# Patient Record
Sex: Female | Born: 1945 | Race: White | Hispanic: No | Smoking: Never smoker
Health system: Southern US, Community
[De-identification: ages and names within clinical notes are randomized; demographics above are authoritative.]

## PROBLEM LIST (undated history)

## (undated) DIAGNOSIS — I350 Nonrheumatic aortic (valve) stenosis: Secondary | ICD-10-CM

## (undated) DIAGNOSIS — I10 Essential (primary) hypertension: Secondary | ICD-10-CM

## (undated) DIAGNOSIS — I2510 Atherosclerotic heart disease of native coronary artery without angina pectoris: Secondary | ICD-10-CM

## (undated) DIAGNOSIS — E785 Hyperlipidemia, unspecified: Secondary | ICD-10-CM

## (undated) DIAGNOSIS — G629 Polyneuropathy, unspecified: Secondary | ICD-10-CM

## (undated) DIAGNOSIS — K219 Gastro-esophageal reflux disease without esophagitis: Secondary | ICD-10-CM

## (undated) DIAGNOSIS — E119 Type 2 diabetes mellitus without complications: Secondary | ICD-10-CM

## (undated) HISTORY — DX: Hyperlipidemia, unspecified: E78.5

## (undated) HISTORY — PX: OTHER SURGICAL HISTORY: SHX169

## (undated) HISTORY — DX: Nonrheumatic aortic (valve) stenosis: I35.0

## (undated) HISTORY — DX: Gastro-esophageal reflux disease without esophagitis: K21.9

## (undated) HISTORY — DX: Atherosclerotic heart disease of native coronary artery without angina pectoris: I25.10

## (undated) HISTORY — DX: Polyneuropathy, unspecified: G62.9

## (undated) HISTORY — DX: Type 2 diabetes mellitus without complications: E11.9

## (undated) HISTORY — DX: Essential (primary) hypertension: I10

---

## 1988-09-20 HISTORY — PX: ABDOMINAL HYSTERECTOMY: SHX81

## 1989-05-21 HISTORY — PX: OTHER SURGICAL HISTORY: SHX169

## 1989-05-21 HISTORY — PX: GALLBLADDER SURGERY: SHX652

## 1998-06-26 ENCOUNTER — Ambulatory Visit (HOSPITAL_COMMUNITY): Admission: RE | Admit: 1998-06-26 | Discharge: 1998-06-26 | Payer: Self-pay | Admitting: Orthopedic Surgery

## 1998-06-26 ENCOUNTER — Encounter: Payer: Self-pay | Admitting: Orthopedic Surgery

## 1998-07-11 ENCOUNTER — Inpatient Hospital Stay (HOSPITAL_COMMUNITY): Admission: AD | Admit: 1998-07-11 | Discharge: 1998-07-17 | Payer: Self-pay | Admitting: Cardiology

## 1998-10-02 ENCOUNTER — Encounter: Payer: Self-pay | Admitting: Cardiology

## 1998-10-02 ENCOUNTER — Ambulatory Visit (HOSPITAL_COMMUNITY): Admission: RE | Admit: 1998-10-02 | Discharge: 1998-10-02 | Payer: Self-pay | Admitting: Cardiology

## 2001-05-10 ENCOUNTER — Emergency Department (HOSPITAL_COMMUNITY): Admission: EM | Admit: 2001-05-10 | Discharge: 2001-05-10 | Payer: Self-pay | Admitting: Emergency Medicine

## 2001-05-10 ENCOUNTER — Encounter: Payer: Self-pay | Admitting: Emergency Medicine

## 2001-06-16 ENCOUNTER — Ambulatory Visit (HOSPITAL_COMMUNITY): Admission: RE | Admit: 2001-06-16 | Discharge: 2001-06-16 | Payer: Self-pay | Admitting: Cardiology

## 2001-06-16 ENCOUNTER — Encounter: Payer: Self-pay | Admitting: Cardiology

## 2002-05-08 ENCOUNTER — Encounter: Payer: Self-pay | Admitting: Internal Medicine

## 2002-05-08 ENCOUNTER — Encounter: Admission: RE | Admit: 2002-05-08 | Discharge: 2002-05-08 | Payer: Self-pay | Admitting: Internal Medicine

## 2006-01-17 ENCOUNTER — Encounter: Admission: RE | Admit: 2006-01-17 | Discharge: 2006-01-17 | Payer: Self-pay | Admitting: Cardiology

## 2006-01-21 ENCOUNTER — Inpatient Hospital Stay (HOSPITAL_COMMUNITY): Admission: RE | Admit: 2006-01-21 | Discharge: 2006-01-30 | Payer: Self-pay | Admitting: Cardiology

## 2006-01-24 HISTORY — PX: CORONARY ARTERY BYPASS GRAFT: SHX141

## 2006-02-24 ENCOUNTER — Encounter: Admission: RE | Admit: 2006-02-24 | Discharge: 2006-02-24 | Payer: Self-pay | Admitting: Cardiothoracic Surgery

## 2010-12-18 ENCOUNTER — Other Ambulatory Visit (HOSPITAL_COMMUNITY): Payer: Self-pay | Admitting: Family Medicine

## 2010-12-18 DIAGNOSIS — R918 Other nonspecific abnormal finding of lung field: Secondary | ICD-10-CM

## 2010-12-28 ENCOUNTER — Inpatient Hospital Stay (HOSPITAL_COMMUNITY): Admission: RE | Admit: 2010-12-28 | Payer: Self-pay | Source: Ambulatory Visit

## 2010-12-28 ENCOUNTER — Inpatient Hospital Stay (HOSPITAL_COMMUNITY)
Admission: RE | Admit: 2010-12-28 | Discharge: 2010-12-28 | Payer: Self-pay | Source: Ambulatory Visit | Attending: Family Medicine | Admitting: Family Medicine

## 2012-10-09 ENCOUNTER — Other Ambulatory Visit (HOSPITAL_COMMUNITY): Payer: Self-pay | Admitting: Internal Medicine

## 2012-10-09 DIAGNOSIS — I10 Essential (primary) hypertension: Secondary | ICD-10-CM

## 2012-10-09 DIAGNOSIS — R011 Cardiac murmur, unspecified: Secondary | ICD-10-CM

## 2012-10-09 DIAGNOSIS — E785 Hyperlipidemia, unspecified: Secondary | ICD-10-CM

## 2012-10-19 ENCOUNTER — Ambulatory Visit (HOSPITAL_COMMUNITY): Payer: Self-pay

## 2012-10-21 DIAGNOSIS — I350 Nonrheumatic aortic (valve) stenosis: Secondary | ICD-10-CM

## 2012-10-21 HISTORY — DX: Nonrheumatic aortic (valve) stenosis: I35.0

## 2012-10-24 ENCOUNTER — Ambulatory Visit (HOSPITAL_COMMUNITY)
Admission: RE | Admit: 2012-10-24 | Discharge: 2012-10-24 | Disposition: A | Discharge status: Home / Self Care | Payer: Medicare Other | Source: Ambulatory Visit | Attending: Internal Medicine | Admitting: Internal Medicine

## 2012-10-24 DIAGNOSIS — I10 Essential (primary) hypertension: Secondary | ICD-10-CM | POA: Insufficient documentation

## 2012-10-24 DIAGNOSIS — I2510 Atherosclerotic heart disease of native coronary artery without angina pectoris: Secondary | ICD-10-CM | POA: Insufficient documentation

## 2012-10-24 DIAGNOSIS — I080 Rheumatic disorders of both mitral and aortic valves: Secondary | ICD-10-CM | POA: Insufficient documentation

## 2012-10-24 DIAGNOSIS — E119 Type 2 diabetes mellitus without complications: Secondary | ICD-10-CM | POA: Insufficient documentation

## 2012-10-24 DIAGNOSIS — R011 Cardiac murmur, unspecified: Secondary | ICD-10-CM | POA: Insufficient documentation

## 2012-10-24 DIAGNOSIS — I369 Nonrheumatic tricuspid valve disorder, unspecified: Secondary | ICD-10-CM | POA: Insufficient documentation

## 2012-10-24 DIAGNOSIS — E785 Hyperlipidemia, unspecified: Secondary | ICD-10-CM

## 2012-10-24 NOTE — Progress Notes (Signed)
2D Echo Performed 10/24/2012    Keir Viernes, RCS  

## 2012-12-21 DIAGNOSIS — M25569 Pain in unspecified knee: Secondary | ICD-10-CM

## 2012-12-21 DIAGNOSIS — M545 Low back pain, unspecified: Secondary | ICD-10-CM | POA: Insufficient documentation

## 2012-12-21 HISTORY — DX: Low back pain, unspecified: M54.50

## 2012-12-21 HISTORY — DX: Pain in unspecified knee: M25.569

## 2013-03-26 ENCOUNTER — Telehealth: Payer: Self-pay | Admitting: Internal Medicine

## 2013-03-26 NOTE — Telephone Encounter (Signed)
Returned call.  Pt stated she has had a virus over the weekend.  Stated starting Saturday, she can hear and feel her heartbeat in her ear and she hasn't heard that since 2007 when she had open heart surgery.  Stated she didn't know if it was r/t the virus or if it was her heart.  Offered appt this afternoon w/ NP and pt declined.  Stated she has a lot to do today and would rather come in tomorrow.  Informed NP will be notified this afternoon and RN will call back once response given.  Pt verbalized understanding and agreed w/ plan.  Chart# [id number].

## 2013-03-26 NOTE — Telephone Encounter (Signed)
Message forwarded to L. Ingold, NP for further instructions.  

## 2013-03-26 NOTE — Telephone Encounter (Signed)
Returned call and informed pt per instructions by MD/PA.  Pt verbalized understanding and agreed w/ plan.  Appt scheduled for 7.8.14 at 10:20am.  Pt agreed to go to ER for worsening symptoms.

## 2013-03-26 NOTE — Telephone Encounter (Signed)
Pt called in stating that she has not felt well and she said that she can hear her heartbeat and it is bothering her. She wanted to know if she needed to come in for an appointment or what could she do about it.

## 2013-03-26 NOTE — Telephone Encounter (Signed)
Can come tomorrow to be seen.  If she becomes worse before then go to ER.

## 2013-03-27 ENCOUNTER — Ambulatory Visit (INDEPENDENT_AMBULATORY_CARE_PROVIDER_SITE_OTHER): Payer: Medicare Other | Admitting: Cardiology

## 2013-03-27 ENCOUNTER — Telehealth: Payer: Self-pay | Admitting: Cardiology

## 2013-03-27 ENCOUNTER — Encounter: Payer: Self-pay | Admitting: Cardiology

## 2013-03-27 VITALS — BP 140/77 | HR 82 | Ht 63.0 in | Wt 184.2 lb

## 2013-03-27 DIAGNOSIS — R079 Chest pain, unspecified: Secondary | ICD-10-CM

## 2013-03-27 DIAGNOSIS — R42 Dizziness and giddiness: Secondary | ICD-10-CM

## 2013-03-27 DIAGNOSIS — R195 Other fecal abnormalities: Secondary | ICD-10-CM

## 2013-03-27 DIAGNOSIS — I2510 Atherosclerotic heart disease of native coronary artery without angina pectoris: Secondary | ICD-10-CM

## 2013-03-27 HISTORY — DX: Other fecal abnormalities: R19.5

## 2013-03-27 HISTORY — DX: Dizziness and giddiness: R42

## 2013-03-27 LAB — BASIC METABOLIC PANEL WITH GFR
BUN: 12 mg/dL (ref 6–23)
CO2: 30 mEq/L (ref 19–32)
Chloride: 104 mEq/L (ref 96–112)
Creat: 0.7 mg/dL (ref 0.50–1.10)
Glucose, Bld: 118 mg/dL — ABNORMAL HIGH (ref 70–99)

## 2013-03-27 LAB — CBC WITH DIFFERENTIAL/PLATELET
Lymphs Abs: 3.1 10*3/uL (ref 0.7–4.0)
MCH: 30.9 pg (ref 26.0–34.0)
MCHC: 31.9 g/dL (ref 30.0–36.0)
MCV: 96.7 fL (ref 78.0–100.0)
Neutro Abs: 7.4 10*3/uL (ref 1.7–7.7)
Neutrophils Relative %: 68 % (ref 43–77)
RDW: 16.9 % — ABNORMAL HIGH (ref 11.5–15.5)
WBC: 10.8 10*3/uL — ABNORMAL HIGH (ref 4.0–10.5)

## 2013-03-27 MED ORDER — MECLIZINE HCL 12.5 MG PO TABS: 12.5000 mg | ORAL_TABLET | Freq: Three times a day (TID) | ORAL | Status: DC | PRN

## 2013-03-27 NOTE — Assessment & Plan Note (Signed)
With viral syndrome this weekend + N,V, diarrhea, and at least one episode of black stool she thought could be Imodium but she  Did not take pepto bismol. Will check CBC

## 2013-03-27 NOTE — Assessment & Plan Note (Signed)
She felt her heart beating in her ears like when she had CABG.  Does have a pounding HR But dizziness associated with movement and recent viral syndrome.  Will add meclizine to her meds.  Will also check labs, she is pale and had black stool on Sat.  If CBC stable and meclizine does not help we will add cardionet monitor. Currently in SR with the symptoms.

## 2013-03-27 NOTE — Telephone Encounter (Signed)
Hgb is 7.7 today, which explains her symptoms.  I have called her home and left a message for her to call us.  I did discuss with Dr. Rennis Golden and we would like her to go to ER. They can then transfuse her.  The closest hospital would be fine, doesn't have to be [facility].   Please try the pt again today.

## 2013-03-27 NOTE — Telephone Encounter (Signed)
I called the [facility]- emergency contact number- and Ms. Bos has gone to [facility] for further treatment per Mr. Aggie Cosier.

## 2013-03-27 NOTE — Progress Notes (Signed)
03/27/2013   PCP: Lise Auer, MD   Chief Complaint  Patient presents with  . Follow-up    feeling like heart is beating in her head, lots of dizziness, and little chest pain, off balance.    Primary Cardiologist:Dr. Hilty  HPI:  67 year old female patient is here today with complaints of hearing her heartbeat in her head.  She also has a lot of dizziness, though she cannot express it was room spinning or just lightheadedness.  Symptoms started last Friday with nausea vomiting and diarrhea he continued through Saturday. She did have one episode of black stool she thought was related to be Imodium. She denies any use of Pepto-Bismol. The dizziness and hearing her heartbeat began Sunday night Monday. She was concerned this may be cardiac the last time she remembers the symptoms were after her bypass grafting.  She has minimal chest pain under her right breast that lasts for just moments.  She may have had mild fever Friday and Saturday with her nausea vomiting and diarrhea.  She has a history of coronary artery disease with bypass grafting in 2007 by Dr. Zenaida Niece try with LIMA to the LAD a vein graft to the diagonal vein graft to the RCA. Last stress test was 2000 1010 a nuclear test that showed an EF 70% no ischemia. Other history as listed below.  On her last visit with Dr. Rennis Golden she was noted to have 2-3/6 systolic murmur and echocardiogram was done.  She had mild aortic stenosis and mild mitral regurg.   Allergies  Allergen Reactions  . Other     Nuts   . Penicillins     Current Outpatient Prescriptions  Medication Sig Dispense Refill  . ALPRAZolam (XANAX) 0.5 MG tablet Take 1 mg by mouth daily.      Marland Kitchen aspirin 325 MG tablet Take 325 mg by mouth daily.      . cholecalciferol (VITAMIN D) 1000 UNITS tablet Take 1,000 Units by mouth daily.      . CRESTOR 20 MG tablet Take 20 mg by mouth daily.      Marland Kitchen DILTIAZEM HCL CD 360 MG 24 hr capsule Take 360 mg by mouth daily.      . fish  oil-omega-3 fatty acids 1000 MG capsule Take 1 g by mouth 3 (three) times daily.      Marland Kitchen gabapentin (NEURONTIN) 300 MG capsule Take 300 mg by mouth 3 (three) times daily.      Marland Kitchen loratadine (CLARITIN) 10 MG tablet Take 10 mg by mouth daily.      . metFORMIN (GLUCOPHAGE-XR) 750 MG 24 hr tablet Take 750 mg by mouth daily.      . metoprolol (LOPRESSOR) 50 MG tablet Take 50 mg by mouth 3 (three) times daily.      . Multiple Vitamin (MULTIVITAMIN WITH MINERALS) TABS Take 1 tablet by mouth daily.      . nitroGLYCERIN (NITROSTAT) 0.4 MG SL tablet Place 0.4 mg under the tongue every 5 (five) minutes as needed for chest pain.      Marland Kitchen tiZANidine (ZANAFLEX) 4 MG tablet Take 4 mg by mouth as needed.      . traMADol (ULTRAM) 50 MG tablet Take 50 mg by mouth as needed.      . traZODone (DESYREL) 150 MG tablet Take 150 mg by mouth as needed.      . meclizine (ANTIVERT) 12.5 MG tablet Take 1 tablet (12.5 mg total) by mouth 3 (three) times daily  as needed.  30 tablet  0   No current facility-administered medications for this visit.    Past Medical History  Diagnosis Date  . Hypertension   . Hyperlipemia   . Diabetes mellitus   . GERD (gastroesophageal reflux disease)   . CAD (coronary artery disease)     hx CABG 2007, LAST NUC EF 70%-no ischemia  . Aortic stenosis, mild 10/2012    mild AS by Echo , mild MR  . Neuropathy     Past Surgical History  Procedure Laterality Date  . Coronary artery bypass graft  01/24/2006    x 3,LIMA-LAD, VG-diag, VG-distal RCA  . Abdominal hysterectomy  1990  . Gallbladder surgery  1990's  . Knee replacment  1990's  . Lung repair      s/p lap chole    ZOX:WRUEAVW: + N,V diarrhea. no weight changes Skin:no rashes or ulcers HEENT:no blurred vision, some congestion CV:see HPI PUL:see HPI GI:+ diarrhea this past weekend, no constipation, ? melena, no indigestion, still with occ nausea GU:no hematuria, no dysuria MS:no joint pain, no claudication Neuro:no syncope, ++  lightheadedness Endo:+ diabetes, no thyroid disease  PHYSICAL EXAM BP 140/77  Pulse 82  Ht 5\' 3"  (1.6 m)  Wt 184 lb 3.2 oz (83.553 kg)  BMI 32.64 kg/m2 General:Pleasant affect, NAD Skin:Warm and dry, brisk capillary refill, pale HEENT:normocephalic, sclera clear, mucus membranes moist, Wax  obscuring rt. TM, Lt TM clear  Neck::supple, no JVD, + catotid bruits bil., carotid arteries are bounding  Heart:S1S2 RRR with 2-3/6 systolic murmur murmur, gallup, rub or click Lungs:clear without rales, rhonchi, or wheezes UJW:JXBJ, non tender, + BS, do not palpate liver spleen or masses Ext:no lower ext edema, 2+ pedal pulses, 2+ radial pulses Neuro:alert and oriented, MAE, follows commands, + facial symmetry  EKG:SR rate of 82 without acute changes.  ASSESSMENT AND PLAN Dizzy She felt her heart beating in her ears like when she had CABG.  Does have a pounding HR But dizziness associated with movement and recent viral syndrome.  Will add meclizine to her meds.  Will also check labs, she is pale and had black stool on Sat.  If CBC stable and meclizine does not help we will add cardionet monitor. Currently in SR with the symptoms.  Dark stools With viral syndrome this weekend + N,V, diarrhea, and at least one episode of black stool she thought could be Imodium but she  Did not take pepto bismol. Will check CBC  CAD (coronary artery disease) occ pain under rt breast, possible GI related with recent vomiting.   I ordered her labs stat hemoglobin came back at 7.7. I called the patient and asked her to go to an emergency room she was in or near [place] so she didn't get around off hospital for further management of GI bleeding.

## 2013-03-27 NOTE — Patient Instructions (Addendum)
Have blood work done this morning  Rest, drink plenty of fluids.  If chest pain continues call us.  Take the meclizine with meals for  Three times today, then as needed.  May make you drowsy.   Call if no improvement.  We may need to add a monitor if symptoms continue and your blood count is ok.  Follow up in 1 week with me.

## 2013-03-27 NOTE — Assessment & Plan Note (Signed)
occ pain under rt breast, possible GI related with recent vomiting.

## 2013-03-28 ENCOUNTER — Encounter: Payer: Self-pay | Admitting: Internal Medicine

## 2013-03-29 ENCOUNTER — Telehealth: Payer: Self-pay | Admitting: Cardiology

## 2013-03-29 NOTE — Telephone Encounter (Signed)
Julie Leblanc is returning your call Nya.

## 2013-04-02 ENCOUNTER — Ambulatory Visit: Payer: Medicare Other | Admitting: Cardiology

## 2013-04-22 ENCOUNTER — Other Ambulatory Visit: Payer: Self-pay | Admitting: Cardiology

## 2013-04-23 NOTE — Telephone Encounter (Signed)
Rx denied, contact PCP

## 2013-08-20 ENCOUNTER — Telehealth: Payer: Self-pay | Admitting: *Deleted

## 2013-08-20 NOTE — Telephone Encounter (Signed)
Please advise 

## 2013-08-20 NOTE — Telephone Encounter (Signed)
Pt is calling in regards to changing her medication. She can no longer afford the Diltizam and wants to know if she can get something cheaper.

## 2013-08-21 ENCOUNTER — Other Ambulatory Visit: Payer: Self-pay | Admitting: Internal Medicine

## 2013-08-21 MED ORDER — DILTIAZEM HCL ER 120 MG PO CP12: 120.0000 mg | ORAL_CAPSULE | Freq: Two times a day (BID) | ORAL | Status: AC

## 2013-08-21 NOTE — Telephone Encounter (Signed)
I changed her to generic Cardizem Sr 120 mg BID - orders in the system.  Please advise her of the change.  Dr. Rennis Golden

## 2013-08-21 NOTE — Telephone Encounter (Signed)
Patient notified of medication change and that is was sent in to the pharmacy.

## 2013-08-21 NOTE — Telephone Encounter (Signed)
Wanting to know what  is going to be done about her Diltiazem?. Want to get this straight in the new few days.Would like something that not so expensive.

## 2013-10-05 ENCOUNTER — Encounter: Payer: Self-pay | Admitting: Internal Medicine

## 2013-10-05 ENCOUNTER — Ambulatory Visit (INDEPENDENT_AMBULATORY_CARE_PROVIDER_SITE_OTHER): Payer: Medicare HMO | Admitting: Internal Medicine

## 2013-10-05 VITALS — BP 120/60 | HR 64 | Ht 62.0 in | Wt 186.2 lb

## 2013-10-05 DIAGNOSIS — E119 Type 2 diabetes mellitus without complications: Secondary | ICD-10-CM | POA: Insufficient documentation

## 2013-10-05 DIAGNOSIS — I359 Nonrheumatic aortic valve disorder, unspecified: Secondary | ICD-10-CM

## 2013-10-05 DIAGNOSIS — E785 Hyperlipidemia, unspecified: Secondary | ICD-10-CM

## 2013-10-05 DIAGNOSIS — I350 Nonrheumatic aortic (valve) stenosis: Secondary | ICD-10-CM

## 2013-10-05 DIAGNOSIS — Z951 Presence of aortocoronary bypass graft: Secondary | ICD-10-CM

## 2013-10-05 DIAGNOSIS — I2510 Atherosclerotic heart disease of native coronary artery without angina pectoris: Secondary | ICD-10-CM

## 2013-10-05 HISTORY — DX: Nonrheumatic aortic (valve) stenosis: I35.0

## 2013-10-05 HISTORY — DX: Type 2 diabetes mellitus without complications: E11.9

## 2013-10-05 HISTORY — DX: Hyperlipidemia, unspecified: E78.5

## 2013-10-05 HISTORY — DX: Presence of aortocoronary bypass graft: Z95.1

## 2013-10-05 NOTE — Patient Instructions (Signed)
Your physician wants you to follow-up in:  6 months. You will receive a reminder letter in the mail two months in advance. If you don't receive a letter, please call our office to schedule the follow-up appointment.   

## 2013-10-05 NOTE — Progress Notes (Signed)
OFFICE NOTE  Chief Complaint:  Left knee pain  Primary Care Physician: Julie Flow, MD  HPI:  Julie Leblanc is a 68 year old female previously followed by Julie Leblanc with a history of CABG in 2007 by Julie Leblanc. This included a LIMA to the LAD, a vein graft to the diagonal and vein graft to the distal RCA. In 2010 she had a nuclear stress test that showed an EF of 70%. No ischemia. She has diabetes and dyslipidemia but good control of her diabetes on oral medications. Recently she had a knee replacement, and she is contemplating a future knee replacement as well. She is totally asymptomatic from a cardiac standpoint, denies any chest pain, worsening shortness of breath, palpitations, presyncope or syncopal symptoms.  This past summer she saw Julie Kicks, FNP, for dizziness and was noted to be having dark stools. Subsequently she was found to have a low hemoglobin and was referred to [facility]. She underwent EGD and colonoscopy and was found to have multiple gastric ulcers which have started to heal based on her report of a recent EGD one month ago. In addition she had a recent fall and landed on her left knee and developed a hematoma. Just has a resolving hematoma on her left cheek. She is still undergoing packing and had incision and drainage of the left knee and is bothered by warmth and swelling of the left lower extremity which was possibly concerning for cellulitis. Other changes recently included switching her diltiazem to the 120 mg SR twice daily, due to insurance changes.  She is tolerating this well and has had good blood pressure control.  PMHx:  Past Medical History  Diagnosis Date  . Hypertension   . Hyperlipemia   . Diabetes mellitus   . GERD (gastroesophageal reflux disease)   . CAD (coronary artery disease)     hx CABG 2007, LAST NUC EF 70%-no ischemia  . Aortic stenosis, mild 10/2012    mild AS by Echo , mild MR  . Neuropathy     Past Surgical History    Procedure Laterality Date  . Coronary artery bypass graft  01/24/2006    x 3,LIMA-LAD, VG-diag, VG-distal RCA  . Abdominal hysterectomy  1990  . Gallbladder surgery  1990's  . Knee replacment  1990's  . Lung repair      s/p lap chole    FAMHx:  Family History  Problem Relation Age of Onset  . Heart disease Mother 77    heart attack  . Heart disease Brother 2    heart Attack  . Diabetes Mother   . Diabetes Brother   . Diabetes Father 36    heart attack    SOCHx:   reports that she has never smoked. She has never used smokeless tobacco. She reports that she does not drink alcohol or use illicit drugs.  ALLERGIES:  Allergies  Allergen Reactions  . Other Anaphylaxis and Swelling    Nuts   . Penicillins     ROS: A comprehensive review of systems was negative except for: Integument/breast: positive for skin color change Musculoskeletal: positive for left knee pain, hematoma  HOME MEDS: Current Outpatient Prescriptions  Medication Sig Dispense Refill  . ALPRAZolam (XANAX) 1 MG tablet Take 1 mg by mouth as needed for anxiety.      Marland Kitchen aspirin 81 MG tablet Take 81 mg by mouth daily.      Marland Kitchen atorvastatin (LIPITOR) 20 MG tablet Take 20 mg by mouth  daily.      . bisacodyl (DULCOLAX) 5 MG EC tablet Take 5 mg by mouth daily as needed for moderate constipation.      . cephALEXin (KEFLEX) 500 MG capsule Take 500 mg by mouth 3 (three) times daily. x10 days      . diltiazem (CARDIZEM SR) 120 MG 12 hr capsule Take 1 capsule (120 mg total) by mouth 2 (two) times daily.  60 capsule  11  . EPINEPHrine (EPI-PEN) 0.3 mg/0.3 mL SOAJ injection Inject 0.3 mg into the muscle once. PRN - nut allergy      . fish oil-omega-3 fatty acids 1000 MG capsule Take 1 g by mouth 3 (three) times daily.      . furosemide (LASIX) 40 MG tablet Take 40 mg by mouth daily as needed.      . gabapentin (NEURONTIN) 300 MG capsule Take 600 mg by mouth 3 (three) times daily.       . Hydrocodone-Acetaminophen (VICODIN)  5-300 MG TABS Take by mouth 2 (two) times daily as needed.      . loratadine (CLARITIN) 10 MG tablet Take 10 mg by mouth daily.      . meloxicam (MOBIC) 7.5 MG tablet Take 1 tablet by mouth daily.      . metFORMIN (GLUCOPHAGE-XR) 750 MG 24 hr tablet Take 750 mg by mouth daily.      . metoprolol (LOPRESSOR) 50 MG tablet Take 50 mg by mouth 3 (three) times daily.      . Multiple Vitamin (MULTIVITAMIN WITH MINERALS) TABS Take 1 tablet by mouth daily.      . nitroGLYCERIN (NITROSTAT) 0.4 MG SL tablet Place 0.4 mg under the tongue every 5 (five) minutes as needed for chest pain.      . pantoprazole (PROTONIX) 40 MG tablet Take 1 tablet by mouth daily.      Marland Kitchen tiZANidine (ZANAFLEX) 4 MG tablet Take 4 mg by mouth as needed.      . traZODone (DESYREL) 150 MG tablet Take 150 mg by mouth as needed.      . venlafaxine XR (EFFEXOR-XR) 75 MG 24 hr capsule Take 1 capsule by mouth daily.       No current facility-administered medications for this visit.    LABS/IMAGING: No results found for this or any previous visit (from the past 48 hour(s)). No results found.  VITALS: BP 120/60  Pulse 64  Ht 5\' 2"  (1.575 m)  Wt 186 lb 3.2 oz (84.46 kg)  BMI 34.05 kg/m2  EXAM: General appearance: alert and no distress Neck: no carotid bruit and no JVD Lungs: clear to auscultation bilaterally Heart: regular rate and rhythm, S1, S2 normal and systolic murmur: early systolic 3/6, crescendo at 2nd right intercostal space Abdomen: soft, non-tender; bowel sounds normal; no masses,  no organomegaly Extremities: left knee is wrapped, packed incision, LLE is erythematous and warm, 1+ LLE edema Pulses: 2+ and symmetric Skin: erythema of the LLE Neurologic: Grossly normal Psych: Pleasant, normal  EKG: Normal sinus rhythm at 64  ASSESSMENT: 1. Coronary artery disease status post three-vessel CABG in 2007 2. Diabetes type 2 3. Dyslipidemia 4. Mild aortic stenosis 5. HTN  PLAN: 1.   [name] is doing fairly  well from a cardiac standpoint. Unfortunately she's been troubled by GI bleeding which has resolved and recent falls with a hematoma. She is only on low-dose aspirin for anticoagulation and I do not see indication for further anticoagulation at this time. She denies any anginal symptoms and  is doing well with good blood pressure control. We will request labs from her primary care provider as to her diabetes and cholesterol control which was tested about 3 months ago in [place]. Most of her hospitalizations have occurred at [facility] and we do not have records of these visits.  Plan to see her back in 6 months.  Pixie Casino, MD, [facility] Attending Cardiologist [facility]  Taelyn Nemes C 10/05/2013, 9:27 AM

## 2014-04-05 ENCOUNTER — Encounter: Payer: Self-pay | Admitting: Internal Medicine

## 2014-04-05 ENCOUNTER — Encounter (HOSPITAL_COMMUNITY): Payer: Self-pay | Admitting: *Deleted

## 2014-04-05 ENCOUNTER — Ambulatory Visit (INDEPENDENT_AMBULATORY_CARE_PROVIDER_SITE_OTHER): Payer: Commercial Managed Care - HMO | Admitting: Internal Medicine

## 2014-04-05 VITALS — BP 180/80 | HR 63 | Ht 62.0 in | Wt 191.7 lb

## 2014-04-05 DIAGNOSIS — Z01810 Encounter for preprocedural cardiovascular examination: Secondary | ICD-10-CM

## 2014-04-05 DIAGNOSIS — I209 Angina pectoris, unspecified: Secondary | ICD-10-CM

## 2014-04-05 DIAGNOSIS — E785 Hyperlipidemia, unspecified: Secondary | ICD-10-CM

## 2014-04-05 DIAGNOSIS — Z951 Presence of aortocoronary bypass graft: Secondary | ICD-10-CM

## 2014-04-05 DIAGNOSIS — I359 Nonrheumatic aortic valve disorder, unspecified: Secondary | ICD-10-CM

## 2014-04-05 DIAGNOSIS — E119 Type 2 diabetes mellitus without complications: Secondary | ICD-10-CM

## 2014-04-05 DIAGNOSIS — I350 Nonrheumatic aortic (valve) stenosis: Secondary | ICD-10-CM

## 2014-04-05 DIAGNOSIS — I25119 Atherosclerotic heart disease of native coronary artery with unspecified angina pectoris: Secondary | ICD-10-CM

## 2014-04-05 DIAGNOSIS — R079 Chest pain, unspecified: Secondary | ICD-10-CM

## 2014-04-05 DIAGNOSIS — I2510 Atherosclerotic heart disease of native coronary artery without angina pectoris: Secondary | ICD-10-CM

## 2014-04-05 HISTORY — DX: Encounter for preprocedural cardiovascular examination: Z01.810

## 2014-04-05 MED ORDER — IRBESARTAN 150 MG PO TABS: 150.0000 mg | ORAL_TABLET | Freq: Every day | ORAL | Status: DC

## 2014-04-05 NOTE — Progress Notes (Signed)
OFFICE NOTE  Chief Complaint:  Uncontrolled HTN, chest pain  Primary Care Physician: Mateo Flow, MD  HPI:  Julie Leblanc is a 68 year old female previously followed by Dr. Rex Kras with a history of CABG in 2007 by Dr. Prescott Gum. This included a LIMA to the LAD, a vein graft to the diagonal and vein graft to the distal RCA. In 2010 she had a nuclear stress test that showed an EF of 70%. No ischemia. She has diabetes and dyslipidemia but good control of her diabetes on oral medications. Recently she had a knee replacement, and she is contemplating a future knee replacement as well. She is totally asymptomatic from a cardiac standpoint, denies any chest pain, worsening shortness of breath, palpitations, presyncope or syncopal symptoms.  This past summer she saw Cecilie Kicks, FNP, for dizziness and was noted to be having dark stools. Subsequently she was found to have a low hemoglobin and was referred to [facility]. She underwent EGD and colonoscopy and was found to have multiple gastric ulcers which have started to heal based on her report of a recent EGD one month ago. In addition she had a recent fall and landed on her left knee and developed a hematoma. Just has a resolving hematoma on her left cheek. She is still undergoing packing and had incision and drainage of the left knee and is bothered by warmth and swelling of the left lower extremity which was possibly concerning for cellulitis. Other changes recently included switching her diltiazem to the 120 mg SR twice daily, due to insurance changes  Mrs. Sudberry returns today for followup. She has responded having problems with uncontrolled hypertension as well as symptoms of stress and anxiety. In addition she's had some chest discomfort which is improved with aspirin. She's been having problems with her left knee as outlined above and then recently was told she needs left knee replacement. The surgery is coming up. I'm concerned about  the fact that she is having some discomfort in her chest and now it appears that she is having. Poorly controlled hypertension. Blood pressures are in the 237S to 283 systolic. Recent lab work demonstrated normal renal function.  PMHx:  Past Medical History  Diagnosis Date  . Hypertension   . Hyperlipemia   . Diabetes mellitus   . GERD (gastroesophageal reflux disease)   . CAD (coronary artery disease)     hx CABG 2007, LAST NUC EF 70%-no ischemia  . Aortic stenosis, mild 10/2012    mild AS by Echo , mild MR  . Neuropathy     Past Surgical History  Procedure Laterality Date  . Coronary artery bypass graft  01/24/2006    x 3,LIMA-LAD, VG-diag, VG-distal RCA  . Abdominal hysterectomy  1990  . Gallbladder surgery  1990's  . Knee replacment  1990's  . Lung repair      s/p lap chole    FAMHx:  Family History  Problem Relation Age of Onset  . Heart disease Mother 54    heart attack  . Heart disease Brother 71    heart Attack  . Diabetes Mother   . Diabetes Brother   . Diabetes Father 85    heart attack    SOCHx:   reports that she has never smoked. She has never used smokeless tobacco. She reports that she does not drink alcohol or use illicit drugs.  ALLERGIES:  Allergies  Allergen Reactions  . Other Anaphylaxis and Swelling    Nuts   .  Penicillins     ROS: A comprehensive review of systems was negative except for: Cardiovascular: positive for chest pain  HOME MEDS: Current Outpatient Prescriptions  Medication Sig Dispense Refill  . ALPRAZolam (XANAX) 1 MG tablet Take 1 mg by mouth as needed for anxiety.      Marland Kitchen aspirin 81 MG tablet Take 81 mg by mouth daily.      Marland Kitchen atorvastatin (LIPITOR) 20 MG tablet Take 20 mg by mouth daily.      . bisacodyl (DULCOLAX) 5 MG EC tablet Take 5 mg by mouth daily as needed for moderate constipation.      Marland Kitchen diltiazem (CARDIZEM SR) 120 MG 12 hr capsule Take 1 capsule (120 mg total) by mouth 2 (two) times daily.  60 capsule  11  .  EPINEPHrine (EPI-PEN) 0.3 mg/0.3 mL SOAJ injection Inject 0.3 mg into the muscle once. PRN - nut allergy      . fish oil-omega-3 fatty acids 1000 MG capsule Take 1 g by mouth 3 (three) times daily.      . furosemide (LASIX) 40 MG tablet Take 40 mg by mouth daily as needed.      . gabapentin (NEURONTIN) 300 MG capsule Take 600 mg by mouth 3 (three) times daily.       . Hydrocodone-Acetaminophen (VICODIN) 5-300 MG TABS Take by mouth 2 (two) times daily as needed.      . loratadine (CLARITIN) 10 MG tablet Take 10 mg by mouth daily.      . Loratadine 10 MG CAPS Take 1 tablet by mouth daily.      . meloxicam (MOBIC) 7.5 MG tablet Take 1 tablet by mouth daily.      . metFORMIN (GLUCOPHAGE-XR) 750 MG 24 hr tablet Take 750 mg by mouth daily.      . metoprolol (LOPRESSOR) 50 MG tablet Take 50 mg by mouth 3 (three) times daily.      . Multiple Vitamin (MULTIVITAMIN WITH MINERALS) TABS Take 1 tablet by mouth daily.      . nitroGLYCERIN (NITROSTAT) 0.4 MG SL tablet Place 0.4 mg under the tongue every 5 (five) minutes as needed for chest pain.      . pantoprazole (PROTONIX) 40 MG tablet Take 1 tablet by mouth daily.      Marland Kitchen tiZANidine (ZANAFLEX) 4 MG tablet Take 4 mg by mouth as needed.      . traZODone (DESYREL) 150 MG tablet Take 150 mg by mouth as needed.      . venlafaxine XR (EFFEXOR-XR) 75 MG 24 hr capsule Take 1 capsule by mouth daily.      . irbesartan (AVAPRO) 150 MG tablet Take 1 tablet (150 mg total) by mouth daily.  30 tablet  6   No current facility-administered medications for this visit.    LABS/IMAGING: No results found for this or any previous visit (from the past 48 hour(s)). No results found.  VITALS: BP 180/80  Pulse 63  Ht 5\' 2"  (1.575 m)  Wt 191 lb 11.2 oz (86.955 kg)  BMI 35.05 kg/m2  EXAM: General appearance: alert and no distress Neck: no carotid bruit and no JVD Lungs: clear to auscultation bilaterally Heart: regular rate and rhythm, S1, S2 normal and systolic murmur:  early systolic 3/6, crescendo at 2nd right intercostal space Abdomen: soft, non-tender; bowel sounds normal; no masses,  no organomegaly Extremities: left knee is wrapped Pulses: 2+ and symmetric Skin: erythema of the LLE Neurologic: Grossly normal Psych: Pleasant, normal  EKG: Normal sinus  rhythm at 63  ASSESSMENT: 1. Coronary artery disease status post three-vessel CABG in 2007 2. Diabetes type 2 3. Dyslipidemia 4. Mild aortic stenosis 5. HTN - uncontrolled 6. Chest pain  PLAN: 1.   Mrs. Filosa is describing some chest pain however infrequently and it does sound atypical. Nonetheless, she is having uncontrolled hypertension which is new and unusual for her. This could be due to ischemia. Her last stress test was in 2010 and has been an additional 5 years since then. She had bypass surgery in 2007 therefore her grafts are getting older. I would recommend a repeat nuclear stress test to further evaluate for ischemia as well as a preoperative evaluation as she is anticipating left knee surgery. Finally, she needs better blood pressure control. I would recommend starting irbesartan 150 mg daily. I will schedule her to see [name] in our hypertension specialists for followup and medication adjustments. We will contact her with the results of her stress test.  Pixie Casino, MD, [facility] Attending Cardiologist [facility]  HILTY,Kenneth C 04/05/2014, 5:16 PM

## 2014-04-05 NOTE — Patient Instructions (Signed)
Your physician has recommended you make the following change in your medication: START irbesartan 150mg  once daily.   Your physician has requested that you have a lexiscan myoview. For further information please visit [email or web address]. Please follow instruction sheet, as given.  Your physician recommends that you schedule a follow-up appointment in: 2 weeks with Krisitn for a BP check.   Your physician wants you to follow-up in: 6 month visit. You will receive a reminder letter in the mail two months in advance. If you don't receive a letter, please call our office to schedule the follow-up appointment.

## 2014-04-16 ENCOUNTER — Other Ambulatory Visit: Payer: Self-pay | Admitting: *Deleted

## 2014-04-16 ENCOUNTER — Telehealth: Payer: Self-pay | Admitting: Internal Medicine

## 2014-04-16 NOTE — Telephone Encounter (Signed)
Try to schedule Julie Leblanc to see [name] for hypertensive management. Have Julie Leblanc keep a record of Julie Leblanc BP's at home and bring Julie Leblanc BP cuff in with Julie Leblanc to the appointment.  Dr. Lemmie Evens

## 2014-04-16 NOTE — Telephone Encounter (Signed)
Today her blood pressure is 222/90.What should she do?

## 2014-04-16 NOTE — Telephone Encounter (Signed)
Patient states her blood pressure today was 222/90 @ 3:45 pm  Yesterday it was 197/99 The past blood pressure have been 150/70-80 range since starting IRBESARTAN 150 MG last week  RN asked patient retake blood pressure now - she got 175/85 she states she feels fine. She was snapping beans, shucking corn today.  Will defer Dr Debara Pickett

## 2014-04-17 ENCOUNTER — Telehealth (HOSPITAL_COMMUNITY): Payer: Self-pay

## 2014-04-17 NOTE — Telephone Encounter (Signed)
Encounter complete. 

## 2014-04-17 NOTE — Telephone Encounter (Signed)
Spoke with patient. Reminded her of appmt with [name]. She has been keeping a log of BPs and will bring them and cuff to appmt on 7/31.

## 2014-04-17 NOTE — Telephone Encounter (Signed)
Patient has appmt with Julie Leblanc on 7/31 for BP check. Scheduled after last OV with Dr. Debara Pickett

## 2014-04-19 ENCOUNTER — Ambulatory Visit (INDEPENDENT_AMBULATORY_CARE_PROVIDER_SITE_OTHER): Payer: Commercial Managed Care - HMO | Admitting: Pharmacist Clinician (PhC)/ Clinical Pharmacy Specialist

## 2014-04-19 ENCOUNTER — Ambulatory Visit (HOSPITAL_COMMUNITY)
Admission: RE | Admit: 2014-04-19 | Discharge: 2014-04-19 | Disposition: A | Discharge status: Home / Self Care | Payer: Medicare HMO | Source: Ambulatory Visit | Attending: Cardiovascular Disease | Admitting: Cardiovascular Disease

## 2014-04-19 ENCOUNTER — Encounter: Payer: Self-pay | Admitting: Pharmacist Clinician (PhC)/ Clinical Pharmacy Specialist

## 2014-04-19 VITALS — Ht 62.0 in | Wt 191.0 lb

## 2014-04-19 VITALS — BP 174/82 | HR 72 | Ht 62.0 in | Wt 189.5 lb

## 2014-04-19 DIAGNOSIS — R079 Chest pain, unspecified: Secondary | ICD-10-CM | POA: Diagnosis present

## 2014-04-19 DIAGNOSIS — R55 Syncope and collapse: Secondary | ICD-10-CM | POA: Insufficient documentation

## 2014-04-19 DIAGNOSIS — I2510 Atherosclerotic heart disease of native coronary artery without angina pectoris: Secondary | ICD-10-CM | POA: Insufficient documentation

## 2014-04-19 DIAGNOSIS — Z951 Presence of aortocoronary bypass graft: Secondary | ICD-10-CM | POA: Diagnosis not present

## 2014-04-19 DIAGNOSIS — R5381 Other malaise: Secondary | ICD-10-CM | POA: Insufficient documentation

## 2014-04-19 DIAGNOSIS — Z01810 Encounter for preprocedural cardiovascular examination: Secondary | ICD-10-CM | POA: Diagnosis not present

## 2014-04-19 DIAGNOSIS — R5383 Other fatigue: Secondary | ICD-10-CM

## 2014-04-19 DIAGNOSIS — I252 Old myocardial infarction: Secondary | ICD-10-CM | POA: Insufficient documentation

## 2014-04-19 DIAGNOSIS — I10 Essential (primary) hypertension: Secondary | ICD-10-CM

## 2014-04-19 DIAGNOSIS — R42 Dizziness and giddiness: Secondary | ICD-10-CM | POA: Diagnosis not present

## 2014-04-19 MED ORDER — TECHNETIUM TC 99M SESTAMIBI GENERIC - CARDIOLITE
30.9000 | Freq: Once | INTRAVENOUS | Status: AC | PRN
  Administered 2014-04-19: 30.9 via INTRAVENOUS

## 2014-04-19 MED ORDER — AMINOPHYLLINE 25 MG/ML IV SOLN
75.0000 mg | Freq: Once | INTRAVENOUS | Status: AC
  Administered 2014-04-19: 75 mg via INTRAVENOUS

## 2014-04-19 MED ORDER — TECHNETIUM TC 99M SESTAMIBI GENERIC - CARDIOLITE
10.6000 | Freq: Once | INTRAVENOUS | Status: AC | PRN
  Administered 2014-04-19: 11 via INTRAVENOUS

## 2014-04-19 MED ORDER — IRBESARTAN 300 MG PO TABS: 300.0000 mg | ORAL_TABLET | Freq: Every day | ORAL | Status: AC

## 2014-04-19 MED ORDER — REGADENOSON 0.4 MG/5ML IV SOLN
0.4000 mg | Freq: Once | INTRAVENOUS | Status: AC
  Administered 2014-04-19: 0.4 mg via INTRAVENOUS

## 2014-04-19 NOTE — Procedures (Signed)
[facility] [address] [place] [address] [phone]  Cardiology Nuclear Med Study  Julie Leblanc is a 68 y.o. female     MRN : [id number]     DOB: [birth year]  Procedure Date: 04/19/2014  Nuclear Med Background Indication for Stress Test:  Evaluation for Ischemia, Surgical Clearance and Graft Patency History:  CAD;MI;CABG X3-2007;Last NUC MPI on 05/15/2009-nonischemia;EF=70%;ECHO-mild aortic stenosis Cardiac Risk Factors: Family History - CAD, Hypertension, Lipids, NIDDM and Overweight  Symptoms:  Chest Pain, Dizziness, Fatigue and Syncope   Nuclear Pre-Procedure Caffeine/Decaff Intake:  1:00am NPO After: 11am   IV Site: L Hand  IV 0.9% NS with Angio Cath:  22g  Chest Size (in):  n/a IV Started by: Rolene Course, RN  Height:    Cup Size: C  BMI:  There is no weight on file to calculate BMI. Weight:      Tech Comments:  n/a    Nuclear Med Study 1 or 2 day study: 1 day  Stress Test Type:  [place] Authorizing Provider:  Lyman Bishop, MD   Resting Radionuclide: Technetium 40m Sestamibi  Resting Radionuclide Dose: 10.6 mCi   Stress Radionuclide:  Technetium 103m Sestamibi  Stress Radionuclide Dose: 30.9 mCi           Stress Protocol Rest HR:69 Stress HR: 89  Rest BP: 211/106 Stress BP: 200/81  Exercise Time (min): n/a METS: n/a          Dose of Adenosine (mg):  n/a Dose of Lexiscan: 0.4 mg  Dose of Atropine (mg): n/a Dose of Dobutamine: n/a mcg/kg/min (at max HR)  Stress Test Technologist: Leane Para, CCT Nuclear Technologist: Imagene Riches, CNMT   Rest Procedure:  Myocardial perfusion imaging was performed at rest 45 minutes following the intravenous administration of Technetium 47m Sestamibi. Stress Procedure:  The patient received IV Lexiscan 0.4 mg over 15-seconds.  Technetium 24m Sestamibi injected IV at 30-seconds.  Patient experienced weakness at 2 min. And 75 mg Aminophylline was  administered.  There were no significant changes with Lexiscan.  Quantitative spect images were obtained after a 45 minute delay.  Transient Ischemic Dilatation (Normal <1.22):  1.11  QGS EDV:  74 ml QGS ESV:  25 ml LV Ejection Fraction: 66%     Rest ECG: NSR with non-specific ST-T wave changes  Stress ECG: No significant ST segment change suggestive of ischemia.  QPS Raw Data Images:  Normal; no motion artifact; normal heart/lung ratio. Stress Images:  There is decreased uptake in the inferior wall. The area is small, the severity is moderate. SSS 5. Rest Images:  Normal homogeneous uptake in all areas of the myocardium. Subtraction (SDS):  These findings are consistent with ischemia. SDS 4 LV Wall Motion:  NL LV Function; NL Wall Motion  Impression Exercise Capacity:  Lexiscan with no exercise. BP Response:  Hypertensive blood pressure response. Clinical Symptoms:  No significant symptoms noted. ECG Impression:  No significant ECG changes with Lexiscan. Comparison with Prior Nuclear Study: Compared to 2010, the previously reported inferolateral scar is not seen and there is a new area of ischemia.   Overall Impression:  Intermediate risk stress nuclear study witha relatively small area of moderate ischemia in the basal inferior wall.   [name], MD  04/19/2014 5:06 PM

## 2014-04-19 NOTE — Progress Notes (Signed)
04/19/2014 Julie Leblanc 03/02/1946 [id number]   HPI:  Julie Leblanc is a 68 y.o. female patient of Dr Julie Leblanc, with a PMH below who presents today for a blood pressure check.  Her family history is significant in that both of her parents and her brother all died after an MI and all were diabetic.  She herself is diabetic and has a history of CABG in 2007.  She is feeling good today, with the exception of her knee.  She is seeing an orthopedist soon and hopes to have TKA before the year is out.   She does not use tobacco or drink alcohol or caffeine.  She does her own cooking, adding salt at the tablet, but only to specific foods.  She uses "light" salt.  Because of the knee pain, she is unable to exercise at this time.  She comes in today with her home BP meter, a [employer].  Her home readings have been from 141/72 to 222/99.  She states the day it was 119 systolic, she spent most of the day shucking corn and snapping beans.  She states compliance with her medications, and took her morning doses of all meds today.     Current Outpatient Prescriptions  Medication Sig Dispense Refill  . ALPRAZolam (XANAX) 1 MG tablet Take 1 mg by mouth as needed for anxiety.      Marland Kitchen aspirin 81 MG tablet Take 81 mg by mouth daily.      Marland Kitchen atorvastatin (LIPITOR) 20 MG tablet Take 20 mg by mouth daily.      . bisacodyl (DULCOLAX) 5 MG EC tablet Take 5 mg by mouth daily as needed for moderate constipation.      Marland Kitchen diltiazem (CARDIZEM SR) 120 MG 12 hr capsule Take 1 capsule (120 mg total) by mouth 2 (two) times daily.  60 capsule  11  . EPINEPHrine (EPI-PEN) 0.3 mg/0.3 mL SOAJ injection Inject 0.3 mg into the muscle once. PRN - nut allergy      . fish oil-omega-3 fatty acids 1000 MG capsule Take 1 g by mouth 3 (three) times daily.      . furosemide (LASIX) 40 MG tablet Take 40 mg by mouth daily as needed.      . gabapentin (NEURONTIN) 300 MG capsule Take 600 mg by mouth 3 (three) times daily.       .  Hydrocodone-Acetaminophen (VICODIN) 5-300 MG TABS Take by mouth 2 (two) times daily as needed.      . irbesartan (AVAPRO) 300 MG tablet Take 1 tablet (300 mg total) by mouth daily.  30 tablet  5  . Loratadine 10 MG CAPS Take 1 tablet by mouth daily.      . meloxicam (MOBIC) 7.5 MG tablet Take 1 tablet by mouth daily.      . metFORMIN (GLUCOPHAGE-XR) 750 MG 24 hr tablet Take 750 mg by mouth daily.      . metoprolol (LOPRESSOR) 50 MG tablet Take 50 mg by mouth 3 (three) times daily.      . Multiple Vitamin (MULTIVITAMIN WITH MINERALS) TABS Take 1 tablet by mouth daily.      . nitroGLYCERIN (NITROSTAT) 0.4 MG SL tablet Place 0.4 mg under the tongue every 5 (five) minutes as needed for chest pain.      . pantoprazole (PROTONIX) 40 MG tablet Take 1 tablet by mouth daily.      Marland Kitchen tiZANidine (ZANAFLEX) 4 MG tablet Take 4 mg by mouth as needed.      Marland Kitchen  traMADol (ULTRAM) 50 MG tablet Take as directed      . traZODone (DESYREL) 150 MG tablet Take 150 mg by mouth as needed.      . venlafaxine XR (EFFEXOR-XR) 75 MG 24 hr capsule Take 1 capsule by mouth daily.       No current facility-administered medications for this visit.    Allergies  Allergen Reactions  . Other Anaphylaxis and Swelling    Nuts   . Penicillins     Past Medical History  Diagnosis Date  . Hypertension   . Hyperlipemia   . Diabetes mellitus   . GERD (gastroesophageal reflux disease)   . CAD (coronary artery disease)     hx CABG 2007, LAST NUC EF 70%-no ischemia  . Aortic stenosis, mild 10/2012    mild AS by Echo , mild MR  . Neuropathy     Blood pressure 174/82, pulse 72, height 5\' 2"  (1.575 m), weight 189 lb 8 oz (85.957 kg).  Standing 158/80   R arm 160/78   ASSESSMENT AND PLAN:  Today Julie Leblanc's blood pressure remains elevated.  Because of her diabetes, her goal is to be <140/90.   I will increase her irbesartan to 300mg  daily and see her again in 1 month.  I have asked her to continue with daily home BP monitoring,  her cuff read within 5 points of the office reading.    Tommy Medal PharmD CPP [facility]

## 2014-04-19 NOTE — Procedures (Signed)
[facility] [address] [place] [address] [phone]  Cardiology Nuclear Med Study  Julie Leblanc is a 68 y.o. female     MRN : [id number]     DOB: Nov 04, 1945  Procedure Date: 04/19/2014  Nuclear Med Background Indication for Stress Test:  Evaluation for Ischemia, Surgical Clearance and Graft Patency History:  CAD;MI;CABG X3-2007;Last NUC MPI on 05/15/2009-nonischemia;EF=70%;ECHO-mild aortic stenosis Cardiac Risk Factors: Family History - CAD, Hypertension, Lipids, NIDDM and Overweight  Symptoms:  Chest Pain, Dizziness, Fatigue and Syncope   Nuclear Pre-Procedure Caffeine/Decaff Intake:  1:00am NPO After: 11am   IV Site: L Hand  IV 0.9% NS with Angio Cath:  22g  Chest Size (in):  n/a IV Started by: Rolene Course, RN  Height: 5\' 2"  (1.575 m)  Cup Size: C  BMI:  Body mass index is 34.93 kg/(m^2). Weight:  191 lb (86.637 kg)   Tech Comments:  n/a    Nuclear Med Study 1 or 2 day study: 1 day  Stress Test Type:  [place] Provider:  Lyman Bishop, MD   Resting Radionuclide: Technetium 67m Sestamibi  Resting Radionuclide Dose: 10.6 mCi   Stress Radionuclide:  Technetium 71m Sestamibi  Stress Radionuclide Dose: 30.9 mCi           Stress Protocol Rest HR:69 Stress HR: 89  Rest BP: 211/106 Stress BP: 200/81  Exercise Time (min): n/a METS: n/a   Predicted Max HR: 153 bpm % Max HR: 58.17 bpm Rate Pressure Product: 18779  Dose of Adenosine (mg):  n/a Dose of Lexiscan: 0.4 mg  Dose of Atropine (mg): n/a Dose of Dobutamine: n/a mcg/kg/min (at max HR)  Stress Test Technologist: Leane Para, CCT Nuclear Technologist: Imagene Riches, CNMT   Rest Procedure:  Myocardial perfusion imaging was performed at rest 45 minutes following the intravenous administration of Technetium 12m Sestamibi. Stress Procedure:  The patient received IV Lexiscan 0.4 mg over 15-seconds.  Technetium 59m Sestamibi injected  IV at 30-seconds.  Patient experienced weakness at 2 min. And 75 mg Aminophylline was administered.  There were no significant changes with Lexiscan.  Quantitative spect images were obtained after a 45 minute delay.  Transient Ischemic Dilatation (Normal <1.22):  1.11  QGS EDV:  74 ml QGS ESV:  25 ml LV Ejection Fraction: 66%

## 2014-04-19 NOTE — Patient Instructions (Signed)
Return for a a follow up appointment in 1 month  Your blood pressure today is elevated at 174/82  Check your blood pressure at home daily and keep record of the readings.  Take your BP meds as follows: continue with diltiazem and metoprolol, increase irbesartan to 300mg  once daily  Bring all of your meds, your BP cuff and your record of home blood pressures to your next appointment.  Exercise as you're able, try to walk approximately 30 minutes per day.  Keep salt intake to a minimum, especially watch canned and prepared boxed foods.  Eat more fresh fruits and vegetables and fewer canned items.  Avoid eating in fast food restaurants.    HOW TO TAKE YOUR BLOOD PRESSURE:   Rest 5 minutes before taking your blood pressure.    Don't smoke or drink caffeinated beverages for at least 30 minutes before.   Take your blood pressure before (not after) you eat.   Sit comfortably with your back supported and both feet on the floor (don't cross your legs).   Elevate your arm to heart level on a table or a desk.   Use the proper sized cuff. It should fit smoothly and snugly around your bare upper arm. There should be enough room to slip a fingertip under the cuff. The bottom edge of the cuff should be 1 inch above the crease of the elbow.   Ideally, take 3 measurements at one sitting and record the average.

## 2014-04-26 ENCOUNTER — Telehealth: Payer: Self-pay | Admitting: Internal Medicine

## 2014-04-26 NOTE — Telephone Encounter (Signed)
Closed encounter °

## 2014-05-10 ENCOUNTER — Ambulatory Visit
Admission: RE | Admit: 2014-05-10 | Discharge: 2014-05-10 | Disposition: A | Discharge status: Home / Self Care | Payer: Commercial Managed Care - HMO | Source: Ambulatory Visit | Attending: Internal Medicine | Admitting: Internal Medicine

## 2014-05-10 ENCOUNTER — Encounter: Payer: Self-pay | Admitting: Interventional Cardiology

## 2014-05-10 ENCOUNTER — Encounter: Payer: Self-pay | Admitting: Internal Medicine

## 2014-05-10 ENCOUNTER — Ambulatory Visit (INDEPENDENT_AMBULATORY_CARE_PROVIDER_SITE_OTHER): Payer: Commercial Managed Care - HMO | Admitting: Internal Medicine

## 2014-05-10 ENCOUNTER — Other Ambulatory Visit: Payer: Self-pay | Admitting: *Deleted

## 2014-05-10 VITALS — BP 175/81 | HR 66 | Ht 62.0 in | Wt 190.0 lb

## 2014-05-10 DIAGNOSIS — R079 Chest pain, unspecified: Secondary | ICD-10-CM

## 2014-05-10 DIAGNOSIS — I10 Essential (primary) hypertension: Secondary | ICD-10-CM

## 2014-05-10 DIAGNOSIS — Z951 Presence of aortocoronary bypass graft: Secondary | ICD-10-CM

## 2014-05-10 DIAGNOSIS — I209 Angina pectoris, unspecified: Secondary | ICD-10-CM

## 2014-05-10 DIAGNOSIS — I359 Nonrheumatic aortic valve disorder, unspecified: Secondary | ICD-10-CM

## 2014-05-10 DIAGNOSIS — Z01818 Encounter for other preprocedural examination: Secondary | ICD-10-CM

## 2014-05-10 DIAGNOSIS — I350 Nonrheumatic aortic (valve) stenosis: Secondary | ICD-10-CM

## 2014-05-10 DIAGNOSIS — R9439 Abnormal result of other cardiovascular function study: Secondary | ICD-10-CM

## 2014-05-10 DIAGNOSIS — I2510 Atherosclerotic heart disease of native coronary artery without angina pectoris: Secondary | ICD-10-CM

## 2014-05-10 DIAGNOSIS — R072 Precordial pain: Secondary | ICD-10-CM

## 2014-05-10 DIAGNOSIS — I25119 Atherosclerotic heart disease of native coronary artery with unspecified angina pectoris: Secondary | ICD-10-CM

## 2014-05-10 DIAGNOSIS — R5381 Other malaise: Secondary | ICD-10-CM

## 2014-05-10 DIAGNOSIS — R5383 Other fatigue: Secondary | ICD-10-CM

## 2014-05-10 DIAGNOSIS — D689 Coagulation defect, unspecified: Secondary | ICD-10-CM

## 2014-05-10 HISTORY — DX: Chest pain, unspecified: R07.9

## 2014-05-10 HISTORY — DX: Abnormal result of other cardiovascular function study: R94.39

## 2014-05-10 HISTORY — DX: Essential (primary) hypertension: I10

## 2014-05-10 NOTE — Patient Instructions (Signed)
Your physician has requested that you have a cardiac catheterization NEXT WEEK. Cardiac catheterization is used to diagnose and/or treat various heart conditions. Doctors may recommend this procedure for a number of different reasons. The most common reason is to evaluate chest pain. Chest pain can be a symptom of coronary artery disease (CAD), and cardiac catheterization can show whether plaque is narrowing or blocking your heart's arteries. This procedure is also used to evaluate the valves, as well as measure the blood flow and oxygen levels in different parts of your heart. For further information please visit [email or web address]. Please follow instruction sheet, as given.  You will need to have blood work & a chest x-ray 3-5 days prior to this procedure.  Please go to [address] do not need an appointment

## 2014-05-10 NOTE — Progress Notes (Signed)
OFFICE NOTE  Chief Complaint:  Uncontrolled HTN, chest pain  Primary Care Physician: Mateo Flow, MD  HPI:  Julie Leblanc is a 68 year old female previously followed by Dr. Rex Kras with a history of CABG in 2007 by Dr. Prescott Gum. This included a LIMA to the LAD, a vein graft to the diagonal and vein graft to the distal RCA. In 2010 she had a nuclear stress test that showed an EF of 70%. No ischemia. She has diabetes and dyslipidemia but good control of her diabetes on oral medications. Recently she had a knee replacement, and she is contemplating a future knee replacement as well. She is totally asymptomatic from a cardiac standpoint, denies any chest pain, worsening shortness of breath, palpitations, presyncope or syncopal symptoms.  This past summer she saw Cecilie Kicks, FNP, for dizziness and was noted to be having dark stools. Subsequently she was found to have a low hemoglobin and was referred to [facility]. She underwent EGD and colonoscopy and was found to have multiple gastric ulcers which have started to heal based on her report of a recent EGD one month ago. In addition she had a recent fall and landed on her left knee and developed a hematoma. Just has a resolving hematoma on her left cheek. She is still undergoing packing and had incision and drainage of the left knee and is bothered by warmth and swelling of the left lower extremity which was possibly concerning for cellulitis. Other changes recently included switching her diltiazem to the 120 mg SR twice daily, due to insurance changes  [name] returns today for followup. She has responded having problems with uncontrolled hypertension as well as symptoms of stress and anxiety. In addition she's had some chest discomfort which is improved with aspirin. She's been having problems with her left knee as outlined above and then recently was told she needs left knee replacement. The surgery is coming up. I'm concerned about  the fact that she is having some discomfort in her chest and now it appears that she is having. Poorly controlled hypertension. Blood pressures are in the 643P to 295 systolic. Recent lab work demonstrated normal renal function.  At her last visit we recommended a stress test preoperatively as she was having some chest discomfort as well. She's also had recently voided controlled hypertension. I started her on irbesartan 150 mg and and followup with our hypertension pharmacist her dose was increased to 300 mg daily. Blood pressure still remained in the 170-180 range. She is complaining of significant left knee pain. She continues to have some mild chest discomfort. Her stress test indicated reversible inferior ischemia, albeit a small area, however this could be implicated in her chest pain symptoms and recent difficult to control hypertension.  PMHx:  Past Medical History  Diagnosis Date  . Hypertension   . Hyperlipemia   . Diabetes mellitus   . GERD (gastroesophageal reflux disease)   . CAD (coronary artery disease)     hx CABG 2007, LAST NUC EF 70%-no ischemia  . Aortic stenosis, mild 10/2012    mild AS by Echo , mild MR  . Neuropathy     Past Surgical History  Procedure Laterality Date  . Coronary artery bypass graft  01/24/2006    x 3,LIMA-LAD, VG-diag, VG-distal RCA  . Abdominal hysterectomy  1990  . Gallbladder surgery  1990's  . Knee replacment  1990's  . Lung repair      s/p lap chole  FAMHx:  Family History  Problem Relation Age of Onset  . Heart disease Mother 59    heart attack  . Heart disease Brother 107    heart Attack  . Diabetes Mother   . Diabetes Brother   . Diabetes Father 25    heart attack    SOCHx:   reports that she has never smoked. She has never used smokeless tobacco. She reports that she does not drink alcohol or use illicit drugs.  ALLERGIES:  Allergies  Allergen Reactions  . Other Anaphylaxis and Swelling    Nuts   . Penicillins      ROS: A comprehensive review of systems was negative except for: Cardiovascular: positive for chest pain  HOME MEDS: Current Outpatient Prescriptions  Medication Sig Dispense Refill  . ALPRAZolam (XANAX) 1 MG tablet Take 1 mg by mouth as needed for anxiety.      Marland Kitchen aspirin 81 MG tablet Take 81 mg by mouth daily.      Marland Kitchen atorvastatin (LIPITOR) 20 MG tablet Take 20 mg by mouth daily.      . bisacodyl (DULCOLAX) 5 MG EC tablet Take 5 mg by mouth daily as needed for moderate constipation.      . ciprofloxacin (CIPRO) 500 MG/5ML (10%) suspension Take by mouth 2 (two) times daily.      Marland Kitchen diltiazem (CARDIZEM SR) 120 MG 12 hr capsule Take 1 capsule (120 mg total) by mouth 2 (two) times daily.  60 capsule  11  . EPINEPHrine (EPI-PEN) 0.3 mg/0.3 mL SOAJ injection Inject 0.3 mg into the muscle once. PRN - nut allergy      . fish oil-omega-3 fatty acids 1000 MG capsule Take 1 g by mouth 3 (three) times daily.      . furosemide (LASIX) 40 MG tablet Take 40 mg by mouth daily as needed.      . gabapentin (NEURONTIN) 300 MG capsule Take 600 mg by mouth 3 (three) times daily.       . Hydrocodone-Acetaminophen (VICODIN) 5-300 MG TABS Take by mouth 2 (two) times daily as needed.      . irbesartan (AVAPRO) 300 MG tablet Take 1 tablet (300 mg total) by mouth daily.  30 tablet  5  . Loratadine 10 MG CAPS Take 1 tablet by mouth daily.      . meloxicam (MOBIC) 7.5 MG tablet Take 1 tablet by mouth daily.      . metFORMIN (GLUCOPHAGE-XR) 750 MG 24 hr tablet Take 750 mg by mouth daily.      . metoprolol (LOPRESSOR) 50 MG tablet Take 50 mg by mouth 3 (three) times daily.      . Multiple Vitamin (MULTIVITAMIN WITH MINERALS) TABS Take 1 tablet by mouth daily.      . nitroGLYCERIN (NITROSTAT) 0.4 MG SL tablet Place 0.4 mg under the tongue every 5 (five) minutes as needed for chest pain.      . pantoprazole (PROTONIX) 40 MG tablet Take 1 tablet by mouth daily.      Marland Kitchen tiZANidine (ZANAFLEX) 4 MG tablet Take 4 mg by mouth  as needed.      . traMADol (ULTRAM) 50 MG tablet Take as directed      . traZODone (DESYREL) 150 MG tablet Take 150 mg by mouth as needed.      . venlafaxine XR (EFFEXOR-XR) 75 MG 24 hr capsule Take 1 capsule by mouth daily.       No current facility-administered medications for this visit.  LABS/IMAGING: No results found for this or any previous visit (from the past 48 hour(s)). No results found.  VITALS: BP 175/81  Pulse 66  Ht 5\' 2"  (1.575 m)  Wt 190 lb (86.183 kg)  BMI 34.74 kg/m2  EXAM: deferred  EKG: Deferred   ASSESSMENT: 1. Coronary artery disease status post three-vessel CABG in 2007  2. Diabetes type 2 3. Dyslipidemia 4. Mild aortic stenosis 5. HTN - uncontrolled 6. Chest pain- abnormal nuclear stress test  PLAN: 1.   Mrs. Janco continues to have difficult to control hypertension. Her stress test did show reversible inferior ischemia. Concerned about partially occluded graft or perhaps worsening underlying coronary disease. I would recommend repeat heart catheterization. Unfortunately I'm out of town next week however like to try to arrange it with one of my partners. She is agreeable to this. Followup can be with me after heart catheterization.  Pixie Casino, MD, [facility] Attending Cardiologist [facility]  HILTY,Kenneth C 05/10/2014, 1:29 PM

## 2014-05-11 LAB — PROTIME-INR
INR: 0.95 (ref <1.50)
PROTHROMBIN TIME: 12.7 s (ref 11.6–15.2)

## 2014-05-11 LAB — BASIC METABOLIC PANEL
BUN: 10 mg/dL (ref 6–23)
CHLORIDE: 106 meq/L (ref 96–112)
CO2: 25 mEq/L (ref 19–32)
CREATININE: 0.61 mg/dL (ref 0.50–1.10)
Calcium: 9.4 mg/dL (ref 8.4–10.5)
Glucose, Bld: 94 mg/dL (ref 70–99)
Potassium: 4.4 mEq/L (ref 3.5–5.3)
Sodium: 142 mEq/L (ref 135–145)

## 2014-05-11 LAB — TSH: TSH: 1.712 u[IU]/mL (ref 0.350–4.500)

## 2014-05-11 LAB — CBC
HCT: 37.6 % (ref 36.0–46.0)
HEMOGLOBIN: 12.5 g/dL (ref 12.0–15.0)
MCH: 30.7 pg (ref 26.0–34.0)
MCHC: 33.2 g/dL (ref 30.0–36.0)
MCV: 92.4 fL (ref 78.0–100.0)
Platelets: 230 10*3/uL (ref 150–400)
RBC: 4.07 MIL/uL (ref 3.87–5.11)
RDW: 14.2 % (ref 11.5–15.5)
WBC: 6.6 10*3/uL (ref 4.0–10.5)

## 2014-05-11 LAB — APTT: APTT: 36 s (ref 24–37)

## 2014-05-13 ENCOUNTER — Encounter (HOSPITAL_COMMUNITY): Payer: Self-pay | Admitting: Pharmacy Technician

## 2014-05-17 ENCOUNTER — Encounter (HOSPITAL_COMMUNITY)
Admission: RE | Disposition: A | Discharge status: Home / Self Care | Payer: Self-pay | Source: Ambulatory Visit | Attending: Interventional Cardiology

## 2014-05-17 ENCOUNTER — Ambulatory Visit (HOSPITAL_COMMUNITY)
Admission: RE | Admit: 2014-05-17 | Discharge: 2014-05-17 | Disposition: A | Discharge status: Home / Self Care | Payer: Medicare HMO | Source: Ambulatory Visit | Attending: Interventional Cardiology | Admitting: Interventional Cardiology

## 2014-05-17 ENCOUNTER — Ambulatory Visit: Payer: Commercial Managed Care - HMO | Admitting: Pharmacist Clinician (PhC)/ Clinical Pharmacy Specialist

## 2014-05-17 DIAGNOSIS — I10 Essential (primary) hypertension: Secondary | ICD-10-CM

## 2014-05-17 DIAGNOSIS — Z7982 Long term (current) use of aspirin: Secondary | ICD-10-CM | POA: Diagnosis not present

## 2014-05-17 DIAGNOSIS — K219 Gastro-esophageal reflux disease without esophagitis: Secondary | ICD-10-CM | POA: Insufficient documentation

## 2014-05-17 DIAGNOSIS — E785 Hyperlipidemia, unspecified: Secondary | ICD-10-CM | POA: Insufficient documentation

## 2014-05-17 DIAGNOSIS — Z792 Long term (current) use of antibiotics: Secondary | ICD-10-CM | POA: Diagnosis not present

## 2014-05-17 DIAGNOSIS — Z951 Presence of aortocoronary bypass graft: Secondary | ICD-10-CM | POA: Insufficient documentation

## 2014-05-17 DIAGNOSIS — I739 Peripheral vascular disease, unspecified: Secondary | ICD-10-CM

## 2014-05-17 DIAGNOSIS — I2510 Atherosclerotic heart disease of native coronary artery without angina pectoris: Secondary | ICD-10-CM | POA: Diagnosis not present

## 2014-05-17 DIAGNOSIS — E119 Type 2 diabetes mellitus without complications: Secondary | ICD-10-CM | POA: Insufficient documentation

## 2014-05-17 DIAGNOSIS — R9439 Abnormal result of other cardiovascular function study: Secondary | ICD-10-CM | POA: Insufficient documentation

## 2014-05-17 DIAGNOSIS — I25119 Atherosclerotic heart disease of native coronary artery with unspecified angina pectoris: Secondary | ICD-10-CM

## 2014-05-17 DIAGNOSIS — Z01818 Encounter for other preprocedural examination: Secondary | ICD-10-CM

## 2014-05-17 HISTORY — PX: LEFT HEART CATHETERIZATION WITH CORONARY/GRAFT ANGIOGRAM: SHX5450

## 2014-05-17 LAB — GLUCOSE, CAPILLARY
GLUCOSE-CAPILLARY: 94 mg/dL (ref 70–99)
Glucose-Capillary: 111 mg/dL — ABNORMAL HIGH (ref 70–99)

## 2014-05-17 SURGERY — LEFT HEART CATHETERIZATION WITH CORONARY/GRAFT ANGIOGRAM
Anesthesia: LOCAL

## 2014-05-17 MED ORDER — ASPIRIN 81 MG PO CHEW
CHEWABLE_TABLET | ORAL | Status: AC
  Filled 2014-05-17: qty 1

## 2014-05-17 MED ORDER — METOPROLOL TARTRATE 1 MG/ML IV SOLN
INTRAVENOUS | Status: AC
  Filled 2014-05-17: qty 5

## 2014-05-17 MED ORDER — MORPHINE SULFATE 2 MG/ML IJ SOLN
2.0000 mg | INTRAMUSCULAR | Status: DC | PRN
  Administered 2014-05-17: 2 mg via INTRAVENOUS

## 2014-05-17 MED ORDER — SODIUM CHLORIDE 0.9 % IJ SOLN: 3.0000 mL | INTRAMUSCULAR | Status: DC | PRN

## 2014-05-17 MED ORDER — MORPHINE SULFATE 2 MG/ML IJ SOLN
INTRAMUSCULAR | Status: AC
  Filled 2014-05-17: qty 1

## 2014-05-17 MED ORDER — METFORMIN HCL ER 750 MG PO TB24: 750.0000 mg | ORAL_TABLET | Freq: Every day | ORAL | Status: DC

## 2014-05-17 MED ORDER — SODIUM CHLORIDE 0.9 % IV SOLN: 1.0000 mL/kg/h | INTRAVENOUS | Status: DC

## 2014-05-17 MED ORDER — HEPARIN (PORCINE) IN NACL 2-0.9 UNIT/ML-% IJ SOLN
INTRAMUSCULAR | Status: AC
  Filled 2014-05-17: qty 1500

## 2014-05-17 MED ORDER — MIDAZOLAM HCL 2 MG/2ML IJ SOLN
INTRAMUSCULAR | Status: AC
  Filled 2014-05-17: qty 2

## 2014-05-17 MED ORDER — SODIUM CHLORIDE 0.9 % IV SOLN
INTRAVENOUS | Status: DC
  Administered 2014-05-17: 08:00:00 via INTRAVENOUS

## 2014-05-17 MED ORDER — NITROGLYCERIN 1 MG/10 ML FOR IR/CATH LAB
INTRA_ARTERIAL | Status: AC
  Filled 2014-05-17: qty 10

## 2014-05-17 MED ORDER — HYDRALAZINE HCL 20 MG/ML IJ SOLN
INTRAMUSCULAR | Status: AC
  Filled 2014-05-17: qty 1

## 2014-05-17 MED ORDER — FENTANYL CITRATE 0.05 MG/ML IJ SOLN
INTRAMUSCULAR | Status: AC
  Filled 2014-05-17: qty 2

## 2014-05-17 MED ORDER — ASPIRIN 81 MG PO CHEW
81.0000 mg | CHEWABLE_TABLET | ORAL | Status: AC
  Administered 2014-05-17: 81 mg via ORAL

## 2014-05-17 MED ORDER — HYDRALAZINE HCL 20 MG/ML IJ SOLN
10.0000 mg | INTRAMUSCULAR | Status: DC | PRN
  Administered 2014-05-17: 10 mg via INTRAVENOUS

## 2014-05-17 MED ORDER — LIDOCAINE HCL (PF) 1 % IJ SOLN
INTRAMUSCULAR | Status: AC
  Filled 2014-05-17: qty 30

## 2014-05-17 NOTE — Discharge Instructions (Signed)
Follow post cath instructionsAngiogram, Care After Refer to this sheet in the next few weeks. These instructions provide you with information on caring for yourself after your procedure. Your health care provider may also give you more specific instructions. Your treatment has been planned according to current medical practices, but problems sometimes occur. Call your health care provider if you have any problems or questions after your procedure.  WHAT TO EXPECT AFTER THE PROCEDURE After your procedure, it is typical to have the following sensations:  Minor discomfort or tenderness and a small bump at the catheter insertion site. The bump should usually decrease in size and tenderness within 1 to 2 weeks.  Any bruising will usually fade within 2 to 4 weeks. HOME CARE INSTRUCTIONS   You may need to keep taking blood thinners if they were prescribed for you. Take medicines only as directed by your health care provider.  Do not apply powder or lotion to the site.  Do not take baths, swim, or use a hot tub until your health care provider approves.  You may shower 24 hours after the procedure. Remove the bandage (dressing) and gently wash the site with plain soap and water. Gently pat the site dry.  Inspect the site at least twice daily.  Limit your activity for the first 48 hours. Do not bend, squat, or lift anything over 20 lb (9 kg) or as directed by your health care provider.  Plan to have someone take you home after the procedure. Follow instructions about when you can drive or return to work. SEEK MEDICAL CARE IF:  You get light-headed when standing up.  You have drainage (other than a small amount of blood on the dressing).  You have chills.  You have a fever.  You have redness, warmth, swelling, or pain at the insertion site. SEEK IMMEDIATE MEDICAL CARE IF:   You develop chest pain or shortness of breath, feel faint, or pass out.  You have bleeding, swelling larger than a  walnut, or drainage from the catheter insertion site.  You develop pain, discoloration, coldness, or severe bruising in the leg or arm that held the catheter.  You have heavy bleeding from the site. If this happens, hold pressure on the site. CALL 911 MAKE SURE YOU:  Understand these instructions.  Will watch your condition.  Will get help right away if you are not doing well or get worse. Document Released: 03/25/2005 Document Revised: 01/21/2014 Document Reviewed: 01/29/2013 [facility] Patient Information 2015 [place], [place]. This information is not intended to replace advice given to you by your health care provider. Make sure you discuss any questions you have with your health care provider.

## 2014-05-17 NOTE — Progress Notes (Signed)
Site area: rt groin Site Prior to Removal:  Level 0 Pressure Applied For: 20 minutes Manual:   yes Patient Status During Pull:  stable Post Pull Site:  Level 0 Post Pull Instructions Given:  yes Post Pull Pulses Present: yes Dressing Applied:  tegaderm Bedrest begins @ 1100 Comments: no complications

## 2014-05-17 NOTE — CV Procedure (Addendum)
    PROCEDURE:  Left heart catheterization with selective coronary angiography, left ventriculogram.  Bypass graft angiography.  Abdominal aortogram.  INDICATIONS:  Abnormal stress test,. Preoperative evaluation, hypertension  The risks, benefits, and details of the procedure were explained to the patient.  The patient verbalized understanding and wanted to proceed.  Informed written consent was obtained.  PROCEDURE TECHNIQUE:  After Xylocaine anesthesia a 46F sheath was placed in the right femoral artery with a single anterior needle wall stick.   Left coronary angiography was done using a Judkins L4 guide catheter.  Right coronary angiography was done using a no torque right guide catheter. The IMA was engaged with an IMA catheter. The SVG to diagonal was engaged with an LCB catheter. The SVG to RCA was engaged with a JR 4 catheter. Left ventriculography was done using a pigtail catheter. The pigtail catheter was withdrawn to the abdominal aorta and a power injection of contrast was performed in the AP projection. The sheath will be removed using manual compression for hemostasis.   CONTRAST:  Total of 115 cc.  COMPLICATIONS:  None.    HEMODYNAMICS:  Aortic pressure was 184/84; LV pressure was 181/6; LVEDP 17.  There was no gradient between the left ventricle and aorta.    ANGIOGRAPHIC DATA:   The left main coronary artery is widely patent.  The left anterior descending artery is a large vessel which wraps around the apex. In the mid section, there is a segment of moderate disease. There is an aneurysmal segment after the area of moderate disease. The mid to distal LAD appears widely patent.  There is no significant competitive flow in the LAD noted.  The LIMA to LAD is atretic proximally. There is some flow into the LAD.  The SVG to diagonal is widely patent.  There is significant back flow into the LAD. This diagonal appears to originate off of the ectatic segment.  The left circumflex artery  is a large vessel. There is no significant obstructive disease proximally. There is minimal atherosclerosis proximally. There is a large OM1 which is widely patent. The remainder of the circumflex is medium size and widely patent.  The right coronary artery is occluded in the midportion. The SVG to distal RCA is widely patent.  LEFT VENTRICULOGRAM:  Left ventricular angiogram was done in the 30 RAO projection and revealed normal left ventricular wall motion and systolic function with an estimated ejection fraction of 65%.  LVEDP was 17 mmHg.  ABDOMINAL AORTOGRAM: There is no abdominal aortic aneurysm. There are single renal arteries bilaterally which are widely patent.  IMPRESSIONS:  1. Normal left main coronary artery. 2. Moderate disease in the mid left anterior descending artery which does not appear to be significant angiographically. The LIMA to LAD is atretic. There is also backfilling of the LAD from the SVG to diagonal. 3. Widely patent left circumflex artery and its branches. 4. Occluded mid right coronary artery.  Patent SVG to RCA. 5. Normal left ventricular systolic function.  LVEDP 17 mmHg.  Ejection fraction 65%. 6.  No abdominal aortic aneurysm. No renal artery stenosis.  RECOMMENDATION:  Continue medical therapy. The anterior wall did not show any ischemia on her stress test. I don't think the LAD lesion is significant and this is probably why the LIMA did not mature.  Medical therapy for hypertension. She will followup with Dr. Debara Pickett.

## 2014-05-17 NOTE — Interval H&P Note (Signed)
Cath Lab Visit (complete for each Cath Lab visit)  Clinical Evaluation Leading to the Procedure:   ACS: No.  Non-ACS:    Anginal Classification: CCS II  Anti-ischemic medical therapy: Maximal Therapy (2 or more classes of medications)  Non-Invasive Test Results: Intermediate-risk stress test findings: cardiac mortality 1-3%/year  Prior CABG: Previous CABG      History and Physical Interval Note:  05/17/2014 9:29 AM  Julie Leblanc  has presented today for surgery, with the diagnosis of abnormal nuclear stress test  The various methods of treatment have been discussed with the patient and family. After consideration of risks, benefits and other options for treatment, the patient has consented to  Procedure(s): LEFT HEART CATHETERIZATION WITH CORONARY/GRAFT ANGIOGRAM (N/A) as a surgical intervention .  The patient's history has been reviewed, patient examined, no change in status, stable for surgery.  I have reviewed the patient's chart and labs.  Questions were answered to the patient's satisfaction.     Simi Briel S.

## 2014-05-17 NOTE — H&P (View-Only) (Signed)
OFFICE NOTE  Chief Complaint:  Uncontrolled HTN, chest pain  Primary Care Physician: Mateo Flow, MD  HPI:  Julie Leblanc is a 68 year old female previously followed by Dr. Rex Kras with a history of CABG in 2007 by Dr. Prescott Gum. This included a LIMA to the LAD, a vein graft to the diagonal and vein graft to the distal RCA. In 2010 she had a nuclear stress test that showed an EF of 70%. No ischemia. She has diabetes and dyslipidemia but good control of her diabetes on oral medications. Recently she had a knee replacement, and she is contemplating a future knee replacement as well. She is totally asymptomatic from a cardiac standpoint, denies any chest pain, worsening shortness of breath, palpitations, presyncope or syncopal symptoms.  This past summer she saw Cecilie Kicks, FNP, for dizziness and was noted to be having dark stools. Subsequently she was found to have a low hemoglobin and was referred to [facility]. She underwent EGD and colonoscopy and was found to have multiple gastric ulcers which have started to heal based on her report of a recent EGD one month ago. In addition she had a recent fall and landed on her left knee and developed a hematoma. Just has a resolving hematoma on her left cheek. She is still undergoing packing and had incision and drainage of the left knee and is bothered by warmth and swelling of the left lower extremity which was possibly concerning for cellulitis. Other changes recently included switching her diltiazem to the 120 mg SR twice daily, due to insurance changes  Mrs. Brecht returns today for followup. She has responded having problems with uncontrolled hypertension as well as symptoms of stress and anxiety. In addition she's had some chest discomfort which is improved with aspirin. She's been having problems with her left knee as outlined above and then recently was told she needs left knee replacement. The surgery is coming up. I'm concerned about  the fact that she is having some discomfort in her chest and now it appears that she is having. Poorly controlled hypertension. Blood pressures are in the 856D to 149 systolic. Recent lab work demonstrated normal renal function.  At her last visit we recommended a stress test preoperatively as she was having some chest discomfort as well. She's also had recently voided controlled hypertension. I started her on irbesartan 150 mg and and followup with our hypertension pharmacist her dose was increased to 300 mg daily. Blood pressure still remained in the 170-180 range. She is complaining of significant left knee pain. She continues to have some mild chest discomfort. Her stress test indicated reversible inferior ischemia, albeit a small area, however this could be implicated in her chest pain symptoms and recent difficult to control hypertension.  PMHx:  Past Medical History  Diagnosis Date  . Hypertension   . Hyperlipemia   . Diabetes mellitus   . GERD (gastroesophageal reflux disease)   . CAD (coronary artery disease)     hx CABG 2007, LAST NUC EF 70%-no ischemia  . Aortic stenosis, mild 10/2012    mild AS by Echo , mild MR  . Neuropathy     Past Surgical History  Procedure Laterality Date  . Coronary artery bypass graft  01/24/2006    x 3,LIMA-LAD, VG-diag, VG-distal RCA  . Abdominal hysterectomy  1990  . Gallbladder surgery  1990's  . Knee replacment  1990's  . Lung repair      s/p lap chole  FAMHx:  Family History  Problem Relation Age of Onset  . Heart disease Mother 79    heart attack  . Heart disease Brother 68    heart Attack  . Diabetes Mother   . Diabetes Brother   . Diabetes Father 41    heart attack    SOCHx:   reports that she has never smoked. She has never used smokeless tobacco. She reports that she does not drink alcohol or use illicit drugs.  ALLERGIES:  Allergies  Allergen Reactions  . Other Anaphylaxis and Swelling    Nuts   . Penicillins      ROS: A comprehensive review of systems was negative except for: Cardiovascular: positive for chest pain  HOME MEDS: Current Outpatient Prescriptions  Medication Sig Dispense Refill  . ALPRAZolam (XANAX) 1 MG tablet Take 1 mg by mouth as needed for anxiety.      Marland Kitchen aspirin 81 MG tablet Take 81 mg by mouth daily.      Marland Kitchen atorvastatin (LIPITOR) 20 MG tablet Take 20 mg by mouth daily.      . bisacodyl (DULCOLAX) 5 MG EC tablet Take 5 mg by mouth daily as needed for moderate constipation.      . ciprofloxacin (CIPRO) 500 MG/5ML (10%) suspension Take by mouth 2 (two) times daily.      Marland Kitchen diltiazem (CARDIZEM SR) 120 MG 12 hr capsule Take 1 capsule (120 mg total) by mouth 2 (two) times daily.  60 capsule  11  . EPINEPHrine (EPI-PEN) 0.3 mg/0.3 mL SOAJ injection Inject 0.3 mg into the muscle once. PRN - nut allergy      . fish oil-omega-3 fatty acids 1000 MG capsule Take 1 g by mouth 3 (three) times daily.      . furosemide (LASIX) 40 MG tablet Take 40 mg by mouth daily as needed.      . gabapentin (NEURONTIN) 300 MG capsule Take 600 mg by mouth 3 (three) times daily.       . Hydrocodone-Acetaminophen (VICODIN) 5-300 MG TABS Take by mouth 2 (two) times daily as needed.      . irbesartan (AVAPRO) 300 MG tablet Take 1 tablet (300 mg total) by mouth daily.  30 tablet  5  . Loratadine 10 MG CAPS Take 1 tablet by mouth daily.      . meloxicam (MOBIC) 7.5 MG tablet Take 1 tablet by mouth daily.      . metFORMIN (GLUCOPHAGE-XR) 750 MG 24 hr tablet Take 750 mg by mouth daily.      . metoprolol (LOPRESSOR) 50 MG tablet Take 50 mg by mouth 3 (three) times daily.      . Multiple Vitamin (MULTIVITAMIN WITH MINERALS) TABS Take 1 tablet by mouth daily.      . nitroGLYCERIN (NITROSTAT) 0.4 MG SL tablet Place 0.4 mg under the tongue every 5 (five) minutes as needed for chest pain.      . pantoprazole (PROTONIX) 40 MG tablet Take 1 tablet by mouth daily.      Marland Kitchen tiZANidine (ZANAFLEX) 4 MG tablet Take 4 mg by mouth  as needed.      . traMADol (ULTRAM) 50 MG tablet Take as directed      . traZODone (DESYREL) 150 MG tablet Take 150 mg by mouth as needed.      . venlafaxine XR (EFFEXOR-XR) 75 MG 24 hr capsule Take 1 capsule by mouth daily.       No current facility-administered medications for this visit.  LABS/IMAGING: No results found for this or any previous visit (from the past 48 hour(s)). No results found.  VITALS: BP 175/81  Pulse 66  Ht 5\' 2"  (1.575 m)  Wt 190 lb (86.183 kg)  BMI 34.74 kg/m2  EXAM: deferred  EKG: Deferred   ASSESSMENT: 1. Coronary artery disease status post three-vessel CABG in 2007  2. Diabetes type 2 3. Dyslipidemia 4. Mild aortic stenosis 5. HTN - uncontrolled 6. Chest pain- abnormal nuclear stress test  PLAN: 1.   Mrs. Kachel continues to have difficult to control hypertension. Her stress test did show reversible inferior ischemia. Concerned about partially occluded graft or perhaps worsening underlying coronary disease. I would recommend repeat heart catheterization. Unfortunately I'm out of town next week however like to try to arrange it with one of my partners. She is agreeable to this. Followup can be with me after heart catheterization.  Pixie Casino, MD, [facility] Attending Cardiologist [facility]  Nissim Fleischer C 05/10/2014, 1:29 PM

## 2014-05-20 ENCOUNTER — Encounter: Payer: Commercial Managed Care - HMO | Admitting: Pharmacist Clinician (PhC)/ Clinical Pharmacy Specialist

## 2014-05-24 ENCOUNTER — Telehealth: Payer: Self-pay | Admitting: Internal Medicine

## 2014-05-24 NOTE — Telephone Encounter (Signed)
Closed encounter °

## 2014-06-14 ENCOUNTER — Ambulatory Visit (INDEPENDENT_AMBULATORY_CARE_PROVIDER_SITE_OTHER): Payer: Commercial Managed Care - HMO | Admitting: Physician Assistant

## 2014-06-14 ENCOUNTER — Encounter: Payer: Self-pay | Admitting: Physician Assistant

## 2014-06-14 VITALS — BP 140/80 | HR 61 | Ht 63.0 in | Wt 193.3 lb

## 2014-06-14 DIAGNOSIS — I2510 Atherosclerotic heart disease of native coronary artery without angina pectoris: Secondary | ICD-10-CM

## 2014-06-14 DIAGNOSIS — I359 Nonrheumatic aortic valve disorder, unspecified: Secondary | ICD-10-CM

## 2014-06-14 DIAGNOSIS — L02419 Cutaneous abscess of limb, unspecified: Secondary | ICD-10-CM

## 2014-06-14 DIAGNOSIS — Z951 Presence of aortocoronary bypass graft: Secondary | ICD-10-CM

## 2014-06-14 DIAGNOSIS — I350 Nonrheumatic aortic (valve) stenosis: Secondary | ICD-10-CM

## 2014-06-14 DIAGNOSIS — L03116 Cellulitis of left lower limb: Secondary | ICD-10-CM

## 2014-06-14 DIAGNOSIS — E669 Obesity, unspecified: Secondary | ICD-10-CM

## 2014-06-14 DIAGNOSIS — E66811 Obesity, class 1: Secondary | ICD-10-CM

## 2014-06-14 DIAGNOSIS — L03119 Cellulitis of unspecified part of limb: Secondary | ICD-10-CM

## 2014-06-14 DIAGNOSIS — I10 Essential (primary) hypertension: Secondary | ICD-10-CM

## 2014-06-14 DIAGNOSIS — L03115 Cellulitis of right lower limb: Secondary | ICD-10-CM

## 2014-06-14 HISTORY — DX: Obesity, unspecified: E66.9

## 2014-06-14 HISTORY — DX: Obesity, class 1: E66.811

## 2014-06-14 NOTE — Assessment & Plan Note (Signed)
Blood pressure is better today with increased irbesartan

## 2014-06-14 NOTE — Assessment & Plan Note (Signed)
The LIMA to LAD is atretic. There is also backfilling of the LAD from the SVG to diagonal. Patent SVG to RCA.

## 2014-06-14 NOTE — Assessment & Plan Note (Signed)
Discussed reduced carbohydrate intake. She admits that this is difficult for her. Also, limited mobility due to her knee is limiting exercise.

## 2014-06-14 NOTE — Assessment & Plan Note (Signed)
See Cath note Above. No culprit lesion to explain stress test results. Moderate Mid lad lesion not thought to be significant.  Patient is not complaining of any angina.  I think she would be low risk for knee surgery. On aspirin, Lipitor, Lopressor.

## 2014-06-14 NOTE — Progress Notes (Signed)
Date:  06/17/2014   ID:  Julie Leblanc, DOB 24-Feb-1946, MRN [id number]  PCP:  Mateo Flow, MD  Primary Cardiologist:  HIlty    History of Present Illness: Julie Leblanc is a 68 y.o. female previously followed by Dr. Rex Kras with a history of CABG in 2007 by Dr. Prescott Gum. This included a LIMA to the LAD, a vein graft to the diagonal and vein graft to the distal RCA. In 2010 she had a nuclear stress test that showed an EF of 70%. No ischemia. She has diabetes and dyslipidemia but good control of her diabetes on oral medications. Recently she had a knee replacement, and she is contemplating a future knee replacement as well.  This past summer she saw Cecilie Kicks, FNP, for dizziness and was noted to be having dark stools. Subsequently she was found to have a low hemoglobin and was referred to [facility]. She underwent EGD and colonoscopy and was found to have multiple gastric ulcers which have started to heal based on her report of a recent EGD one month ago. In addition she had a recent fall and landed on her left knee and developed a hematoma.   Dr Debara Pickett started her on irbesartan 150 mg and followup with our hypertension pharmacist her dose was increased to 300 mg daily. Her stress test indicated reversible inferior ischemia.  She underwent left heart cath on 05/17/14 which revealed:  1. Normal left main coronary artery. 2. Moderate disease in the mid left anterior descending artery which does not appear to be significant angiographically. The LIMA to LAD is atretic. There is also backfilling of the LAD from the SVG to diagonal. 3. Widely patent left circumflex artery and its branches. 4. Occluded mid right coronary artery.  Patent SVG to RCA. 5. Normal left ventricular systolic function.  LVEDP 17 mmHg.  Ejection fraction 65%. 6.  No abdominal aortic aneurysm. No renal artery stenosis  Her primary doctor increased her lipitor and has been treating her for LE cellulitis.  Her  mobility is limited due to her knee which she needs to have surgery on.  She denies CP and nausea, vomiting, fever, shortness of breath, orthopnea, dizziness, PND, cough, congestion, abdominal pain, hematochezia, melena.  She does eat a lot of sweets and states it is hard for her to avoid them.    Wt Readings from Last 3 Encounters:  06/14/14 193 lb 4.8 oz (87.68 kg)  05/17/14 186 lb (84.369 kg)  05/17/14 186 lb (84.369 kg)     Past Medical History  Diagnosis Date  . Hypertension   . Hyperlipemia   . Diabetes mellitus   . GERD (gastroesophageal reflux disease)   . CAD (coronary artery disease)     hx CABG 2007, LAST NUC EF 70%-no ischemia  . Aortic stenosis, mild 10/2012    mild AS by Echo , mild MR  . Neuropathy     Current Outpatient Prescriptions  Medication Sig Dispense Refill  . acetaminophen (TYLENOL) 650 MG CR tablet Take 1,300 mg by mouth daily as needed for pain.      Marland Kitchen ALPRAZolam (XANAX) 1 MG tablet Take 1 mg by mouth 2 (two) times daily as needed for anxiety.       Marland Kitchen aspirin EC 81 MG tablet Take 81 mg by mouth daily.      Marland Kitchen atorvastatin (LIPITOR) 20 MG tablet Take 40 mg by mouth daily.       . bisacodyl (DULCOLAX) 5 MG EC tablet  Take 5 mg by mouth daily as needed for moderate constipation.      Marland Kitchen diltiazem (CARDIZEM SR) 120 MG 12 hr capsule Take 1 capsule (120 mg total) by mouth 2 (two) times daily.  60 capsule  11  . diphenhydramine-acetaminophen (TYLENOL PM) 25-500 MG TABS Take 1 tablet by mouth at bedtime as needed (for sleep).      . EPINEPHrine (EPI-PEN) 0.3 mg/0.3 mL SOAJ injection Inject 0.3 mg into the muscle once as needed (for peanut allergy).       . furosemide (LASIX) 40 MG tablet Take 40 mg by mouth daily as needed for fluid.       Marland Kitchen gabapentin (NEURONTIN) 300 MG capsule Take 600 mg by mouth 3 (three) times daily.       . irbesartan (AVAPRO) 300 MG tablet Take 1 tablet (300 mg total) by mouth daily.  30 tablet  5  . Loratadine 10 MG CAPS Take 1 tablet by mouth  daily.      . meloxicam (MOBIC) 7.5 MG tablet Take 1 tablet by mouth daily.      . metFORMIN (GLUCOPHAGE-XR) 750 MG 24 hr tablet Take 1 tablet (750 mg total) by mouth daily.      . metoprolol (LOPRESSOR) 50 MG tablet Take 50 mg by mouth 3 (three) times daily.      . Multiple Vitamin (MULTIVITAMIN WITH MINERALS) TABS Take 1 tablet by mouth daily.      . nitroGLYCERIN (NITROSTAT) 0.4 MG SL tablet Place 0.4 mg under the tongue every 5 (five) minutes as needed for chest pain.      . Omega-3 Fatty Acids (FISH OIL PO) Take 1 capsule by mouth 3 (three) times daily.      . pantoprazole (PROTONIX) 40 MG tablet Take 1 tablet by mouth daily.      Marland Kitchen tiZANidine (ZANAFLEX) 4 MG tablet Take 2-4 mg by mouth daily as needed for muscle spasms.       . traMADol (ULTRAM) 50 MG tablet Take 150 mg by mouth at bedtime. Take as directed      . traZODone (DESYREL) 150 MG tablet Take 150 mg by mouth at bedtime as needed for sleep.       Marland Kitchen venlafaxine XR (EFFEXOR-XR) 75 MG 24 hr capsule Take 1 capsule by mouth daily.      Marland Kitchen CARTIA XT 120 MG 24 hr capsule        No current facility-administered medications for this visit.    Allergies:    Allergies  Allergen Reactions  . Other Anaphylaxis and Swelling    Nuts   . Penicillins     Social History:  The patient  reports that she has never smoked. She has never used smokeless tobacco. She reports that she does not drink alcohol or use illicit drugs.   Family history:   Family History  Problem Relation Age of Onset  . Heart disease Mother 4    heart attack  . Heart disease Brother 65    heart Attack  . Diabetes Mother   . Diabetes Brother   . Diabetes Father 46    heart attack    ROS:  Please see the history of present illness.  All other systems reviewed and negative.   PHYSICAL EXAM: VS:  BP 140/80  Pulse 61  Ht 5\' 3"  (1.6 m)  Wt 193 lb 4.8 oz (87.68 kg)  BMI 34.25 kg/m2 Obese, well developed, in no acute distress HEENT: Pupils are equal round  react to light accommodation extraocular movements are intact.  Neck: no JVDNo cervical lymphadenopathy. Cardiac: Regular rate and rhythm with 1/6 sys murmur.  No rubs or gallops. Lungs:  clear to auscultation bilaterally, no wheezing, rhonchi or rales Ext: 1+ lower extremity edema.  2+ radial and dorsalis pedis pulses. Skin: warm and dry.  Mild erythema in both lower extremities Neuro:  Grossly normal  EKG:  Normal sinus rhythm rate 61 beats per minute inferior T wave inversion  ASSESSMENT AND PLAN:  Problem List Items Addressed This Visit   Aortic valve stenosis, mild   Relevant Medications      CARTIA XT 120 MG 24 hr capsule   CAD (coronary artery disease) - Primary (Chronic)     See Cath note Above. No culprit lesion to explain stress test results. Moderate Mid lad lesion not thought to be significant.  Patient is not complaining of any angina.  I think she would be low risk for knee surgery. On aspirin, Lipitor, Lopressor.    Relevant Medications      CARTIA XT 120 MG 24 hr capsule   Other Relevant Orders      EKG 12-Lead (Completed)   Cellulitis of both lower extremities     She has mild erythema in both LE.  Her PCP has been treating this.    Obesity, Class I, BMI 30.0-34.9 (see actual BMI)     Discussed reduced carbohydrate intake. She admits that this is difficult for her. Also, limited mobility due to her knee is limiting exercise.    S/P CABG x 3     The LIMA to LAD is atretic. There is also backfilling of the LAD from the SVG to diagonal. Patent SVG to RCA.     Uncontrolled hypertension     Blood pressure is better today with increased irbesartan    Relevant Medications      CARTIA XT 120 MG 24 hr capsule

## 2014-06-14 NOTE — Patient Instructions (Addendum)
[place] for knee surgery.  2.  Follow up with Dr. Debara Pickett in 6 months.

## 2014-06-17 DIAGNOSIS — L03115 Cellulitis of right lower limb: Secondary | ICD-10-CM

## 2014-06-17 DIAGNOSIS — L03116 Cellulitis of left lower limb: Secondary | ICD-10-CM

## 2014-06-17 HISTORY — DX: Cellulitis of right lower limb: L03.115

## 2014-06-17 HISTORY — DX: Cellulitis of left lower limb: L03.116

## 2014-06-17 NOTE — Assessment & Plan Note (Signed)
She has mild erythema in both LE.  Her PCP has been treating this.

## 2014-07-02 ENCOUNTER — Telehealth: Payer: Self-pay | Admitting: Internal Medicine

## 2014-07-02 NOTE — Telephone Encounter (Signed)
Julie Leblanc called in stating that she received clearance from our office she just needed recent: EKG Cath results Offices notes from stress test and echo  Please call  Thanks

## 2014-08-29 ENCOUNTER — Encounter (HOSPITAL_COMMUNITY): Payer: Self-pay | Admitting: Interventional Cardiology

## 2014-10-15 ENCOUNTER — Telehealth: Payer: Self-pay | Admitting: Internal Medicine

## 2014-10-15 NOTE — Telephone Encounter (Signed)
Close encounter 

## 2014-10-18 ENCOUNTER — Ambulatory Visit: Payer: Commercial Managed Care - HMO | Admitting: Internal Medicine

## 2014-11-06 IMAGING — CR DG CHEST 2V
3 series · 3 of 3 positions shown · non-contrast
Comparison: Single view of the chest 03/27/2013. PA and lateral
chest 05/19/2009.

CLINICAL DATA: Preoperative films. Patient for cardiac
catheterization.

EXAM:
CHEST  2 VIEW

[w chest pa (1 of 2)]
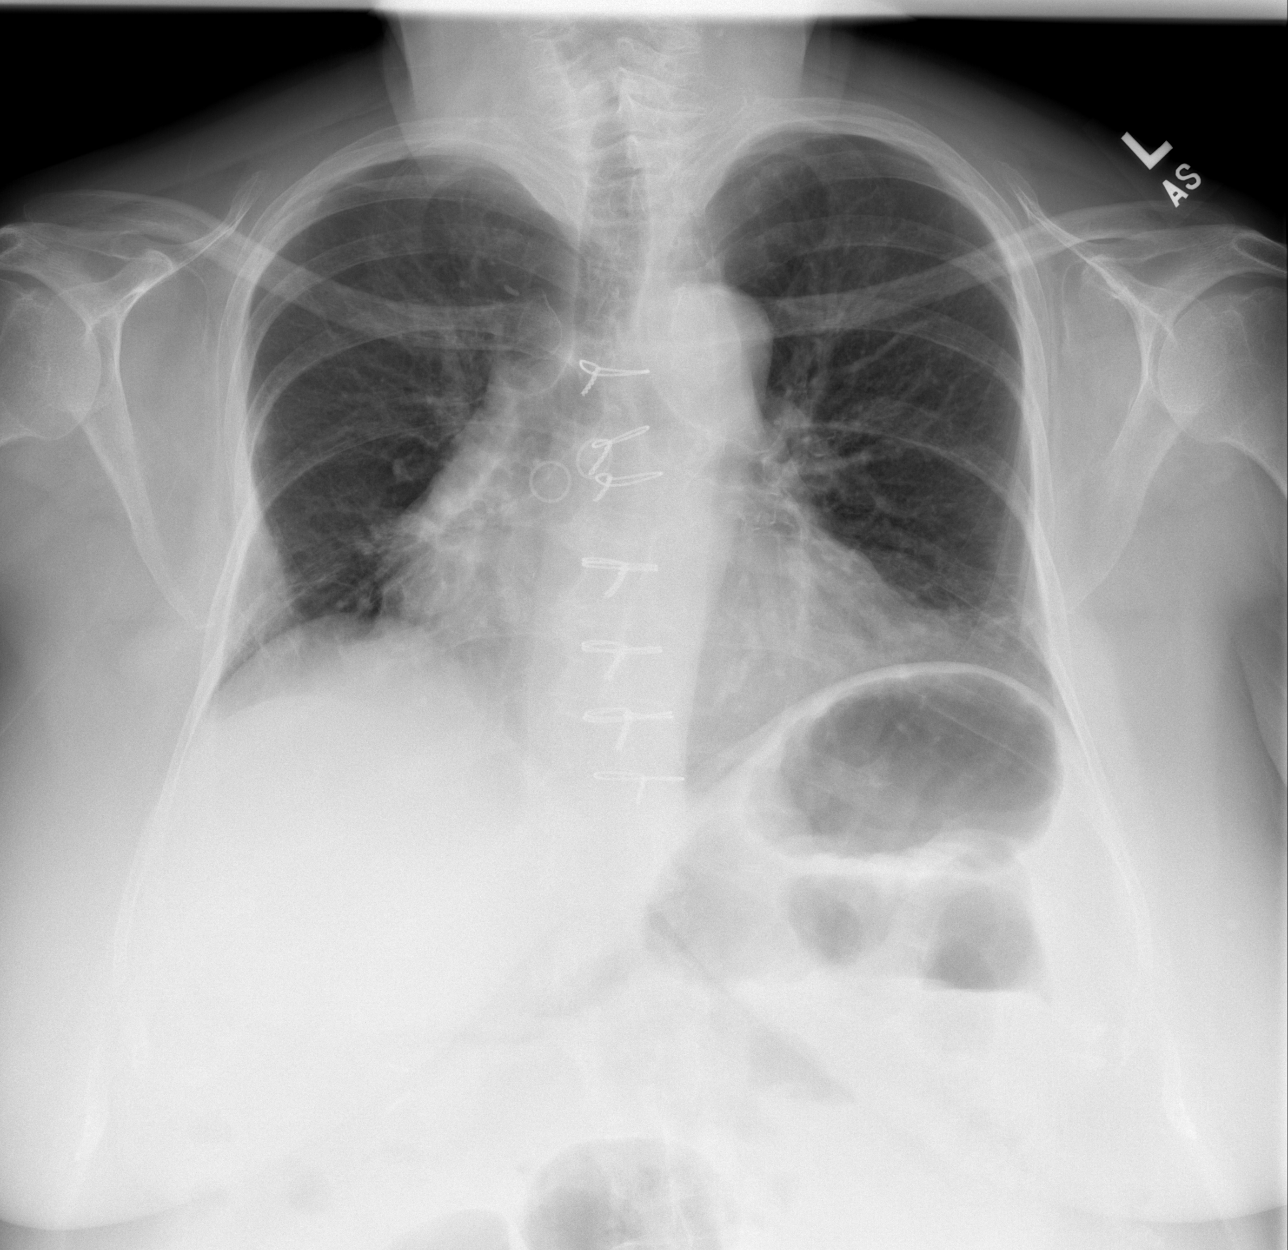

[w chest lat]
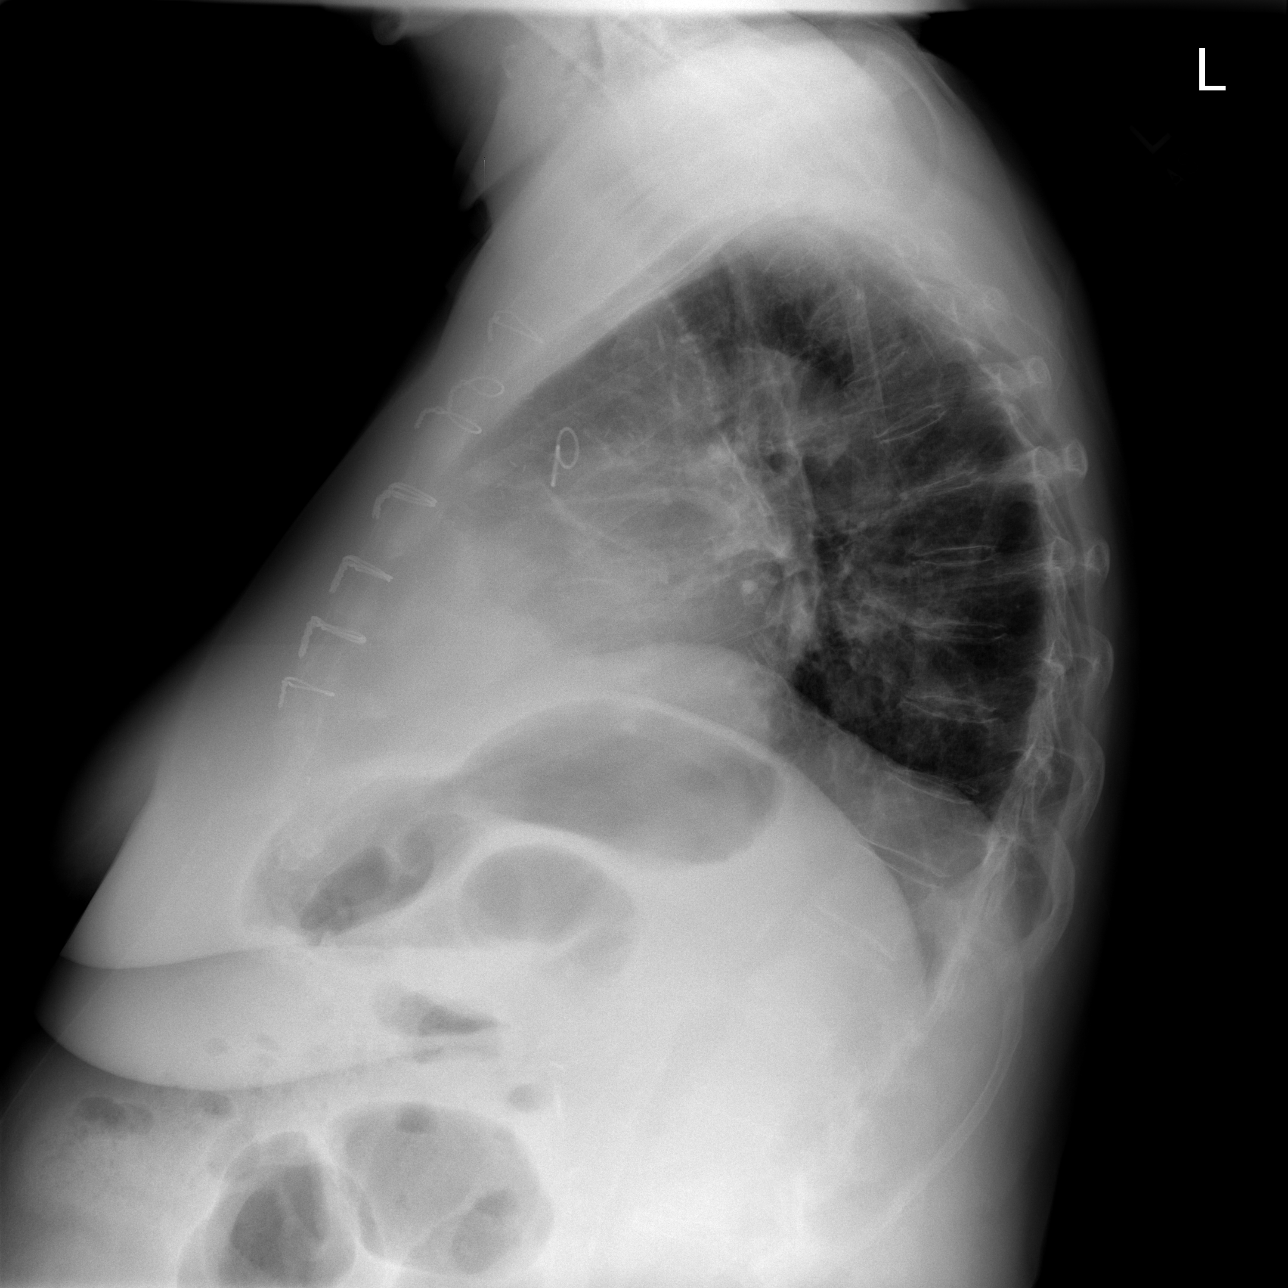

[w chest pa (2 of 2)]
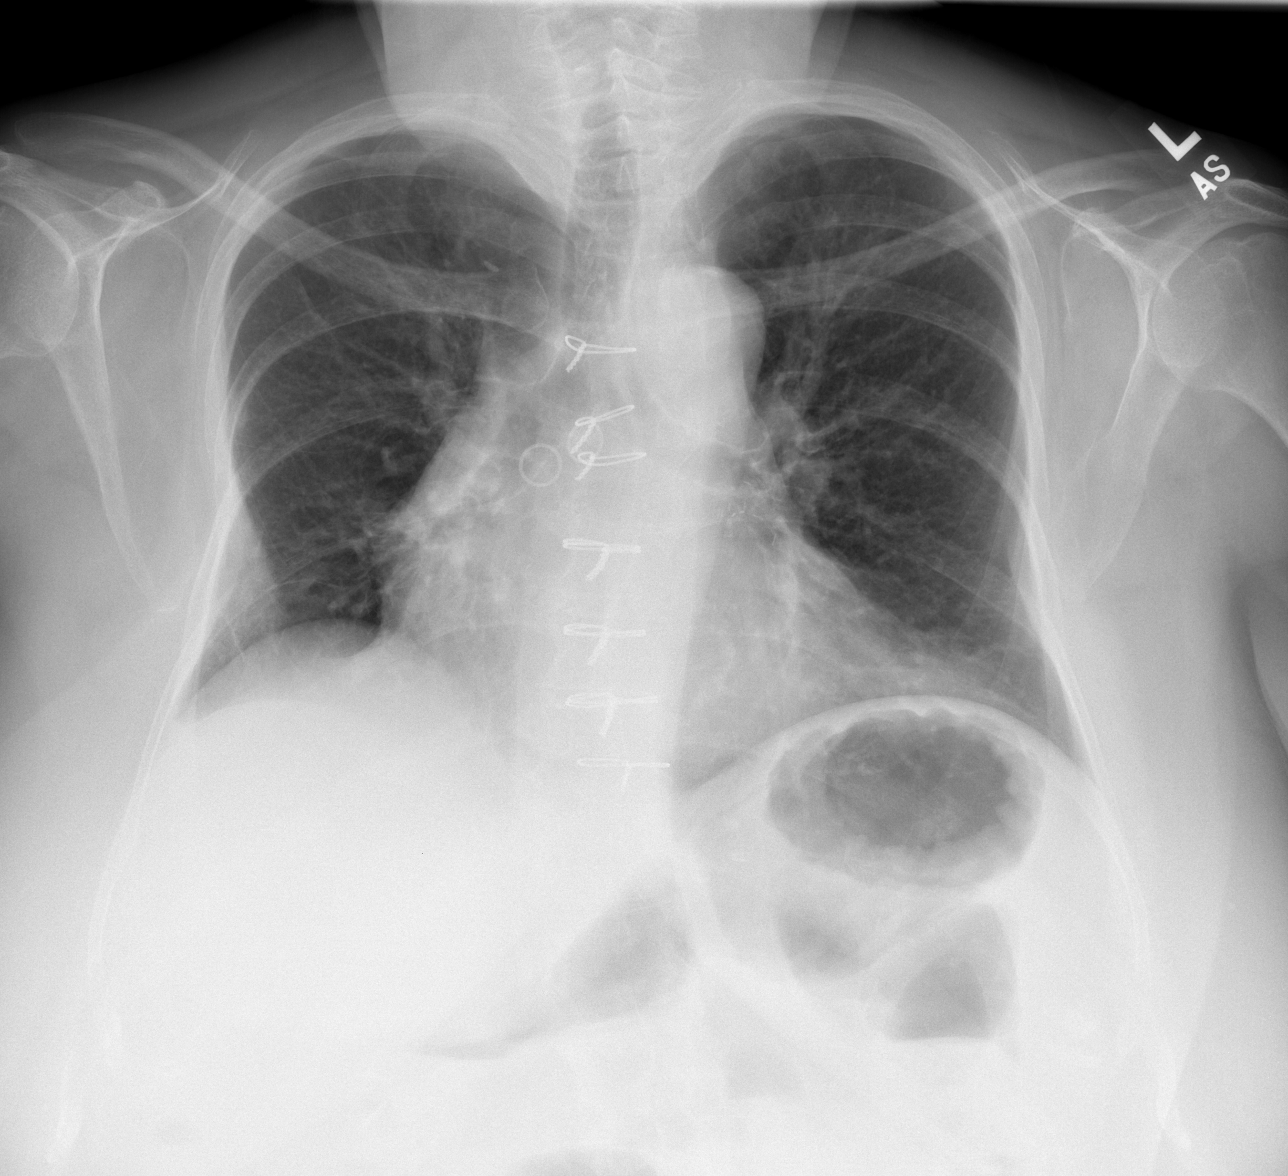

[3 of 3 positions shown; findings below may reference images not displayed]

FINDINGS: There is cardiomegaly without edema. The patient is status post
CABG. Lungs are clear. No pneumothorax or pleural effusion. No focal
bony abnormality.
IMPRESSION: Cardiomegaly without acute disease.

## 2014-11-22 ENCOUNTER — Encounter: Payer: Self-pay | Admitting: Internal Medicine

## 2014-11-22 ENCOUNTER — Ambulatory Visit (INDEPENDENT_AMBULATORY_CARE_PROVIDER_SITE_OTHER): Payer: Commercial Managed Care - HMO | Admitting: Internal Medicine

## 2014-11-22 VITALS — BP 150/92 | HR 67 | Ht 62.0 in | Wt 187.7 lb

## 2014-11-22 DIAGNOSIS — R931 Abnormal findings on diagnostic imaging of heart and coronary circulation: Secondary | ICD-10-CM

## 2014-11-22 DIAGNOSIS — I350 Nonrheumatic aortic (valve) stenosis: Secondary | ICD-10-CM | POA: Diagnosis not present

## 2014-11-22 DIAGNOSIS — I2510 Atherosclerotic heart disease of native coronary artery without angina pectoris: Secondary | ICD-10-CM | POA: Diagnosis not present

## 2014-11-22 DIAGNOSIS — E119 Type 2 diabetes mellitus without complications: Secondary | ICD-10-CM

## 2014-11-22 DIAGNOSIS — I2583 Coronary atherosclerosis due to lipid rich plaque: Principal | ICD-10-CM

## 2014-11-22 DIAGNOSIS — Z951 Presence of aortocoronary bypass graft: Secondary | ICD-10-CM | POA: Diagnosis not present

## 2014-11-22 DIAGNOSIS — R9439 Abnormal result of other cardiovascular function study: Secondary | ICD-10-CM

## 2014-11-22 NOTE — Progress Notes (Signed)
OFFICE NOTE  Chief Complaint:  Leg swelling  Primary Care Physician: Mateo Flow, MD  HPI:  Julie Leblanc is a 69 year old female previously followed by Dr. Rex Kras with a history of CABG in 2007 by Dr. Prescott Gum. This included a LIMA to the LAD, a vein graft to the diagonal and vein graft to the distal RCA. In 2010 she had a nuclear stress test that showed an EF of 70%. No ischemia. She has diabetes and dyslipidemia but good control of her diabetes on oral medications. Recently she had a knee replacement, and she is contemplating a future knee replacement as well. She is totally asymptomatic from a cardiac standpoint, denies any chest pain, worsening shortness of breath, palpitations, presyncope or syncopal symptoms.  This past summer she saw Cecilie Kicks, FNP, for dizziness and was noted to be having dark stools. Subsequently she was found to have a low hemoglobin and was referred to [facility]. She underwent EGD and colonoscopy and was found to have multiple gastric ulcers which have started to heal based on her report of a recent EGD one month ago. In addition she had a recent fall and landed on her left knee and developed a hematoma. Just has a resolving hematoma on her left cheek. She is still undergoing packing and had incision and drainage of the left knee and is bothered by warmth and swelling of the left lower extremity which was possibly concerning for cellulitis. Other changes recently included switching her diltiazem to the 120 mg SR twice daily, due to insurance changes  Julie Leblanc returns today for followup. She has responded having problems with uncontrolled hypertension as well as symptoms of stress and anxiety. In addition she's had some chest discomfort which is improved with aspirin. She's been having problems with her left knee as outlined above and then recently was told she needs left knee replacement. The surgery is coming up. I'm concerned about the fact that she  is having some discomfort in her chest and now it appears that she is having. Poorly controlled hypertension. Blood pressures are in the 371G to 626 systolic. Recent lab work demonstrated normal renal function.  At her last visit we recommended a stress test preoperatively as she was having some chest discomfort as well. She's also had recently voided controlled hypertension. I started her on irbesartan 150 mg and and followup with our hypertension pharmacist her dose was increased to 300 mg daily. Blood pressure still remained in the 170-180 range. She is complaining of significant left knee pain. She continues to have some mild chest discomfort. Her stress test indicated reversible inferior ischemia, albeit a small area, however this could be implicated in her chest pain symptoms and recent difficult to control hypertension.  I saw Julie Leblanc back today. She seems to be doing fairly well other than complaining of ankle swelling. This is due to a combination of significant neuropathy as well as the fact that she's had veins removed from her legs for bypass. She denies any chest pain or worsening shortness of breath. Cholesterol is being followed by her primary care provider.  PMHx:  Past Medical History  Diagnosis Date  . Hypertension   . Hyperlipemia   . Diabetes mellitus   . GERD (gastroesophageal reflux disease)   . CAD (coronary artery disease)     hx CABG 2007, LAST NUC EF 70%-no ischemia  . Aortic stenosis, mild 10/2012    mild AS by Echo , mild MR  .  Neuropathy     Past Surgical History  Procedure Laterality Date  . Coronary artery bypass graft  01/24/2006    x 3,LIMA-LAD, VG-diag, VG-distal RCA  . Abdominal hysterectomy  1990  . Gallbladder surgery  1990's  . Knee replacment  1990's  . Lung repair      s/p lap chole  . Left heart catheterization with coronary/graft angiogram N/A 05/17/2014    Procedure: LEFT HEART CATHETERIZATION WITH Beatrix Fetters;  Surgeon: Jettie Booze, MD;  Location: [facility] [place] CATH LAB;  Service: Cardiovascular;  Laterality: N/A;    FAMHx:  Family History  Problem Relation Age of Onset  . Heart disease Mother 87    heart attack  . Heart disease Brother 54    heart Attack  . Diabetes Mother   . Diabetes Brother   . Diabetes Father 38    heart attack    SOCHx:   reports that she has never smoked. She has never used smokeless tobacco. She reports that she does not drink alcohol or use illicit drugs.  ALLERGIES:  Allergies  Allergen Reactions  . Other Anaphylaxis and Swelling    Nuts   . Penicillins     ROS: A comprehensive review of systems was negative except for: Cardiovascular: positive for lower extremity edema  HOME MEDS: Current Outpatient Prescriptions  Medication Sig Dispense Refill  . acetaminophen (TYLENOL) 650 MG CR tablet Take 1,300 mg by mouth daily as needed for pain.    Marland Kitchen ALPRAZolam (XANAX) 1 MG tablet Take 1 mg by mouth 2 (two) times daily as needed for anxiety.     Marland Kitchen aspirin EC 81 MG tablet Take 81 mg by mouth daily.    Marland Kitchen atorvastatin (LIPITOR) 40 MG tablet Take 1 tablet by mouth daily.    . bisacodyl (DULCOLAX) 5 MG EC tablet Take 5 mg by mouth daily as needed for moderate constipation.    Marland Kitchen diltiazem (CARDIZEM SR) 120 MG 12 hr capsule Take 1 capsule (120 mg total) by mouth 2 (two) times daily. 60 capsule 11  . diphenhydramine-acetaminophen (TYLENOL PM) 25-500 MG TABS Take 1 tablet by mouth at bedtime as needed (for sleep).    . EPINEPHrine (EPI-PEN) 0.3 mg/0.3 mL SOAJ injection Inject 0.3 mg into the muscle once as needed (for peanut allergy).     . furosemide (LASIX) 40 MG tablet Take 40 mg by mouth daily as needed for fluid.     Marland Kitchen gabapentin (NEURONTIN) 300 MG capsule Take 600 mg by mouth 3 (three) times daily.     . irbesartan (AVAPRO) 300 MG tablet Take 1 tablet (300 mg total) by mouth daily. 30 tablet 5  . Loratadine 10 MG CAPS Take 1 tablet by mouth daily.    . meloxicam (MOBIC) 7.5 MG tablet  Take 1 tablet by mouth daily.    . metFORMIN (GLUCOPHAGE-XR) 750 MG 24 hr tablet Take 1 tablet (750 mg total) by mouth daily.    . metoprolol (LOPRESSOR) 50 MG tablet Take 50 mg by mouth 3 (three) times daily.    . Multiple Vitamin (MULTIVITAMIN WITH MINERALS) TABS Take 1 tablet by mouth daily.    . nitroGLYCERIN (NITROSTAT) 0.4 MG SL tablet Place 0.4 mg under the tongue every 5 (five) minutes as needed for chest pain.    . Omega-3 Fatty Acids (FISH OIL PO) Take 1 capsule by mouth 3 (three) times daily.    . pantoprazole (PROTONIX) 40 MG tablet Take 1 tablet by mouth daily.    Marland Kitchen  tiZANidine (ZANAFLEX) 4 MG tablet Take 2-4 mg by mouth daily as needed for muscle spasms.     . traMADol (ULTRAM) 50 MG tablet Take 150 mg by mouth at bedtime. Take as directed    . traZODone (DESYREL) 100 MG tablet Take 100 mg by mouth at bedtime as needed.    . venlafaxine XR (EFFEXOR-XR) 75 MG 24 hr capsule Take 1 capsule by mouth daily.     No current facility-administered medications for this visit.    LABS/IMAGING: No results found for this or any previous visit (from the past 48 hour(s)). No results found.  VITALS: BP 150/92 mmHg  Pulse 67  Ht 5\' 2"  (1.575 m)  Wt 187 lb 11.2 oz (85.14 kg)  BMI 34.32 kg/m2  EXAM: General appearance: alert and no distress Neck: no carotid bruit and no JVD Lungs: clear to auscultation bilaterally Heart: regular rate and rhythm, S1, S2 normal and systolic murmur: early systolic 2/6, crescendo at 2nd right intercostal space Abdomen: soft, non-tender; bowel sounds normal; no masses,  no organomegaly Extremities: extremities normal, atraumatic, no cyanosis or edema Pulses: 2+ and symmetric Skin: Skin color, texture, turgor normal. No rashes or lesions Neurologic: Grossly normal   EKG: Normal sinus rhythm at 67  ASSESSMENT: 1. Coronary artery disease status post three-vessel CABG in 2007  2. Diabetes type 2 3. Dyslipidemia 4. Mild aortic stenosis 5. HTN -  uncontrolled 6. Chest pain- abnormal nuclear stress test  PLAN: 1.   Julie Leblanc is doing well. She denies any chest pain or worsening shortness of breath. Blood pressure is well controlled. Cholesterol is followed by primary care provider. Diabetes could be better controlled. She does have lower STEMI swelling which is due to neuropathy and venous insufficiency. I've again encouraged her to wear compression stockings in both legs when she is up on her feet or seated during the day. Plan to see her back in 6 months per request.  Pixie Casino, MD, [facility] Attending Cardiologist [facility]  Javari Bufkin C 11/22/2014, 5:11 PM

## 2014-11-22 NOTE — Patient Instructions (Signed)
Your physician wants you to follow-up in: 6 months with Dr. Hilty. You will receive a reminder letter in the mail two months in advance. If you don't receive a letter, please call our office to schedule the follow-up appointment.    

## 2014-11-29 DIAGNOSIS — M25531 Pain in right wrist: Secondary | ICD-10-CM | POA: Diagnosis not present

## 2014-11-29 DIAGNOSIS — M79641 Pain in right hand: Secondary | ICD-10-CM | POA: Diagnosis not present

## 2014-11-29 DIAGNOSIS — S59911A Unspecified injury of right forearm, initial encounter: Secondary | ICD-10-CM | POA: Diagnosis not present

## 2014-11-29 DIAGNOSIS — S6991XA Unspecified injury of right wrist, hand and finger(s), initial encounter: Secondary | ICD-10-CM | POA: Diagnosis not present

## 2014-11-29 DIAGNOSIS — S63501A Unspecified sprain of right wrist, initial encounter: Secondary | ICD-10-CM | POA: Diagnosis not present

## 2015-01-30 DIAGNOSIS — M19042 Primary osteoarthritis, left hand: Secondary | ICD-10-CM | POA: Diagnosis not present

## 2015-01-30 DIAGNOSIS — E119 Type 2 diabetes mellitus without complications: Secondary | ICD-10-CM | POA: Diagnosis not present

## 2015-01-30 DIAGNOSIS — E782 Mixed hyperlipidemia: Secondary | ICD-10-CM | POA: Diagnosis not present

## 2015-01-30 DIAGNOSIS — Z79899 Other long term (current) drug therapy: Secondary | ICD-10-CM | POA: Diagnosis not present

## 2015-01-30 DIAGNOSIS — M19041 Primary osteoarthritis, right hand: Secondary | ICD-10-CM | POA: Diagnosis not present

## 2015-01-30 DIAGNOSIS — Z23 Encounter for immunization: Secondary | ICD-10-CM | POA: Diagnosis not present

## 2015-01-31 DIAGNOSIS — J209 Acute bronchitis, unspecified: Secondary | ICD-10-CM | POA: Diagnosis not present

## 2015-01-31 DIAGNOSIS — K219 Gastro-esophageal reflux disease without esophagitis: Secondary | ICD-10-CM | POA: Diagnosis not present

## 2015-01-31 DIAGNOSIS — M2392 Unspecified internal derangement of left knee: Secondary | ICD-10-CM | POA: Diagnosis not present

## 2015-01-31 DIAGNOSIS — F419 Anxiety disorder, unspecified: Secondary | ICD-10-CM | POA: Diagnosis not present

## 2015-02-20 DIAGNOSIS — H52223 Regular astigmatism, bilateral: Secondary | ICD-10-CM | POA: Diagnosis not present

## 2015-02-20 DIAGNOSIS — I10 Essential (primary) hypertension: Secondary | ICD-10-CM | POA: Diagnosis not present

## 2015-02-20 DIAGNOSIS — H524 Presbyopia: Secondary | ICD-10-CM | POA: Diagnosis not present

## 2015-02-20 DIAGNOSIS — E119 Type 2 diabetes mellitus without complications: Secondary | ICD-10-CM | POA: Diagnosis not present

## 2015-02-20 DIAGNOSIS — H25813 Combined forms of age-related cataract, bilateral: Secondary | ICD-10-CM | POA: Diagnosis not present

## 2015-02-20 DIAGNOSIS — H5203 Hypermetropia, bilateral: Secondary | ICD-10-CM | POA: Diagnosis not present

## 2015-03-03 DIAGNOSIS — M1990 Unspecified osteoarthritis, unspecified site: Secondary | ICD-10-CM | POA: Diagnosis not present

## 2015-03-03 DIAGNOSIS — D649 Anemia, unspecified: Secondary | ICD-10-CM | POA: Diagnosis not present

## 2015-03-03 DIAGNOSIS — K219 Gastro-esophageal reflux disease without esophagitis: Secondary | ICD-10-CM | POA: Diagnosis not present

## 2015-03-03 DIAGNOSIS — R609 Edema, unspecified: Secondary | ICD-10-CM | POA: Diagnosis not present

## 2015-03-11 DIAGNOSIS — N3001 Acute cystitis with hematuria: Secondary | ICD-10-CM | POA: Diagnosis not present

## 2015-03-11 DIAGNOSIS — N3091 Cystitis, unspecified with hematuria: Secondary | ICD-10-CM | POA: Diagnosis not present

## 2015-04-02 DIAGNOSIS — R609 Edema, unspecified: Secondary | ICD-10-CM | POA: Diagnosis not present

## 2015-04-02 DIAGNOSIS — K219 Gastro-esophageal reflux disease without esophagitis: Secondary | ICD-10-CM | POA: Diagnosis not present

## 2015-04-02 DIAGNOSIS — D649 Anemia, unspecified: Secondary | ICD-10-CM | POA: Diagnosis not present

## 2015-04-02 DIAGNOSIS — J309 Allergic rhinitis, unspecified: Secondary | ICD-10-CM | POA: Diagnosis not present

## 2015-04-17 DIAGNOSIS — H269 Unspecified cataract: Secondary | ICD-10-CM | POA: Diagnosis not present

## 2015-04-17 DIAGNOSIS — M7989 Other specified soft tissue disorders: Secondary | ICD-10-CM | POA: Diagnosis not present

## 2015-04-17 DIAGNOSIS — Z9181 History of falling: Secondary | ICD-10-CM | POA: Diagnosis not present

## 2015-05-02 DIAGNOSIS — H2511 Age-related nuclear cataract, right eye: Secondary | ICD-10-CM | POA: Diagnosis not present

## 2015-05-02 DIAGNOSIS — H1859 Other hereditary corneal dystrophies: Secondary | ICD-10-CM | POA: Diagnosis not present

## 2015-05-20 DIAGNOSIS — Z7982 Long term (current) use of aspirin: Secondary | ICD-10-CM | POA: Diagnosis not present

## 2015-05-20 DIAGNOSIS — H2511 Age-related nuclear cataract, right eye: Secondary | ICD-10-CM | POA: Diagnosis not present

## 2015-05-20 DIAGNOSIS — I10 Essential (primary) hypertension: Secondary | ICD-10-CM | POA: Diagnosis not present

## 2015-05-20 DIAGNOSIS — G629 Polyneuropathy, unspecified: Secondary | ICD-10-CM | POA: Diagnosis not present

## 2015-05-20 DIAGNOSIS — K219 Gastro-esophageal reflux disease without esophagitis: Secondary | ICD-10-CM | POA: Diagnosis not present

## 2015-05-20 DIAGNOSIS — H269 Unspecified cataract: Secondary | ICD-10-CM | POA: Diagnosis not present

## 2015-05-20 DIAGNOSIS — Z79899 Other long term (current) drug therapy: Secondary | ICD-10-CM | POA: Diagnosis not present

## 2015-05-20 DIAGNOSIS — E119 Type 2 diabetes mellitus without complications: Secondary | ICD-10-CM | POA: Diagnosis not present

## 2015-05-27 DIAGNOSIS — H04123 Dry eye syndrome of bilateral lacrimal glands: Secondary | ICD-10-CM | POA: Diagnosis not present

## 2015-06-01 DIAGNOSIS — J019 Acute sinusitis, unspecified: Secondary | ICD-10-CM | POA: Diagnosis not present

## 2015-06-01 DIAGNOSIS — J209 Acute bronchitis, unspecified: Secondary | ICD-10-CM | POA: Diagnosis not present

## 2015-06-12 ENCOUNTER — Encounter: Payer: Self-pay | Admitting: Internal Medicine

## 2015-06-12 ENCOUNTER — Ambulatory Visit (INDEPENDENT_AMBULATORY_CARE_PROVIDER_SITE_OTHER): Payer: Commercial Managed Care - HMO | Admitting: Internal Medicine

## 2015-06-12 VITALS — BP 190/90 | HR 60 | Ht 63.0 in | Wt 191.0 lb

## 2015-06-12 DIAGNOSIS — Z951 Presence of aortocoronary bypass graft: Secondary | ICD-10-CM

## 2015-06-12 DIAGNOSIS — E785 Hyperlipidemia, unspecified: Secondary | ICD-10-CM

## 2015-06-12 DIAGNOSIS — I350 Nonrheumatic aortic (valve) stenosis: Secondary | ICD-10-CM

## 2015-06-12 DIAGNOSIS — I2583 Coronary atherosclerosis due to lipid rich plaque: Principal | ICD-10-CM

## 2015-06-12 DIAGNOSIS — I10 Essential (primary) hypertension: Secondary | ICD-10-CM | POA: Diagnosis not present

## 2015-06-12 DIAGNOSIS — I2510 Atherosclerotic heart disease of native coronary artery without angina pectoris: Secondary | ICD-10-CM

## 2015-06-12 NOTE — Patient Instructions (Signed)
Your physician recommends that you schedule a follow-up appointment in: 6 MONTHS WITH DR. HILTY.  

## 2015-06-12 NOTE — Progress Notes (Signed)
OFFICE NOTE  Chief Complaint:  Leg swelling  Primary Care Physician: Mateo Flow, MD  HPI:  Julie Leblanc is a 69 year old female previously followed by Dr. Rex Kras with a history of CABG in 2007 by Dr. Prescott Gum. This included a LIMA to the LAD, a vein graft to the diagonal and vein graft to the distal RCA. In 2010 she had a nuclear stress test that showed an EF of 70%. No ischemia. She has diabetes and dyslipidemia but good control of her diabetes on oral medications. Recently she had a knee replacement, and she is contemplating a future knee replacement as well. She is totally asymptomatic from a cardiac standpoint, denies any chest pain, worsening shortness of breath, palpitations, presyncope or syncopal symptoms.  This past summer she saw Cecilie Kicks, FNP, for dizziness and was noted to be having dark stools. Subsequently she was found to have a low hemoglobin and was referred to [facility]. She underwent EGD and colonoscopy and was found to have multiple gastric ulcers which have started to heal based on her report of a recent EGD one month ago. In addition she had a recent fall and landed on her left knee and developed a hematoma. Just has a resolving hematoma on her left cheek. She is still undergoing packing and had incision and drainage of the left knee and is bothered by warmth and swelling of the left lower extremity which was possibly concerning for cellulitis. Other changes recently included switching her diltiazem to the 120 mg SR twice daily, due to insurance changes  Mrs. Mcgaha returns today for followup. She has responded having problems with uncontrolled hypertension as well as symptoms of stress and anxiety. In addition she's had some chest discomfort which is improved with aspirin. She's been having problems with her left knee as outlined above and then recently was told she needs left knee replacement. The surgery is coming up. I'm concerned about the fact that she  is having some discomfort in her chest and now it appears that she is having. Poorly controlled hypertension. Blood pressures are in the 269S to 854 systolic. Recent lab work demonstrated normal renal function.  At her last visit we recommended a stress test preoperatively as she was having some chest discomfort as well. She's also had recently voided controlled hypertension. I started her on irbesartan 150 mg and and followup with our hypertension pharmacist her dose was increased to 300 mg daily. Blood pressure still remained in the 170-180 range. She is complaining of significant left knee pain. She continues to have some mild chest discomfort. Her stress test indicated reversible inferior ischemia, albeit a small area, however this could be implicated in her chest pain symptoms and recent difficult to control hypertension.  I saw [name] back today. She seems to be doing fairly well other than complaining of ankle swelling. This is due to a combination of significant neuropathy as well as the fact that she's had veins removed from her legs for bypass. She denies any chest pain or worsening shortness of breath. Cholesterol is being followed by her primary care provider.  She has not required any additional short acting nitroglycerin.  PMHx:  Past Medical History  Diagnosis Date  . Hypertension   . Hyperlipemia   . Diabetes mellitus   . GERD (gastroesophageal reflux disease)   . CAD (coronary artery disease)     hx CABG 2007, LAST NUC EF 70%-no ischemia  . Aortic stenosis, mild 10/2012  mild AS by Echo , mild MR  . Neuropathy     Past Surgical History  Procedure Laterality Date  . Coronary artery bypass graft  01/24/2006    x 3,LIMA-LAD, VG-diag, VG-distal RCA  . Abdominal hysterectomy  1990  . Gallbladder surgery  1990's  . Knee replacment  1990's  . Lung repair      s/p lap chole  . Left heart catheterization with coronary/graft angiogram N/A 05/17/2014    Procedure: LEFT HEART  CATHETERIZATION WITH Beatrix Fetters;  Surgeon: Jettie Booze, MD;  Location: [facility] CATH LAB;  Service: Cardiovascular;  Laterality: N/A;    FAMHx:  Family History  Problem Relation Age of Onset  . Heart disease Mother 58    heart attack  . Heart disease Brother 1    heart Attack  . Diabetes Mother   . Diabetes Brother   . Diabetes Father 43    heart attack    SOCHx:   reports that she has never smoked. She has never used smokeless tobacco. She reports that she does not drink alcohol or use illicit drugs.  ALLERGIES:  Allergies  Allergen Reactions  . Other Anaphylaxis and Swelling    Nuts   . Penicillins     ROS: A comprehensive review of systems was negative except for: Cardiovascular: positive for lower extremity edema  HOME MEDS: Current Outpatient Prescriptions  Medication Sig Dispense Refill  . acetaminophen (TYLENOL) 650 MG CR tablet Take 1,300 mg by mouth daily as needed for pain.    Marland Kitchen ALPRAZolam (XANAX) 1 MG tablet Take 1 mg by mouth 2 (two) times daily as needed for anxiety.     Marland Kitchen aspirin EC 81 MG tablet Take 81 mg by mouth daily.    Marland Kitchen atorvastatin (LIPITOR) 40 MG tablet Take 1 tablet by mouth daily.    . bisacodyl (DULCOLAX) 5 MG EC tablet Take 5 mg by mouth daily as needed for moderate constipation.    Marland Kitchen diltiazem (CARDIZEM SR) 120 MG 12 hr capsule Take 1 capsule (120 mg total) by mouth 2 (two) times daily. 60 capsule 11  . diphenhydramine-acetaminophen (TYLENOL PM) 25-500 MG TABS Take 1 tablet by mouth at bedtime as needed (for sleep).    . EPINEPHrine (EPI-PEN) 0.3 mg/0.3 mL SOAJ injection Inject 0.3 mg into the muscle once as needed (for peanut allergy).     . furosemide (LASIX) 40 MG tablet Take 40 mg by mouth daily as needed for fluid.     Marland Kitchen gabapentin (NEURONTIN) 300 MG capsule Take 600 mg by mouth 3 (three) times daily.     . irbesartan (AVAPRO) 300 MG tablet Take 1 tablet (300 mg total) by mouth daily. 30 tablet 5  . Loratadine 10 MG CAPS  Take 1 tablet by mouth daily.    . metFORMIN (GLUCOPHAGE-XR) 750 MG 24 hr tablet Take 1 tablet (750 mg total) by mouth daily.    . metoprolol (LOPRESSOR) 50 MG tablet Take 50 mg by mouth 3 (three) times daily.    . Multiple Vitamin (MULTIVITAMIN WITH MINERALS) TABS Take 1 tablet by mouth daily.    . nitroGLYCERIN (NITROSTAT) 0.4 MG SL tablet Place 0.4 mg under the tongue every 5 (five) minutes as needed for chest pain.    . Omega-3 Fatty Acids (FISH OIL PO) Take 1 capsule by mouth 3 (three) times daily.    . pantoprazole (PROTONIX) 40 MG tablet Take 1 tablet by mouth daily.    Marland Kitchen tiZANidine (ZANAFLEX) 4 MG tablet Take  2-4 mg by mouth daily as needed for muscle spasms.     . traMADol (ULTRAM) 50 MG tablet Take 150 mg by mouth at bedtime. Take as directed    . traZODone (DESYREL) 100 MG tablet Take 100 mg by mouth at bedtime as needed.    . venlafaxine XR (EFFEXOR-XR) 75 MG 24 hr capsule Take 1 capsule by mouth daily.     No current facility-administered medications for this visit.    LABS/IMAGING: No results found for this or any previous visit (from the past 48 hour(s)). No results found.  VITALS: BP 190/90 mmHg  Pulse 60  Ht 5\' 3"  (1.6 m)  Wt 191 lb (86.637 kg)  BMI 33.84 kg/m2  EXAM: General appearance: alert and no distress Neck: no carotid bruit and no JVD Lungs: clear to auscultation bilaterally Heart: regular rate and rhythm, S1, S2 normal and systolic murmur: early systolic 2/6, crescendo at 2nd right intercostal space Abdomen: soft, non-tender; bowel sounds normal; no masses,  no organomegaly Extremities: extremities normal, atraumatic, no cyanosis or edema Pulses: 2+ and symmetric Skin: Skin color, texture, turgor normal. No rashes or lesions Neurologic: Grossly normal   EKG: Normal sinus rhythm at 60  ASSESSMENT: 1. Coronary artery disease status post three-vessel CABG in 2007  2. Diabetes type 2 3. Dyslipidemia 4. Mild aortic stenosis 5. HTN -  uncontrolled  PLAN: 1.   Mrs. Kallstrom is doing well. She denies any chest pain or worsening shortness of breath.  Although her stress test was abnormal, she seems to be doing well with medical therapy. Blood pressure is well controlled. Cholesterol is followed by primary care provider. Diabetes could be better controlled. She does have lower extremity swelling which is due to neuropathy and venous insufficiency. I've again encouraged her to wear compression stockings in both legs when she is up on her feet or seated during the day. Plan to see her back in 6 months.  Pixie Casino, MD, [facility] Attending Cardiologist [place] C Hilty 06/12/2015, 5:26 PM

## 2015-06-17 DIAGNOSIS — H2512 Age-related nuclear cataract, left eye: Secondary | ICD-10-CM | POA: Diagnosis not present

## 2015-06-17 DIAGNOSIS — Z951 Presence of aortocoronary bypass graft: Secondary | ICD-10-CM | POA: Diagnosis not present

## 2015-06-17 DIAGNOSIS — Z7982 Long term (current) use of aspirin: Secondary | ICD-10-CM | POA: Diagnosis not present

## 2015-06-17 DIAGNOSIS — H25812 Combined forms of age-related cataract, left eye: Secondary | ICD-10-CM | POA: Diagnosis not present

## 2015-06-17 DIAGNOSIS — Z9841 Cataract extraction status, right eye: Secondary | ICD-10-CM | POA: Diagnosis not present

## 2015-06-17 DIAGNOSIS — I252 Old myocardial infarction: Secondary | ICD-10-CM | POA: Diagnosis not present

## 2015-06-17 DIAGNOSIS — H52223 Regular astigmatism, bilateral: Secondary | ICD-10-CM | POA: Diagnosis not present

## 2015-06-17 DIAGNOSIS — Z9842 Cataract extraction status, left eye: Secondary | ICD-10-CM | POA: Diagnosis not present

## 2015-06-17 DIAGNOSIS — I10 Essential (primary) hypertension: Secondary | ICD-10-CM | POA: Diagnosis not present

## 2015-06-17 DIAGNOSIS — E785 Hyperlipidemia, unspecified: Secondary | ICD-10-CM | POA: Diagnosis not present

## 2015-06-17 DIAGNOSIS — G629 Polyneuropathy, unspecified: Secondary | ICD-10-CM | POA: Diagnosis not present

## 2015-06-17 DIAGNOSIS — Z961 Presence of intraocular lens: Secondary | ICD-10-CM | POA: Diagnosis not present

## 2015-06-17 DIAGNOSIS — H524 Presbyopia: Secondary | ICD-10-CM | POA: Diagnosis not present

## 2015-06-17 DIAGNOSIS — E119 Type 2 diabetes mellitus without complications: Secondary | ICD-10-CM | POA: Diagnosis not present

## 2015-06-17 DIAGNOSIS — Z79899 Other long term (current) drug therapy: Secondary | ICD-10-CM | POA: Diagnosis not present

## 2015-06-26 DIAGNOSIS — Z0100 Encounter for examination of eyes and vision without abnormal findings: Secondary | ICD-10-CM | POA: Diagnosis not present

## 2015-07-09 ENCOUNTER — Telehealth: Payer: Self-pay | Admitting: Internal Medicine

## 2015-07-09 NOTE — Telephone Encounter (Signed)
Those are elevated - maybe offer her an appointment to see [name] for bp management next week if possible.  Thanks.  Dr.H

## 2015-07-09 NOTE — Telephone Encounter (Signed)
Pt wants to give you her blood pressure readings for a week.

## 2015-07-09 NOTE — Telephone Encounter (Signed)
Returned call to patient.Dr.Hilty advised B/P elevated.He advised to see our pharmacist [name] next week for B/P management.Schedulers will call back tomorrow to schedule appointment with [name].

## 2015-07-09 NOTE — Telephone Encounter (Signed)
Returned call to patient calling to report B/P readings.161/76 pulse 52,184/79 p 56,160/76 p 68,168/79 p 55,160/76 p 79,156/75 p 64,197/78 p 51,184/79 p 51,185/85 p 54.Message sent to Dr.Hilty.

## 2015-07-17 ENCOUNTER — Ambulatory Visit (INDEPENDENT_AMBULATORY_CARE_PROVIDER_SITE_OTHER): Payer: Commercial Managed Care - HMO | Admitting: Pharmacist Clinician (PhC)/ Clinical Pharmacy Specialist

## 2015-07-17 ENCOUNTER — Encounter: Payer: Self-pay | Admitting: Pharmacist Clinician (PhC)/ Clinical Pharmacy Specialist

## 2015-07-17 ENCOUNTER — Ambulatory Visit: Payer: Self-pay | Admitting: Pharmacist Clinician (PhC)/ Clinical Pharmacy Specialist

## 2015-07-17 VITALS — BP 172/84 | Ht 63.0 in | Wt 195.7 lb

## 2015-07-17 DIAGNOSIS — I10 Essential (primary) hypertension: Secondary | ICD-10-CM | POA: Diagnosis not present

## 2015-07-17 MED ORDER — CHLORTHALIDONE 25 MG PO TABS: 12.5000 mg | ORAL_TABLET | Freq: Every day | ORAL | Status: DC

## 2015-07-17 NOTE — Patient Instructions (Addendum)
Return for a a follow up appointment in 3 weeks  Your blood pressure today is 172/84,  Goal is < 140/90  Check your blood pressure at home daily and keep record of the readings.  Take your BP meds as follows: continue with your diltiazem twice daily, irbesartan once daily and metoprolol three times daily.  Add chlorthalidone 12.5 mg once daily. (1/2 tablet)  Go to the lab in 1 week to get blood drawn.  Bring all of your meds, your BP cuff and your record of home blood pressures to your next appointment.  Exercise as you're able, try to walk approximately 30 minutes per day.  Keep salt intake to a minimum, especially watch canned and prepared boxed foods.  Eat more fresh fruits and vegetables and fewer canned items.  Avoid eating in fast food restaurants.    HOW TO TAKE YOUR BLOOD PRESSURE: . Rest 5 minutes before taking your blood pressure. .  Don't smoke or drink caffeinated beverages for at least 30 minutes before. . Take your blood pressure before (not after) you eat. . Sit comfortably with your back supported and both feet on the floor (don't cross your legs). . Elevate your arm to heart level on a table or a desk. . Use the proper sized cuff. It should fit smoothly and snugly around your bare upper arm. There should be enough room to slip a fingertip under the cuff. The bottom edge of the cuff should be 1 inch above the crease of the elbow. . Ideally, take 3 measurements at one sitting and record the average.

## 2015-07-17 NOTE — Progress Notes (Signed)
07/17/2015 Julie Leblanc 1946-05-04 [id number]   HPI:  Julie Leblanc is a 69 y.o. female patient of Dr Debara Pickett, with a PMH below who presents today for hypertension clinic evaluation.  Cardiac Hx: CABG x 3 in ; HLD, DM  Family Hx: MI and DM in mother, father and brother  Social Hx: no alcohol, tobacco or caffeine  Diet: lives alone, eats frozen dinners many nights per week, knows high in sodium, but tries to get Healthy Choice or other healthier options; eats out on weekends with boyfriend, but no fast food  Exercise: no formal exercise, does move around house and yard regularly  Home BP readings: has Reli-on BP cuff, all home readings in past 2 weeks (10am) have been > 916 systolic.  Diastolics all WNL.  HR consistent in the 50s (metoprolol 25 mg tid)  Current antihypertensive medications: diltiazem 120 mg bid; irbesartan 300 mg qd; metoprolol 50 mg bid;    Current Outpatient Prescriptions  Medication Sig Dispense Refill  . acetaminophen (TYLENOL) 650 MG CR tablet Take 1,300 mg by mouth daily as needed for pain.    Marland Kitchen ALPRAZolam (XANAX) 1 MG tablet Take 1 mg by mouth 2 (two) times daily as needed for anxiety.     Marland Kitchen aspirin EC 81 MG tablet Take 81 mg by mouth daily.    Marland Kitchen atorvastatin (LIPITOR) 40 MG tablet Take 1 tablet by mouth daily.    . bisacodyl (DULCOLAX) 5 MG EC tablet Take 5 mg by mouth daily as needed for moderate constipation.    Marland Kitchen diltiazem (CARDIZEM SR) 120 MG 12 hr capsule Take 1 capsule (120 mg total) by mouth 2 (two) times daily. 60 capsule 11  . diphenhydramine-acetaminophen (TYLENOL PM) 25-500 MG TABS Take 1 tablet by mouth at bedtime as needed (for sleep).    . EPINEPHrine (EPI-PEN) 0.3 mg/0.3 mL SOAJ injection Inject 0.3 mg into the muscle once as needed (for peanut allergy).     . furosemide (LASIX) 40 MG tablet Take 40 mg by mouth daily as needed for fluid.     Marland Kitchen gabapentin (NEURONTIN) 300 MG capsule Take 600 mg by mouth 3 (three) times daily.     .  irbesartan (AVAPRO) 300 MG tablet Take 1 tablet (300 mg total) by mouth daily. 30 tablet 5  . Loratadine 10 MG CAPS Take 1 tablet by mouth daily.    . metFORMIN (GLUCOPHAGE-XR) 750 MG 24 hr tablet Take 1 tablet (750 mg total) by mouth daily.    . metoprolol (LOPRESSOR) 50 MG tablet Take 50 mg by mouth 3 (three) times daily.    . Multiple Vitamin (MULTIVITAMIN WITH MINERALS) TABS Take 1 tablet by mouth daily.    . nitroGLYCERIN (NITROSTAT) 0.4 MG SL tablet Place 0.4 mg under the tongue every 5 (five) minutes as needed for chest pain.    . Omega-3 Fatty Acids (FISH OIL PO) Take 1 capsule by mouth 3 (three) times daily.    . pantoprazole (PROTONIX) 40 MG tablet Take 1 tablet by mouth daily.    Marland Kitchen tiZANidine (ZANAFLEX) 4 MG tablet Take 2-4 mg by mouth daily as needed for muscle spasms.     . traMADol (ULTRAM) 50 MG tablet Take 150 mg by mouth at bedtime. Take as directed    . traZODone (DESYREL) 100 MG tablet Take 100 mg by mouth at bedtime as needed.    . venlafaxine XR (EFFEXOR-XR) 75 MG 24 hr capsule Take 1 capsule by mouth daily.  No current facility-administered medications for this visit.    Allergies  Allergen Reactions  . Other Anaphylaxis and Swelling    Nuts   . Penicillins     Past Medical History  Diagnosis Date  . Hypertension   . Hyperlipemia   . Diabetes mellitus   . GERD (gastroesophageal reflux disease)   . CAD (coronary artery disease)     hx CABG 2007, LAST NUC EF 70%-no ischemia  . Aortic stenosis, mild 10/2012    mild AS by Echo , mild MR  . Neuropathy     There were no vitals taken for this visit.    Tommy Medal PharmD CPP [facility]

## 2015-07-17 NOTE — Assessment & Plan Note (Signed)
Ms Hoefle's blood pressure remains elevated.  She states compliance with her medications.  She knows her diet probably has too much sodium, but is not willing to change her diet, as she does not wish to cook meals, just for herself.  I am going to add chlorthalidone 12.5 mg daily to her regimen.  She will get a BMET in about 1-2 weeks and I will see her in 3 weeks for follow up.

## 2015-07-29 DIAGNOSIS — Z79899 Other long term (current) drug therapy: Secondary | ICD-10-CM | POA: Diagnosis not present

## 2015-08-03 DIAGNOSIS — F329 Major depressive disorder, single episode, unspecified: Secondary | ICD-10-CM | POA: Diagnosis not present

## 2015-08-03 DIAGNOSIS — E119 Type 2 diabetes mellitus without complications: Secondary | ICD-10-CM | POA: Diagnosis not present

## 2015-08-03 DIAGNOSIS — E785 Hyperlipidemia, unspecified: Secondary | ICD-10-CM | POA: Diagnosis not present

## 2015-08-03 DIAGNOSIS — F419 Anxiety disorder, unspecified: Secondary | ICD-10-CM | POA: Diagnosis not present

## 2015-08-07 ENCOUNTER — Encounter: Payer: Self-pay | Admitting: Pharmacist Clinician (PhC)/ Clinical Pharmacy Specialist

## 2015-08-07 ENCOUNTER — Ambulatory Visit (INDEPENDENT_AMBULATORY_CARE_PROVIDER_SITE_OTHER): Payer: Commercial Managed Care - HMO | Admitting: Pharmacist Clinician (PhC)/ Clinical Pharmacy Specialist

## 2015-08-07 VITALS — BP 134/76 | HR 60 | Ht 63.0 in | Wt 195.8 lb

## 2015-08-07 DIAGNOSIS — I10 Essential (primary) hypertension: Secondary | ICD-10-CM

## 2015-08-07 MED ORDER — CHLORTHALIDONE 25 MG PO TABS: 12.5000 mg | ORAL_TABLET | Freq: Every day | ORAL | Status: DC

## 2015-08-07 NOTE — Patient Instructions (Signed)
  Your blood pressure today is 134/76 (goal is < 150/90)  Check your blood pressure at home several times per week and keep record of the readings.  Take your BP meds as follows: continue with all current medications  Bring all of your meds, your BP cuff and your record of home blood pressures to your next appointment.  Exercise as you're able, try to walk approximately 30 minutes per day.  Keep salt intake to a minimum, especially watch canned and prepared boxed foods.  Eat more fresh fruits and vegetables and fewer canned items.  Avoid eating in fast food restaurants.    HOW TO TAKE YOUR BLOOD PRESSURE: . Rest 5 minutes before taking your blood pressure. .  Don't smoke or drink caffeinated beverages for at least 30 minutes before. . Take your blood pressure before (not after) you eat. . Sit comfortably with your back supported and both feet on the floor (don't cross your legs). . Elevate your arm to heart level on a table or a desk. . Use the proper sized cuff. It should fit smoothly and snugly around your bare upper arm. There should be enough room to slip a fingertip under the cuff. The bottom edge of the cuff should be 1 inch above the crease of the elbow. . Ideally, take 3 measurements at one sitting and record the average.

## 2015-08-07 NOTE — Assessment & Plan Note (Signed)
Today her BP is much improved with the addition of the chlorthalidone.  Her home readings made a noticeable drop about 4-5 days after starting, and in the past 2 weeks has had only 5 of 24 readings that were at 150 or greater systolic.  Her diastolic readings are all WNL.  I have encouraged her to stay on the low sodium diet as well as staying active.  She is to continue with her current medications and can call if she notices any increases in her home BP readings.

## 2015-08-07 NOTE — Progress Notes (Signed)
08/07/2015 Omega Rathmann Jobin May 01, 1946 [id number]   HPI:  Julie Leblanc is a 69 y.o. female patient of Dr Debara Pickett, with a PMH below who presents today for hypertension clinic evaluation.  I saw her last month and started her on chlorthalidone 12.5 mg once daily.  She reports no problems with this other than some increased urination, but not enough to be disruptive of her day.  Cardiac Hx: CABG x 3 in ; HLD, DM  Family Hx: MI and DM in mother, father and brother  Social Hx: no alcohol, tobacco or caffeine  Diet: has cut salt from her diet where she can, still eats some prepared foods and in restaurants on weekends, but no longer adds salt to foods.  Exercise: no formal exercise, does move around house and yard regularly  Home BP readings: has Reli-on BP cuff, all home readings in past 2 weeks (10am) have been > [id number] systolic.  Diastolics all WNL.  HR consistent in the 50s (metoprolol 25 mg tid)  Current antihypertensive medications: diltiazem 120 mg bid; irbesartan 300 mg qd; metoprolol 50 mg bid; chlorthalidone 121.5 mg qd   Current Outpatient Prescriptions  Medication Sig Dispense Refill  . acetaminophen (TYLENOL) 650 MG CR tablet Take 1,300 mg by mouth daily as needed for pain.    Marland Kitchen ALPRAZolam (XANAX) 1 MG tablet Take 1 mg by mouth 2 (two) times daily as needed for anxiety.     Marland Kitchen aspirin EC 81 MG tablet Take 81 mg by mouth daily.    Marland Kitchen atorvastatin (LIPITOR) 40 MG tablet Take 1 tablet by mouth daily.    . bisacodyl (DULCOLAX) 5 MG EC tablet Take 5 mg by mouth daily as needed for moderate constipation.    . chlorthalidone (HYGROTON) 25 MG tablet Take 0.5 tablets (12.5 mg total) by mouth daily. 45 tablet 2  . diltiazem (CARDIZEM SR) 120 MG 12 hr capsule Take 1 capsule (120 mg total) by mouth 2 (two) times daily. 60 capsule 11  . diphenhydramine-acetaminophen (TYLENOL PM) 25-500 MG TABS Take 1 tablet by mouth at bedtime as needed (for sleep).    . EPINEPHrine (EPI-PEN) 0.3 mg/0.3  mL SOAJ injection Inject 0.3 mg into the muscle once as needed (for peanut allergy).     . furosemide (LASIX) 40 MG tablet Take 40 mg by mouth daily as needed for fluid.     Marland Kitchen gabapentin (NEURONTIN) 300 MG capsule Take 600 mg by mouth 3 (three) times daily.     . irbesartan (AVAPRO) 300 MG tablet Take 1 tablet (300 mg total) by mouth daily. 30 tablet 5  . Loratadine 10 MG CAPS Take 1 tablet by mouth daily.    . metFORMIN (GLUCOPHAGE-XR) 750 MG 24 hr tablet Take 1 tablet (750 mg total) by mouth daily.    . metoprolol (LOPRESSOR) 50 MG tablet Take 50 mg by mouth 3 (three) times daily.    . Multiple Vitamin (MULTIVITAMIN WITH MINERALS) TABS Take 1 tablet by mouth daily.    . nitroGLYCERIN (NITROSTAT) 0.4 MG SL tablet Place 0.4 mg under the tongue every 5 (five) minutes as needed for chest pain.    . Omega-3 Fatty Acids (FISH OIL PO) Take 1 capsule by mouth 3 (three) times daily.    . pantoprazole (PROTONIX) 40 MG tablet Take 1 tablet by mouth daily.    Marland Kitchen tiZANidine (ZANAFLEX) 4 MG tablet Take 2-4 mg by mouth daily as needed for muscle spasms.     . traMADol Veatrice Bourbon)  50 MG tablet Take 150 mg by mouth at bedtime. Take as directed    . traZODone (DESYREL) 100 MG tablet Take 100 mg by mouth at bedtime as needed.    . venlafaxine XR (EFFEXOR-XR) 75 MG 24 hr capsule Take 1 capsule by mouth daily.     No current facility-administered medications for this visit.    Allergies  Allergen Reactions  . Other Anaphylaxis and Swelling    Nuts   . Peanut Oil Shortness Of Breath  . Penicillins     Past Medical History  Diagnosis Date  . Hypertension   . Hyperlipemia   . Diabetes mellitus ([place])   . GERD (gastroesophageal reflux disease)   . CAD (coronary artery disease)     hx CABG 2007, LAST NUC EF 70%-no ischemia  . Aortic stenosis, mild 10/2012    mild AS by Echo , mild MR  . Neuropathy ([facility])     Blood pressure 134/76, pulse 60, height 5\' 3"  (1.6 m), weight 195 lb 12.8 oz (88.814  kg).    Tommy Medal PharmD CPP [facility]

## 2015-08-26 DIAGNOSIS — M159 Polyosteoarthritis, unspecified: Secondary | ICD-10-CM | POA: Diagnosis not present

## 2015-08-26 DIAGNOSIS — F339 Major depressive disorder, recurrent, unspecified: Secondary | ICD-10-CM | POA: Diagnosis not present

## 2015-08-26 DIAGNOSIS — I10 Essential (primary) hypertension: Secondary | ICD-10-CM | POA: Diagnosis not present

## 2015-08-26 DIAGNOSIS — Z0000 Encounter for general adult medical examination without abnormal findings: Secondary | ICD-10-CM | POA: Diagnosis not present

## 2015-08-26 DIAGNOSIS — E119 Type 2 diabetes mellitus without complications: Secondary | ICD-10-CM | POA: Diagnosis not present

## 2015-09-01 DIAGNOSIS — D229 Melanocytic nevi, unspecified: Secondary | ICD-10-CM | POA: Diagnosis not present

## 2015-09-02 DIAGNOSIS — E785 Hyperlipidemia, unspecified: Secondary | ICD-10-CM | POA: Diagnosis not present

## 2015-09-02 DIAGNOSIS — F419 Anxiety disorder, unspecified: Secondary | ICD-10-CM | POA: Diagnosis not present

## 2015-09-02 DIAGNOSIS — E119 Type 2 diabetes mellitus without complications: Secondary | ICD-10-CM | POA: Diagnosis not present

## 2015-09-02 DIAGNOSIS — F329 Major depressive disorder, single episode, unspecified: Secondary | ICD-10-CM | POA: Diagnosis not present

## 2015-09-16 DIAGNOSIS — L03317 Cellulitis of buttock: Secondary | ICD-10-CM | POA: Diagnosis not present

## 2015-10-09 DIAGNOSIS — M5430 Sciatica, unspecified side: Secondary | ICD-10-CM | POA: Diagnosis not present

## 2015-10-22 DIAGNOSIS — F339 Major depressive disorder, recurrent, unspecified: Secondary | ICD-10-CM | POA: Diagnosis not present

## 2015-10-22 DIAGNOSIS — G8929 Other chronic pain: Secondary | ICD-10-CM | POA: Diagnosis not present

## 2015-10-22 DIAGNOSIS — M5442 Lumbago with sciatica, left side: Secondary | ICD-10-CM | POA: Diagnosis not present

## 2015-10-29 DIAGNOSIS — F419 Anxiety disorder, unspecified: Secondary | ICD-10-CM | POA: Diagnosis not present

## 2015-10-29 DIAGNOSIS — M47816 Spondylosis without myelopathy or radiculopathy, lumbar region: Secondary | ICD-10-CM | POA: Diagnosis not present

## 2015-10-29 DIAGNOSIS — M4806 Spinal stenosis, lumbar region: Secondary | ICD-10-CM | POA: Diagnosis not present

## 2015-10-29 DIAGNOSIS — M5416 Radiculopathy, lumbar region: Secondary | ICD-10-CM | POA: Diagnosis not present

## 2015-10-29 DIAGNOSIS — M5136 Other intervertebral disc degeneration, lumbar region: Secondary | ICD-10-CM | POA: Diagnosis not present

## 2015-10-29 DIAGNOSIS — G894 Chronic pain syndrome: Secondary | ICD-10-CM | POA: Diagnosis not present

## 2015-10-31 DIAGNOSIS — M4806 Spinal stenosis, lumbar region: Secondary | ICD-10-CM | POA: Diagnosis not present

## 2015-11-25 DIAGNOSIS — F419 Anxiety disorder, unspecified: Secondary | ICD-10-CM | POA: Diagnosis not present

## 2015-11-25 DIAGNOSIS — M5416 Radiculopathy, lumbar region: Secondary | ICD-10-CM | POA: Diagnosis not present

## 2015-11-25 DIAGNOSIS — M5136 Other intervertebral disc degeneration, lumbar region: Secondary | ICD-10-CM | POA: Diagnosis not present

## 2015-11-25 DIAGNOSIS — G894 Chronic pain syndrome: Secondary | ICD-10-CM | POA: Diagnosis not present

## 2015-11-25 DIAGNOSIS — M47816 Spondylosis without myelopathy or radiculopathy, lumbar region: Secondary | ICD-10-CM | POA: Diagnosis not present

## 2015-11-25 DIAGNOSIS — M4806 Spinal stenosis, lumbar region: Secondary | ICD-10-CM | POA: Diagnosis not present

## 2015-11-27 DIAGNOSIS — J209 Acute bronchitis, unspecified: Secondary | ICD-10-CM | POA: Diagnosis not present

## 2015-11-27 DIAGNOSIS — J069 Acute upper respiratory infection, unspecified: Secondary | ICD-10-CM | POA: Diagnosis not present

## 2015-12-08 DIAGNOSIS — J069 Acute upper respiratory infection, unspecified: Secondary | ICD-10-CM | POA: Diagnosis not present

## 2015-12-08 DIAGNOSIS — Z1389 Encounter for screening for other disorder: Secondary | ICD-10-CM | POA: Diagnosis not present

## 2016-01-14 DIAGNOSIS — E119 Type 2 diabetes mellitus without complications: Secondary | ICD-10-CM | POA: Diagnosis not present

## 2016-01-14 DIAGNOSIS — E782 Mixed hyperlipidemia: Secondary | ICD-10-CM | POA: Diagnosis not present

## 2016-01-14 DIAGNOSIS — I10 Essential (primary) hypertension: Secondary | ICD-10-CM | POA: Diagnosis not present

## 2016-01-14 DIAGNOSIS — M5442 Lumbago with sciatica, left side: Secondary | ICD-10-CM | POA: Diagnosis not present

## 2016-01-14 DIAGNOSIS — G8929 Other chronic pain: Secondary | ICD-10-CM | POA: Diagnosis not present

## 2016-01-14 DIAGNOSIS — Z79899 Other long term (current) drug therapy: Secondary | ICD-10-CM | POA: Diagnosis not present

## 2016-01-27 ENCOUNTER — Encounter: Payer: Self-pay | Admitting: Internal Medicine

## 2016-03-02 DIAGNOSIS — F339 Major depressive disorder, recurrent, unspecified: Secondary | ICD-10-CM | POA: Diagnosis not present

## 2016-03-02 DIAGNOSIS — M159 Polyosteoarthritis, unspecified: Secondary | ICD-10-CM | POA: Diagnosis not present

## 2016-05-28 DIAGNOSIS — Z23 Encounter for immunization: Secondary | ICD-10-CM | POA: Diagnosis not present

## 2016-07-05 DIAGNOSIS — Z9181 History of falling: Secondary | ICD-10-CM | POA: Diagnosis not present

## 2016-07-05 DIAGNOSIS — E782 Mixed hyperlipidemia: Secondary | ICD-10-CM | POA: Diagnosis not present

## 2016-07-05 DIAGNOSIS — I10 Essential (primary) hypertension: Secondary | ICD-10-CM | POA: Diagnosis not present

## 2016-07-05 DIAGNOSIS — Z79899 Other long term (current) drug therapy: Secondary | ICD-10-CM | POA: Diagnosis not present

## 2016-07-05 DIAGNOSIS — M159 Polyosteoarthritis, unspecified: Secondary | ICD-10-CM | POA: Diagnosis not present

## 2016-07-05 DIAGNOSIS — E119 Type 2 diabetes mellitus without complications: Secondary | ICD-10-CM | POA: Diagnosis not present

## 2016-08-30 DIAGNOSIS — I10 Essential (primary) hypertension: Secondary | ICD-10-CM | POA: Diagnosis not present

## 2016-08-30 DIAGNOSIS — R600 Localized edema: Secondary | ICD-10-CM | POA: Diagnosis not present

## 2016-08-30 DIAGNOSIS — F339 Major depressive disorder, recurrent, unspecified: Secondary | ICD-10-CM | POA: Diagnosis not present

## 2016-08-30 DIAGNOSIS — M159 Polyosteoarthritis, unspecified: Secondary | ICD-10-CM | POA: Diagnosis not present

## 2016-08-30 DIAGNOSIS — Z0000 Encounter for general adult medical examination without abnormal findings: Secondary | ICD-10-CM | POA: Diagnosis not present

## 2016-09-24 DIAGNOSIS — J209 Acute bronchitis, unspecified: Secondary | ICD-10-CM | POA: Diagnosis not present

## 2016-11-17 DIAGNOSIS — I10 Essential (primary) hypertension: Secondary | ICD-10-CM | POA: Diagnosis not present

## 2016-11-17 DIAGNOSIS — H1045 Other chronic allergic conjunctivitis: Secondary | ICD-10-CM | POA: Diagnosis not present

## 2017-02-15 DIAGNOSIS — K219 Gastro-esophageal reflux disease without esophagitis: Secondary | ICD-10-CM | POA: Diagnosis not present

## 2017-02-15 DIAGNOSIS — I2510 Atherosclerotic heart disease of native coronary artery without angina pectoris: Secondary | ICD-10-CM | POA: Diagnosis not present

## 2017-02-15 DIAGNOSIS — M159 Polyosteoarthritis, unspecified: Secondary | ICD-10-CM | POA: Diagnosis not present

## 2017-02-15 DIAGNOSIS — E1165 Type 2 diabetes mellitus with hyperglycemia: Secondary | ICD-10-CM | POA: Diagnosis not present

## 2017-02-15 DIAGNOSIS — E119 Type 2 diabetes mellitus without complications: Secondary | ICD-10-CM | POA: Diagnosis not present

## 2017-02-15 DIAGNOSIS — I10 Essential (primary) hypertension: Secondary | ICD-10-CM | POA: Diagnosis not present

## 2017-02-15 DIAGNOSIS — Z6835 Body mass index (BMI) 35.0-35.9, adult: Secondary | ICD-10-CM | POA: Diagnosis not present

## 2017-02-15 DIAGNOSIS — E1140 Type 2 diabetes mellitus with diabetic neuropathy, unspecified: Secondary | ICD-10-CM | POA: Diagnosis not present

## 2017-02-15 DIAGNOSIS — F339 Major depressive disorder, recurrent, unspecified: Secondary | ICD-10-CM | POA: Diagnosis not present

## 2017-06-10 DIAGNOSIS — L0390 Cellulitis, unspecified: Secondary | ICD-10-CM | POA: Diagnosis not present

## 2017-08-16 DIAGNOSIS — Z6835 Body mass index (BMI) 35.0-35.9, adult: Secondary | ICD-10-CM | POA: Diagnosis not present

## 2017-08-16 DIAGNOSIS — Z9181 History of falling: Secondary | ICD-10-CM | POA: Diagnosis not present

## 2017-08-16 DIAGNOSIS — E119 Type 2 diabetes mellitus without complications: Secondary | ICD-10-CM | POA: Diagnosis not present

## 2017-08-16 DIAGNOSIS — Z1331 Encounter for screening for depression: Secondary | ICD-10-CM | POA: Diagnosis not present

## 2017-08-16 DIAGNOSIS — M159 Polyosteoarthritis, unspecified: Secondary | ICD-10-CM | POA: Diagnosis not present

## 2017-08-16 DIAGNOSIS — I10 Essential (primary) hypertension: Secondary | ICD-10-CM | POA: Diagnosis not present

## 2017-08-16 DIAGNOSIS — E782 Mixed hyperlipidemia: Secondary | ICD-10-CM | POA: Diagnosis not present

## 2017-08-16 DIAGNOSIS — Z79899 Other long term (current) drug therapy: Secondary | ICD-10-CM | POA: Diagnosis not present

## 2017-08-16 DIAGNOSIS — I2510 Atherosclerotic heart disease of native coronary artery without angina pectoris: Secondary | ICD-10-CM | POA: Diagnosis not present

## 2017-10-26 DIAGNOSIS — G8929 Other chronic pain: Secondary | ICD-10-CM | POA: Diagnosis not present

## 2017-10-26 DIAGNOSIS — J069 Acute upper respiratory infection, unspecified: Secondary | ICD-10-CM | POA: Diagnosis not present

## 2017-10-26 DIAGNOSIS — M5442 Lumbago with sciatica, left side: Secondary | ICD-10-CM | POA: Diagnosis not present

## 2017-10-26 DIAGNOSIS — M159 Polyosteoarthritis, unspecified: Secondary | ICD-10-CM | POA: Diagnosis not present

## 2017-10-26 DIAGNOSIS — Z6835 Body mass index (BMI) 35.0-35.9, adult: Secondary | ICD-10-CM | POA: Diagnosis not present

## 2017-11-04 DIAGNOSIS — J0100 Acute maxillary sinusitis, unspecified: Secondary | ICD-10-CM | POA: Diagnosis not present

## 2017-11-07 ENCOUNTER — Encounter: Payer: Self-pay | Admitting: Physician Assistant

## 2017-11-07 ENCOUNTER — Ambulatory Visit: Payer: Medicare HMO | Admitting: Physician Assistant

## 2017-11-07 VITALS — BP 142/80 | HR 75 | Ht 62.0 in | Wt 195.0 lb

## 2017-11-07 DIAGNOSIS — R0989 Other specified symptoms and signs involving the circulatory and respiratory systems: Secondary | ICD-10-CM

## 2017-11-07 DIAGNOSIS — R9431 Abnormal electrocardiogram [ECG] [EKG]: Secondary | ICD-10-CM | POA: Diagnosis not present

## 2017-11-07 DIAGNOSIS — I350 Nonrheumatic aortic (valve) stenosis: Secondary | ICD-10-CM | POA: Diagnosis not present

## 2017-11-07 DIAGNOSIS — E785 Hyperlipidemia, unspecified: Secondary | ICD-10-CM

## 2017-11-07 DIAGNOSIS — I25810 Atherosclerosis of coronary artery bypass graft(s) without angina pectoris: Secondary | ICD-10-CM

## 2017-11-07 DIAGNOSIS — R011 Cardiac murmur, unspecified: Secondary | ICD-10-CM

## 2017-11-07 DIAGNOSIS — L0390 Cellulitis, unspecified: Secondary | ICD-10-CM

## 2017-11-07 DIAGNOSIS — I10 Essential (primary) hypertension: Secondary | ICD-10-CM | POA: Diagnosis not present

## 2017-11-07 DIAGNOSIS — E119 Type 2 diabetes mellitus without complications: Secondary | ICD-10-CM | POA: Diagnosis not present

## 2017-11-07 MED ORDER — HYDRALAZINE HCL 25 MG PO TABS: 25.0000 mg | ORAL_TABLET | Freq: Three times a day (TID) | ORAL | 3 refills | Status: DC

## 2017-11-07 MED ORDER — NITROGLYCERIN 0.4 MG SL SUBL: 0.4000 mg | SUBLINGUAL_TABLET | SUBLINGUAL | 3 refills | Status: AC | PRN

## 2017-11-07 MED ORDER — HYDRALAZINE HCL 25 MG PO TABS: 25.0000 mg | ORAL_TABLET | Freq: Three times a day (TID) | ORAL | 0 refills | Status: DC

## 2017-11-07 NOTE — Progress Notes (Signed)
Cardiology Office Note    Date:  11/09/2017   ID:  Julie Leblanc, DOB [birth year], MRN [id number]  PCP:  Mateo Flow, MD  Cardiologist:  Dr. Debara Pickett   Chief Complaint  Patient presents with  . Follow-up    went to urgent care recently and was told she needed to follow up with her cardiologist    History of Present Illness:  Julie Leblanc is a 72 y.o. female with PMH of mild AS, CAD s/p CABG 2007 (LIMA to LAD, SVG to Diag, SVG to distal RCA) by Dr. Prescott Gum, HTN, HLD, and DM II. she had a nuclear stress test in 2010 that showed EF 70%, no ischemia.  Previous EGD showed multiple gastric ulcers.  Echocardiogram in February 2014 showed EF 60-65%, grade 1 DD, mild MR, mild AS, PA peak pressure 35 mmHg.  Her last Myoview was on 04/19/2014 which showed mild basal inferior ischemia.  She proceeded with cardiac catheterization on 04/1219/2015 which showed patent SVG to RCA with occluded native mid RCA, atretic LIMA to LAD, however LAD has been backfilled by SVG to diagonal.  Medical therapy was recommended for her hypertension.  Patient was last seen by Dr. Debara Pickett in September 2016 for uncontrolled high blood pressure.  She was later referred to hypertension clinic, chlorthalidone was added in October 2016.  Unfortunately patient has failed to follow-up with cardiology since November 2016.  Patient presents today for cardiology office visit.  She says she did not receive a reminder from [place] for followup. She denies any chest pain or arm pain that is reminiscent of the previous angina.  She has chronic anterior shin redness and warmth consistent with chronic cellulitis.  She was recently seen in urgent care for URI and received a course of doxycycline 100 mg twice daily.  EKG today shows patient has new T wave inversion in lead III and aVF, however given lack of chest discomfort, I will focus first on treating the high blood pressure and obtain echocardiogram.  She will need a repeat echocardiogram as  she has at least 2 out 6 heart murmur consistent with mitral regurgitation and also aortic regurgitation.  She also has carotid bruit as well, I ordered a carotid ultrasound.    Past Medical History:  Diagnosis Date  . Aortic stenosis, mild 10/2012   mild AS by Echo , mild MR  . CAD (coronary artery disease)    hx CABG 2007, LAST NUC EF 70%-no ischemia  . Diabetes mellitus ([place])   . GERD (gastroesophageal reflux disease)   . Hyperlipemia   . Hypertension   . Neuropathy     Past Surgical History:  Procedure Laterality Date  . ABDOMINAL HYSTERECTOMY  1990  . CORONARY ARTERY BYPASS GRAFT  01/24/2006   x 3,LIMA-LAD, VG-diag, VG-distal RCA  . GALLBLADDER SURGERY  1990's  . knee replacment  1990's  . LEFT HEART CATHETERIZATION WITH CORONARY/GRAFT ANGIOGRAM N/A 05/17/2014   Procedure: LEFT HEART CATHETERIZATION WITH Beatrix Fetters;  Surgeon: Jettie Booze, MD;  Location: [facility] CATH LAB;  Service: Cardiovascular;  Laterality: N/A;  . lung repair     s/p lap chole    Current Medications: Outpatient Medications Prior to Visit  Medication Sig Dispense Refill  . acetaminophen (TYLENOL) 650 MG CR tablet Take 1,300 mg by mouth daily as needed for pain.    Marland Kitchen ALPRAZolam (XANAX) 1 MG tablet Take 1 mg by mouth 2 (two) times daily as needed for anxiety.     Marland Kitchen  aspirin EC 81 MG tablet Take 81 mg by mouth daily.    Marland Kitchen atorvastatin (LIPITOR) 40 MG tablet Take 1 tablet by mouth daily.    . bisacodyl (DULCOLAX) 5 MG EC tablet Take 5 mg by mouth daily as needed for moderate constipation.    . celecoxib (CELEBREX) 200 MG capsule Take 1 capsule by mouth daily.    Marland Kitchen diltiazem (CARDIZEM SR) 120 MG 12 hr capsule Take 1 capsule (120 mg total) by mouth 2 (two) times daily. 60 capsule 11  . diphenhydramine-acetaminophen (TYLENOL PM) 25-500 MG TABS Take 1 tablet by mouth at bedtime as needed (for sleep).    Marland Kitchen doxycycline (VIBRAMYCIN) 100 MG capsule Take 1 capsule by mouth 2 (two) times daily.  0  .  EPINEPHrine (EPI-PEN) 0.3 mg/0.3 mL SOAJ injection Inject 0.3 mg into the muscle once as needed (for peanut allergy).     . furosemide (LASIX) 40 MG tablet Take 40 mg by mouth daily.    Marland Kitchen gabapentin (NEURONTIN) 300 MG capsule Take 600 mg by mouth 3 (three) times daily.     . irbesartan (AVAPRO) 300 MG tablet Take 1 tablet (300 mg total) by mouth daily. 30 tablet 5  . Loratadine 10 MG CAPS Take 1 tablet by mouth daily.    . metFORMIN (GLUCOPHAGE-XR) 750 MG 24 hr tablet Take 1 tablet (750 mg total) by mouth daily.    . metoprolol (LOPRESSOR) 50 MG tablet Take 50 mg by mouth 3 (three) times daily.    . Multiple Vitamin (MULTIVITAMIN WITH MINERALS) TABS Take 1 tablet by mouth daily.    . Omega-3 Fatty Acids (FISH OIL PO) Take 1 capsule by mouth 3 (three) times daily.    . pantoprazole (PROTONIX) 40 MG tablet Take 1 tablet by mouth daily.    Marland Kitchen tiZANidine (ZANAFLEX) 4 MG tablet Take 2-4 mg by mouth daily as needed for muscle spasms.     . traMADol (ULTRAM) 50 MG tablet Take 150 mg by mouth at bedtime. Take as directed    . traZODone (DESYREL) 100 MG tablet Take 100 mg by mouth at bedtime as needed.    . venlafaxine XR (EFFEXOR-XR) 75 MG 24 hr capsule Take 1 capsule by mouth daily.    . chlorthalidone (HYGROTON) 25 MG tablet Take 0.5 tablets (12.5 mg total) by mouth daily. 45 tablet 2  . nitroGLYCERIN (NITROSTAT) 0.4 MG SL tablet Place 0.4 mg under the tongue every 5 (five) minutes as needed for chest pain.     No facility-administered medications prior to visit.      Allergies:   Other; Peanut oil; and Penicillins   Social History   Socioeconomic History  . Marital status: Widowed    Spouse name: None  . Number of children: None  . Years of education: None  . Highest education level: None  Social Needs  . Financial resource strain: None  . Food insecurity - worry: None  . Food insecurity - inability: None  . Transportation needs - medical: None  . Transportation needs - non-medical: None   Occupational History  . None  Tobacco Use  . Smoking status: Never Smoker  . Smokeless tobacco: Never Used  Substance and Sexual Activity  . Alcohol use: No  . Drug use: No  . Sexual activity: None  Other Topics Concern  . None  Social History Narrative  . None     Family History:  The patient's family history includes Diabetes in her brother and mother; Diabetes (age of  onset: 37) in her father; Heart disease (age of onset: 72) in her brother; Heart disease (age of onset: 77) in her mother.   ROS:   Please see the history of present illness.    ROS All other systems reviewed and are negative.   PHYSICAL EXAM:   VS:  BP (!) 142/80 (BP Location: Left Arm, Patient Position: Sitting, Cuff Size: Normal)   Pulse 75   Ht 5\' 2"  (1.575 m)   Wt 195 lb (88.5 kg)   BMI 35.67 kg/m    GEN: Well nourished, well developed, in no acute distress  HEENT: normal  Neck: no JVD, carotid bruits, or masses Cardiac: RRR; no rubs, or gallops,no edema   +3/2 systolic murmur at RUSB and also apex Respiratory:  clear to auscultation bilaterally, normal work of breathing GI: soft, nontender, nondistended, + BS MS: no deformity or atrophy  Skin: redness and warmth in anterior shin Neuro:  Alert and Oriented x 3, Strength and sensation are intact Psych: euthymic mood, full affect  Wt Readings from Last 3 Encounters:  11/07/17 195 lb (88.5 kg)  08/07/15 195 lb 12.8 oz (88.8 kg)  07/17/15 195 lb 11.2 oz (88.8 kg)      Studies/Labs Reviewed:   EKG:  EKG is ordered today.  The ekg ordered today demonstrates normal sinus rhythm, T wave inversion in inferior leads.  Recent Labs: No results found for requested labs within last 8760 hours.   Lipid Panel No results found for: CHOL, TRIG, HDL, CHOLHDL, VLDL, LDLCALC, LDLDIRECT  Additional studies/ records that were reviewed today include:   Echo 10/24/2012 LV EF: 60% -  65%  Study Conclusions  - Left ventricle: The cavity size was at the  upper limits of normal. Wall thickness was normal. Systolic function was normal. The estimated ejection fraction was in the range of 60% to 65%. Wall motion was normal; there were no regional wall motion abnormalities. Doppler parameters are consistent with abnormal left ventricular relaxation (grade 1 diastolic dysfunction). Doppler parameters are consistent with mildly elevated mean left atrial filling pressure. - Aortic valve: Mild focal calcification involving the left coronary and noncoronary cusp, consistent with sclerosis. Calcification. Valve mobility was mildly restricted. Transvalvular velocity was minimally increased. There was mild stenosis. Mean gradient: 83mm Hg (S). Peak gradient: 78mm Hg (S). Valve area: 1.08cm^2(VTI). - Mitral valve: Moderately calcified annulus. Normal thickness leaflets . Mild regurgitation. Valve area by pressure half-time: 2.5cm^2. Valve area by continuity equation (using LVOT flow): 1.39cm^2. - Left atrium: The atrium was mildly dilated. - Atrial septum: No defect or patent foramen ovale was identified. - Pulmonary arteries: PA peak pressure: 66mm Hg (S). Impressions: - Mild aortic stenosis.   Cath 05/17/2014  IMPRESSIONS:  1. Normal left main coronary artery. 2. Moderate disease in the mid left anterior descending artery which does not appear to be significant angiographically. The LIMA to LAD is atretic. There is also backfilling of the LAD from the SVG to diagonal. 3. Widely patent left circumflex artery and its branches. 4. Occluded mid right coronary artery.  Patent SVG to RCA. 5. Normal left ventricular systolic function.  LVEDP 17 mmHg.  Ejection fraction 65%. 6.  No abdominal aortic aneurysm. No renal artery stenosis.  RECOMMENDATION:  Continue medical therapy. The anterior wall did not show any ischemia on her stress test. I don't think the LAD lesion is significant and this is probably why the  LIMA did not mature.  Medical therapy for hypertension. She will followup with Dr.  Hilty.   ASSESSMENT:    1. Murmur, heart   2. Bruit   3. Coronary artery disease involving coronary bypass graft of native heart without angina pectoris   4. Nonrheumatic aortic valve stenosis   5. Essential hypertension   6. Hyperlipidemia, unspecified hyperlipidemia type   7. Controlled type 2 diabetes mellitus without complication, without long-term current use of insulin ([place])   8. Chronic cellulitis   9. Abnormal EKG      PLAN:  In order of problems listed above:  6. Heart murmur: Patient had prior history of aortic stenosis, I recommended a repeat echocardiogram.  7. Chronic cellulitis: Patient has chronic warmth and redness in the anterior shin on both side, this is consistent with chronic cellulitis.  Patient is finishing a course of doxycycline for URI recently  8. CAD: Denies any chest pain, continue aspirin  9. Carotid bruit: We will obtain carotid Doppler  10. Hypertension: Blood pressure not very well controlled, discontinue chlorthalidone, add hydralazine 25 mg 3 times today.  11. Hyperlipidemia: On Lipitor 40 mg daily.  12. DM 2: Managed by primary care provider.  13. Abnormal EKG: Recent EKG showed T wave inversion in inferior leads, however patient completely denies any recent episode of chest pain or even dyspnea on exertion.  Therefore I will hold off on ischemic workup for the time being.  She is aware that if she does develop symptoms later, she will need to let us know.    Medication Adjustments/Labs and Tests Ordered: Current medicines are reviewed at length with the patient today.  Concerns regarding medicines are outlined above.  Medication changes, Labs and Tests ordered today are listed in the Patient Instructions below. Patient Instructions  Medication Instructions:  DISCONTINUE Chlorthalidone  START Hydralazine 25 mg take 1 tablet three a day  Labwork: None    Testing/Procedures: Your physician has requested that you have an echocardiogram. Echocardiography is a painless test that uses sound waves to create images of your heart. It provides your doctor with information about the size and shape of your heart and how well your heart's chambers and valves are working. This procedure takes approximately one hour. There are no restrictions for this procedure. [place] has requested that you have a carotid duplex. This test is an ultrasound of the carotid arteries in your neck. It looks at blood flow through these arteries that supply the brain with blood. Allow one hour for this exam. There are no restrictions or special instructions. [place] [facility]   Follow-Up: Your physician recommends that you schedule a follow-up appointment in: 3 months with Dr Debara Pickett ONLY  Any Other Special Instructions Will Be Listed Below (If Applicable). If you need a refill on your cardiac medications before your next appointment, please call your pharmacy.     Hilbert Corrigan, [place]  11/09/2017 12:30 AM    [address], [place], [place]  [address] Phone: [phone]; Fax: [phone]

## 2017-11-07 NOTE — Patient Instructions (Addendum)
Medication Instructions:  DISCONTINUE Chlorthalidone  START Hydralazine 25 mg take 1 tablet three a day  Labwork: None   Testing/Procedures: Your physician has requested that you have an echocardiogram. Echocardiography is a painless test that uses sound waves to create images of your heart. It provides your doctor with information about the size and shape of your heart and how well your heart's chambers and valves are working. This procedure takes approximately one hour. There are no restrictions for this procedure. [place] has requested that you have a carotid duplex. This test is an ultrasound of the carotid arteries in your neck. It looks at blood flow through these arteries that supply the brain with blood. Allow one hour for this exam. There are no restrictions or special instructions. [place] [facility]   Follow-Up: Your physician recommends that you schedule a follow-up appointment in: 3 months with Dr Debara Pickett ONLY  Any Other Special Instructions Will Be Listed Below (If Applicable). If you need a refill on your cardiac medications before your next appointment, please call your pharmacy.

## 2017-11-09 ENCOUNTER — Encounter: Payer: Self-pay | Admitting: Physician Assistant

## 2017-11-15 ENCOUNTER — Ambulatory Visit (HOSPITAL_COMMUNITY): Payer: Medicare HMO | Attending: Cardiovascular Disease

## 2017-11-15 DIAGNOSIS — R011 Cardiac murmur, unspecified: Secondary | ICD-10-CM | POA: Diagnosis not present

## 2017-11-15 DIAGNOSIS — I361 Nonrheumatic tricuspid (valve) insufficiency: Secondary | ICD-10-CM | POA: Insufficient documentation

## 2017-11-15 DIAGNOSIS — E119 Type 2 diabetes mellitus without complications: Secondary | ICD-10-CM | POA: Diagnosis not present

## 2017-11-15 DIAGNOSIS — I350 Nonrheumatic aortic (valve) stenosis: Secondary | ICD-10-CM | POA: Insufficient documentation

## 2017-11-18 ENCOUNTER — Telehealth: Payer: Self-pay

## 2017-11-18 NOTE — Telephone Encounter (Signed)
Per patient she she has started that new medicine she is having some shortness of breath. Stopped Chlorthalidone and started Hydralazine.

## 2017-11-22 ENCOUNTER — Ambulatory Visit (HOSPITAL_COMMUNITY)
Admission: RE | Admit: 2017-11-22 | Discharge: 2017-11-22 | Disposition: A | Discharge status: Home / Self Care | Payer: Medicare HMO | Source: Ambulatory Visit | Attending: Internal Medicine | Admitting: Internal Medicine

## 2017-11-22 DIAGNOSIS — R0989 Other specified symptoms and signs involving the circulatory and respiratory systems: Secondary | ICD-10-CM

## 2017-11-22 NOTE — Telephone Encounter (Signed)
Does the patient has worsening swelling? Is the SOB improving?

## 2017-11-22 NOTE — Progress Notes (Signed)
Mild carotid disease bilaterally.

## 2017-11-23 ENCOUNTER — Telehealth: Payer: Self-pay | Admitting: Internal Medicine

## 2017-11-23 NOTE — Telephone Encounter (Signed)
New message   Pt c/o Shortness Of Breath: STAT if SOB developed within the last 24 hours or pt is noticeably SOB on the phone  1. Are you currently SOB (can you hear that pt is SOB on the phone)? No  2. How long have you been experiencing SOB? 3 weeks  3. Are you SOB when sitting or when up moving around? Sitting and moving  4. Are you currently experiencing any other symptoms? Swelling in legs, joint pain, shoulder and back pain   Pt c/o swelling: STAT is pt has developed SOB within 24 hours  1) How much weight have you gained and in what time span? N/A  2) If swelling, where is the swelling located? Legs, feet and ankles  3) Are you currently taking a fluid pill? YES  4) Are you currently SOB? YES  5) Do you have a log of your daily weights (if so, list)? NO  6) Have you gained 3 pounds in a day or 5 pounds in a week? N/A  7) Have you traveled recently? NO

## 2017-11-23 NOTE — Telephone Encounter (Signed)
I have called the patient myself to check on her. Sounds like she is accumulating fluid since discontinuation of chlorithalidone 12.5mg  daily. Her BP also not very well controlled. I have instructed the patient to stop hydralazine and restart chlorithalidone.

## 2017-11-23 NOTE — Telephone Encounter (Signed)
Med list updated

## 2017-11-23 NOTE — Telephone Encounter (Signed)
Spoke with patient and handled the patients concerns

## 2017-11-23 NOTE — Telephone Encounter (Signed)
Returned call to patient of Dr. Debara Pickett who complains of SOB for 3 weeks and swelling. She saw [place], [place] in Feb 2019 and her chlorthalidone was stopped and hydralazine was started. She reports symptoms began after the med changes. She does not weigh daily. Her BP yesterday 177/82 (before morning meds). Attempted to review meds with patient but she isn't sure of what all she is taking.   Will route to [place] PA to review concerns and advise

## 2017-12-01 ENCOUNTER — Telehealth: Payer: Self-pay | Admitting: Physician Assistant

## 2017-12-01 NOTE — Telephone Encounter (Signed)
Called both yesterday and today to followup on how she is doing after restarting chlorthalidone, unable to reach her, left message both times.

## 2017-12-30 DIAGNOSIS — I10 Essential (primary) hypertension: Secondary | ICD-10-CM | POA: Diagnosis not present

## 2017-12-30 DIAGNOSIS — Z79899 Other long term (current) drug therapy: Secondary | ICD-10-CM | POA: Diagnosis not present

## 2017-12-30 DIAGNOSIS — M159 Polyosteoarthritis, unspecified: Secondary | ICD-10-CM | POA: Diagnosis not present

## 2017-12-30 DIAGNOSIS — Z6834 Body mass index (BMI) 34.0-34.9, adult: Secondary | ICD-10-CM | POA: Diagnosis not present

## 2017-12-30 DIAGNOSIS — E782 Mixed hyperlipidemia: Secondary | ICD-10-CM | POA: Diagnosis not present

## 2017-12-30 DIAGNOSIS — E119 Type 2 diabetes mellitus without complications: Secondary | ICD-10-CM | POA: Diagnosis not present

## 2018-01-13 ENCOUNTER — Ambulatory Visit (INDEPENDENT_AMBULATORY_CARE_PROVIDER_SITE_OTHER): Payer: Medicare HMO | Admitting: Sports Medicine

## 2018-01-13 ENCOUNTER — Encounter: Payer: Self-pay | Admitting: Sports Medicine

## 2018-01-13 DIAGNOSIS — E1100 Type 2 diabetes mellitus with hyperosmolarity without nonketotic hyperglycemic-hyperosmolar coma (NKHHC): Secondary | ICD-10-CM | POA: Diagnosis not present

## 2018-01-13 DIAGNOSIS — E1142 Type 2 diabetes mellitus with diabetic polyneuropathy: Secondary | ICD-10-CM | POA: Diagnosis not present

## 2018-01-13 DIAGNOSIS — I739 Peripheral vascular disease, unspecified: Secondary | ICD-10-CM | POA: Diagnosis not present

## 2018-01-13 DIAGNOSIS — L03032 Cellulitis of left toe: Secondary | ICD-10-CM | POA: Diagnosis not present

## 2018-01-13 MED ORDER — CLINDAMYCIN HCL 300 MG PO CAPS: 300.0000 mg | ORAL_CAPSULE | Freq: Three times a day (TID) | ORAL | 0 refills | Status: DC

## 2018-01-13 MED ORDER — NEOMYCIN-POLYMYXIN-HC 3.5-10000-1 OT SOLN: OTIC | 0 refills | Status: DC

## 2018-01-13 NOTE — Progress Notes (Signed)
Subjective: Julie Leblanc is a 72 y.o. female patient with history of diabetes who presents to office today complaining of bleeding along the left great toenail margin states that she has had a previous nail procedure at the left great toe and occasionally grows a small fragment of nail at the medial aspect of the toe about 3 weeks ago pulled the nail and experience bleeding incident has had a hard time getting it to heal up reports that she has been using peroxide, soaking Epson salt, using antibiotic cream and keeping the area covered with Band-Aid and states that it has been slow to heal even a little bit of redness around the toe.  Patient admits to history of neuropathy so cannot feel her feet however became concerned because the area is taking a very long time to heal.  Patient denies nausea, vomiting, fever, chills, night sweats or any other constitutional symptoms at this time.  Review of Systems  All other systems reviewed and are negative.  Patient Active Problem List   Diagnosis Date Noted  . Cellulitis of both lower extremities 06/17/2014  . Obesity, Class I, BMI 30.0-34.9 (see actual BMI) 06/14/2014  . Uncontrolled hypertension 05/10/2014  . Chest pain 05/10/2014  . Abnormal nuclear stress test 05/10/2014  . Preoperative cardiovascular examination 04/05/2014  . S/P CABG x 3 10/05/2013  . DM2 (diabetes mellitus, type 2) ([place]) 10/05/2013  . Dyslipidemia 10/05/2013  . Aortic valve stenosis, mild 10/05/2013  . Dizzy 03/27/2013  . CAD (coronary artery disease) 03/27/2013  . Dark stools 03/27/2013   Current Outpatient Medications on File Prior to Visit  Medication Sig Dispense Refill  . acetaminophen (TYLENOL) 650 MG CR tablet Take 1,300 mg by mouth daily as needed for pain.    Marland Kitchen ALPRAZolam (XANAX) 1 MG tablet Take 1 mg by mouth 2 (two) times daily as needed for anxiety.     Marland Kitchen aspirin EC 81 MG tablet Take 81 mg by mouth daily.    Marland Kitchen atorvastatin (LIPITOR) 40 MG tablet Take 1  tablet by mouth daily.    . bisacodyl (DULCOLAX) 5 MG EC tablet Take 5 mg by mouth daily as needed for moderate constipation.    . celecoxib (CELEBREX) 200 MG capsule Take 1 capsule by mouth daily.    . chlorthalidone (HYGROTON) 25 MG tablet Take 12.5 mg by mouth daily.    Marland Kitchen diltiazem (CARDIZEM SR) 120 MG 12 hr capsule Take 1 capsule (120 mg total) by mouth 2 (two) times daily. 60 capsule 11  . diphenhydramine-acetaminophen (TYLENOL PM) 25-500 MG TABS Take 1 tablet by mouth at bedtime as needed (for sleep).    . EPINEPHrine (EPI-PEN) 0.3 mg/0.3 mL SOAJ injection Inject 0.3 mg into the muscle once as needed (for peanut allergy).     . furosemide (LASIX) 40 MG tablet Take 40 mg by mouth daily.    Marland Kitchen gabapentin (NEURONTIN) 300 MG capsule Take 600 mg by mouth 3 (three) times daily.     . irbesartan (AVAPRO) 300 MG tablet Take 1 tablet (300 mg total) by mouth daily. 30 tablet 5  . Loratadine 10 MG CAPS Take 1 tablet by mouth daily.    . metFORMIN (GLUCOPHAGE-XR) 750 MG 24 hr tablet Take 1 tablet (750 mg total) by mouth daily.    . metoprolol (LOPRESSOR) 50 MG tablet Take 50 mg by mouth 3 (three) times daily.    . Multiple Vitamin (MULTIVITAMIN WITH MINERALS) TABS Take 1 tablet by mouth daily.    . nitroGLYCERIN (  NITROSTAT) 0.4 MG SL tablet Place 1 tablet (0.4 mg total) under the tongue every 5 (five) minutes as needed for chest pain. 25 tablet 3  . Omega-3 Fatty Acids (FISH OIL PO) Take 1 capsule by mouth 3 (three) times daily.    . pantoprazole (PROTONIX) 40 MG tablet Take 1 tablet by mouth daily.    Marland Kitchen tiZANidine (ZANAFLEX) 4 MG tablet Take 2-4 mg by mouth daily as needed for muscle spasms.     . traMADol (ULTRAM) 50 MG tablet Take 150 mg by mouth at bedtime. Take as directed    . traZODone (DESYREL) 100 MG tablet Take 100 mg by mouth at bedtime as needed.    . venlafaxine XR (EFFEXOR-XR) 75 MG 24 hr capsule Take 1 capsule by mouth daily.     No current facility-administered medications on file  prior to visit.    Allergies  Allergen Reactions  . Other Anaphylaxis and Swelling    Nuts   . Peanut Oil Shortness Of Breath  . Penicillins     No results found for this or any previous visit (from the past 2160 hour(s)).  Objective: General: Patient is awake, alert, and oriented x 3 and in no acute distress.  Integument: Skin is warm, dry and supple bilateral.  Previous nail procedure left great toenail with a small amount of fibrous tissue noted at the medial margin from patient traumatically pulling nail with a small amount of bleeding.  There is mild surrounding blanchable erythema no warmth no edema no other acute signs of infection.  All other nails are within normal limits without any acute concerns or acute ingrowing.  Vasculature:  Dorsalis Pedis pulse 1/4 bilateral. Posterior Tibial pulse  0/4 bilateral.  No ischemia, no rest pain, no gangrene. Capillary fill time <5 sec 1-5 bilateral.  No hair growth to the level of the digits. Temperature gradient within normal limits.  Mild varicosities present bilateral.  Trace edema present bilateral.   Neurology: The patient has absent sensation measured with a 5.07/10g Semmes Weinstein Monofilament at all pedal sites bilateral . Vibratory sensation diminished bilateral with tuning fork. No Babinski sign present bilateral.   Musculoskeletal: Significant digital deformity bilateral with hallux varus noted on the right. Muscular strength 4/5 in all lower extremity muscular groups bilateral without pain on range of motion . No tenderness with calf compression bilateral.  Assessment and Plan: Problem List Items Addressed This Visit      Endocrine   DM2 (diabetes mellitus, type 2) ([place])    Other Visit Diagnoses    Paronychia of great toe, left    -  Primary   Relevant Medications   neomycin-polymyxin-hydrocortisone (CORTISPORIN) OTIC solution   clindamycin (CLEOCIN) 300 MG capsule   Diabetic polyneuropathy associated with type 2  diabetes mellitus (HCC)       PVD (peripheral vascular disease) ([place])         -Examined patient. -Discussed and educated patient on diabetic foot care, especially with  regards to the vascular, neurological and musculoskeletal systems.  -Stressed the importance of good glycemic control and the detriment of not  controlling glucose levels in relation to the foot. -Mechanically debrided left great toenail wound bed using a dermal tissue curette and a tissue nipper removing all nonviable tissue then applied antibiotic cream and Band-Aid advised patient to apply the Corticosporin drops as I prescribed twice a day to the nailbed covered with a Band-Aid when in shoes otherwise leave open to air and also advised patient to take  clindamycin which I prescribed to prevent against infection at the left great toe -Answered all patient questions -Patient to return in 2 weeks for nail check if exhibits still poor healing may recommend patient to get vascular testing for further evaluation -Patient advised to call the office if any problems or questions arise in the meantime.  Landis Martins, DPM

## 2018-01-25 ENCOUNTER — Encounter: Payer: Self-pay | Admitting: Sports Medicine

## 2018-01-25 ENCOUNTER — Ambulatory Visit (INDEPENDENT_AMBULATORY_CARE_PROVIDER_SITE_OTHER): Payer: Medicare HMO | Admitting: Sports Medicine

## 2018-01-25 DIAGNOSIS — Z9889 Other specified postprocedural states: Secondary | ICD-10-CM

## 2018-01-25 DIAGNOSIS — I739 Peripheral vascular disease, unspecified: Secondary | ICD-10-CM

## 2018-01-25 DIAGNOSIS — E1100 Type 2 diabetes mellitus with hyperosmolarity without nonketotic hyperglycemic-hyperosmolar coma (NKHHC): Secondary | ICD-10-CM

## 2018-01-25 DIAGNOSIS — E1142 Type 2 diabetes mellitus with diabetic polyneuropathy: Secondary | ICD-10-CM

## 2018-01-25 NOTE — Progress Notes (Signed)
Subjective: Julie Leblanc is a 72 y.o. female patient returns to office today for follow up evaluation after having left hallux nail avulsion of fragments at the area of previous nail procedure performed on (01/13/2018). Patient has been soaking using epsom salt and applying topical antibiotic solution covered with bandaid daily.  States that it seems like it is getting better still a little sore and red.  Reports that she finished her oral antibiotics with no problems.  Patient deniesfever/chills/nausea/vomitting/any other related constitutional symptoms at this time.  Patient Active Problem List   Diagnosis Date Noted  . Cellulitis of both lower extremities 06/17/2014  . Obesity, Class I, BMI 30.0-34.9 (see actual BMI) 06/14/2014  . Uncontrolled hypertension 05/10/2014  . Chest pain 05/10/2014  . Abnormal nuclear stress test 05/10/2014  . Preoperative cardiovascular examination 04/05/2014  . S/P CABG x 3 10/05/2013  . DM2 (diabetes mellitus, type 2) ([place]) 10/05/2013  . Dyslipidemia 10/05/2013  . Aortic valve stenosis, mild 10/05/2013  . Dizzy 03/27/2013  . CAD (coronary artery disease) 03/27/2013  . Dark stools 03/27/2013    Current Outpatient Medications on File Prior to Visit  Medication Sig Dispense Refill  . acetaminophen (TYLENOL) 650 MG CR tablet Take 1,300 mg by mouth daily as needed for pain.    Marland Kitchen ALPRAZolam (XANAX) 1 MG tablet Take 1 mg by mouth 2 (two) times daily as needed for anxiety.     Marland Kitchen aspirin EC 81 MG tablet Take 81 mg by mouth daily.    Marland Kitchen atorvastatin (LIPITOR) 40 MG tablet Take 1 tablet by mouth daily.    . bisacodyl (DULCOLAX) 5 MG EC tablet Take 5 mg by mouth daily as needed for moderate constipation.    . celecoxib (CELEBREX) 200 MG capsule Take 1 capsule by mouth daily.    . chlorthalidone (HYGROTON) 25 MG tablet Take 12.5 mg by mouth daily.    . clindamycin (CLEOCIN) 300 MG capsule Take 1 capsule (300 mg total) by mouth 3 (three) times daily. 30 capsule 0  .  diltiazem (CARDIZEM SR) 120 MG 12 hr capsule Take 1 capsule (120 mg total) by mouth 2 (two) times daily. 60 capsule 11  . diphenhydramine-acetaminophen (TYLENOL PM) 25-500 MG TABS Take 1 tablet by mouth at bedtime as needed (for sleep).    . EPINEPHrine (EPI-PEN) 0.3 mg/0.3 mL SOAJ injection Inject 0.3 mg into the muscle once as needed (for peanut allergy).     . furosemide (LASIX) 40 MG tablet Take 40 mg by mouth daily.    Marland Kitchen gabapentin (NEURONTIN) 300 MG capsule Take 600 mg by mouth 3 (three) times daily.     . irbesartan (AVAPRO) 300 MG tablet Take 1 tablet (300 mg total) by mouth daily. 30 tablet 5  . Loratadine 10 MG CAPS Take 1 tablet by mouth daily.    . metFORMIN (GLUCOPHAGE-XR) 750 MG 24 hr tablet Take 1 tablet (750 mg total) by mouth daily.    . metoprolol (LOPRESSOR) 50 MG tablet Take 50 mg by mouth 3 (three) times daily.    . Multiple Vitamin (MULTIVITAMIN WITH MINERALS) TABS Take 1 tablet by mouth daily.    Marland Kitchen neomycin-polymyxin-hydrocortisone (CORTISPORIN) OTIC solution Use 1-2 drops twice daily to toe 10 mL 0  . nitroGLYCERIN (NITROSTAT) 0.4 MG SL tablet Place 1 tablet (0.4 mg total) under the tongue every 5 (five) minutes as needed for chest pain. 25 tablet 3  . Omega-3 Fatty Acids (FISH OIL PO) Take 1 capsule by mouth 3 (three) times daily.    Marland Kitchen  pantoprazole (PROTONIX) 40 MG tablet Take 1 tablet by mouth daily.    Marland Kitchen tiZANidine (ZANAFLEX) 4 MG tablet Take 2-4 mg by mouth daily as needed for muscle spasms.     . traMADol (ULTRAM) 50 MG tablet Take 150 mg by mouth at bedtime. Take as directed    . traZODone (DESYREL) 100 MG tablet Take 100 mg by mouth at bedtime as needed.    . venlafaxine XR (EFFEXOR-XR) 75 MG 24 hr capsule Take 1 capsule by mouth daily.     No current facility-administered medications on file prior to visit.     Allergies  Allergen Reactions  . Other Anaphylaxis and Swelling    Nuts   . Peanut Oil Shortness Of Breath  . Penicillins     Objective:   General: Well developed, nourished, in no acute distress, alert and oriented x3   Dermatology: Skin is warm, dry and supple bilateral.  Left hallux nail bed appears to be clean, dry, with mild granular tissue and surrounding eschar/scab.  Mild faint erythema.  No edema.  No serosanguous drainage present. The remaining nails appear unremarkable at this time except thickening at the right hallux. There are no other lesions or other signs of infection  present.  Neurovascular status: Unchanged from prior. No lower extremity swelling; No pain with calf compression bilateral.  Musculoskeletal: Decreased tenderness to palpation of the left hallux. Muscular strength within normal limits bilateral.  Significant digital deformity and varus on right.  Assesement and Plan: Problem List Items Addressed This Visit      Endocrine   DM2 (diabetes mellitus, type 2) ([place])    Other Visit Diagnoses    S/P nail surgery    -  Primary   Diabetic polyneuropathy associated with type 2 diabetes mellitus ([place])       PVD (peripheral vascular disease) ([place])          -Examined patient  -Cleansed left hallux nail bed and gently scrubbed with peroxide and q-tip/curetted away eschar at site and applied antibiotic cream covered with bandaid.  -Discussed plan of care with patient. -Patient to now begin soaking in a weak solution of Epsom salt and warm water. Patient was instructed to soak for 15-20 minutes each day until the toe appears normal and there is no drainage, redness, tenderness, or swelling at the procedure site, and apply Corticosporin solution until completed and a gauze or bandaid dressing each day as needed. May leave open to air at night. -Educated patient on long term care after nail surgery. -Patient was instructed to monitor the toe for reoccurrence and signs of infection; Patient advised to return to office or go to ER if toe becomes red, hot or swollen. -Patient is to return as needed or sooner if  problems arise.  Landis Martins, DPM

## 2018-02-07 DIAGNOSIS — J189 Pneumonia, unspecified organism: Secondary | ICD-10-CM | POA: Diagnosis not present

## 2018-02-07 DIAGNOSIS — J0100 Acute maxillary sinusitis, unspecified: Secondary | ICD-10-CM | POA: Diagnosis not present

## 2018-02-09 ENCOUNTER — Encounter: Payer: Self-pay | Admitting: Internal Medicine

## 2018-02-09 ENCOUNTER — Ambulatory Visit: Payer: Medicare HMO | Admitting: Internal Medicine

## 2018-02-09 VITALS — BP 140/82 | HR 74 | Ht 62.0 in | Wt 186.2 lb

## 2018-02-09 DIAGNOSIS — I10 Essential (primary) hypertension: Secondary | ICD-10-CM | POA: Diagnosis not present

## 2018-02-09 DIAGNOSIS — I25810 Atherosclerosis of coronary artery bypass graft(s) without angina pectoris: Secondary | ICD-10-CM | POA: Diagnosis not present

## 2018-02-09 DIAGNOSIS — I350 Nonrheumatic aortic (valve) stenosis: Secondary | ICD-10-CM | POA: Diagnosis not present

## 2018-02-09 HISTORY — DX: Atherosclerosis of coronary artery bypass graft(s) without angina pectoris: I25.810

## 2018-02-09 NOTE — Progress Notes (Signed)
OFFICE NOTE  Chief Complaint:  Follow-up echo  Primary Care Physician: Mateo Flow, MD  HPI:  Julie Leblanc is a 72 year old female previously followed by Dr. Rex Kras with a history of CABG in 2007 by Dr. Prescott Gum. This included a LIMA to the LAD, a vein graft to the diagonal and vein graft to the distal RCA. In 2010 she had a nuclear stress test that showed an EF of 70%. No ischemia. She has diabetes and dyslipidemia but good control of her diabetes on oral medications. Recently she had a knee replacement, and she is contemplating a future knee replacement as well. She is totally asymptomatic from a cardiac standpoint, denies any chest pain, worsening shortness of breath, palpitations, presyncope or syncopal symptoms.  This past summer she saw Cecilie Kicks, FNP, for dizziness and was noted to be having dark stools. Subsequently she was found to have a low hemoglobin and was referred to [facility]. She underwent EGD and colonoscopy and was found to have multiple gastric ulcers which have started to heal based on her report of a recent EGD one month ago. In addition she had a recent fall and landed on her left knee and developed a hematoma. Just has a resolving hematoma on her left cheek. She is still undergoing packing and had incision and drainage of the left knee and is bothered by warmth and swelling of the left lower extremity which was possibly concerning for cellulitis. Other changes recently included switching her diltiazem to the 120 mg SR twice daily, due to insurance changes  Julie Leblanc returns today for followup. She has responded having problems with uncontrolled hypertension as well as symptoms of stress and anxiety. In addition she's had some chest discomfort which is improved with aspirin. She's been having problems with her left knee as outlined above and then recently was told she needs left knee replacement. The surgery is coming up. I'm concerned about the fact that  she is having some discomfort in her chest and now it appears that she is having. Poorly controlled hypertension. Blood pressures are in the 130Q to 657 systolic. Recent lab work demonstrated normal renal function.  At her last visit we recommended a stress test preoperatively as she was having some chest discomfort as well. She's also had recently voided controlled hypertension. I started her on irbesartan 150 mg and and followup with our hypertension pharmacist her dose was increased to 300 mg daily. Blood pressure still remained in the 170-180 range. She is complaining of significant left knee pain. She continues to have some mild chest discomfort. Her stress test indicated reversible inferior ischemia, albeit a small area, however this could be implicated in her chest pain symptoms and recent difficult to control hypertension.  I saw Julie Leblanc back today. She seems to be doing fairly well other than complaining of ankle swelling. This is due to a combination of significant neuropathy as well as the fact that she's had veins removed from her legs for bypass. She denies any chest pain or worsening shortness of breath. Cholesterol is being followed by her primary care provider.  She has not required any additional short acting nitroglycerin.  02/09/2018  Julie Leblanc returns today for follow-up of her echo.  She had normal systolic function and very mild aortic stenosis.  This is not significantly changed compared to her prior study.  She denies any chest pain.  Currently she has a pneumonia and is on antibiotics.  She is also  been having diarrhea.  At times she gets dizzy or feels lightheaded, and I advised her to not take her Lasix while she was having loose stools.  History is normal if not mildly elevated today.  EKG shows no ischemia.  She had been dealing with chronic lower extremity swelling and erythema, but does not have evidence of cellulitis.   PMHx:  Past Medical History:  Diagnosis Date    . Aortic stenosis, mild 10/2012   mild AS by Echo , mild MR  . CAD (coronary artery disease)    hx CABG 2007, LAST NUC EF 70%-no ischemia  . Diabetes mellitus ([place])   . GERD (gastroesophageal reflux disease)   . Hyperlipemia   . Hypertension   . Neuropathy     Past Surgical History:  Procedure Laterality Date  . ABDOMINAL HYSTERECTOMY  1990  . CORONARY ARTERY BYPASS GRAFT  01/24/2006   x 3,LIMA-LAD, VG-diag, VG-distal RCA  . GALLBLADDER SURGERY  1990's  . knee replacment  1990's  . LEFT HEART CATHETERIZATION WITH CORONARY/GRAFT ANGIOGRAM N/A 05/17/2014   Procedure: LEFT HEART CATHETERIZATION WITH Beatrix Fetters;  Surgeon: Jettie Booze, MD;  Location: [facility] CATH LAB;  Service: Cardiovascular;  Laterality: N/A;  . lung repair     s/p lap chole    FAMHx:  Family History  Problem Relation Age of Onset  . Heart disease Mother 41       heart attack  . Heart disease Brother 69       heart Attack  . Diabetes Mother   . Diabetes Brother   . Diabetes Father 41       heart attack    SOCHx:   reports that she has never smoked. She has never used smokeless tobacco. She reports that she does not drink alcohol or use drugs.  ALLERGIES:  Allergies  Allergen Reactions  . Other Anaphylaxis and Swelling    Nuts   . Peanut Oil Shortness Of Breath  . Penicillins     ROS: Pertinent items noted in HPI and remainder of comprehensive ROS otherwise negative.  HOME MEDS: Current Outpatient Medications  Medication Sig Dispense Refill  . acetaminophen (TYLENOL) 650 MG CR tablet Take 1,300 mg by mouth daily as needed for pain.    Julie Leblanc Kitchen ALPRAZolam (XANAX) 1 MG tablet Take 1 mg by mouth 2 (two) times daily as needed for anxiety.     Julie Leblanc Kitchen aspirin EC 81 MG tablet Take 81 mg by mouth daily.    Julie Leblanc Kitchen atorvastatin (LIPITOR) 40 MG tablet Take 1 tablet by mouth daily.    . bisacodyl (DULCOLAX) 5 MG EC tablet Take 5 mg by mouth daily as needed for moderate constipation.    . celecoxib  (CELEBREX) 200 MG capsule Take 1 capsule by mouth daily.    . chlorthalidone (HYGROTON) 25 MG tablet Take 12.5 mg by mouth daily.    Julie Leblanc Kitchen diltiazem (CARDIZEM SR) 120 MG 12 hr capsule Take 1 capsule (120 mg total) by mouth 2 (two) times daily. 60 capsule 11  . diphenhydramine-acetaminophen (TYLENOL PM) 25-500 MG TABS Take 1 tablet by mouth at bedtime as needed (for sleep).    . EPINEPHrine (EPI-PEN) 0.3 mg/0.3 mL SOAJ injection Inject 0.3 mg into the muscle once as needed (for peanut allergy).     . furosemide (LASIX) 40 MG tablet Take 40 mg by mouth daily.    Julie Leblanc Kitchen gabapentin (NEURONTIN) 300 MG capsule Take 600 mg by mouth 3 (three) times daily.     . irbesartan (  AVAPRO) 300 MG tablet Take 1 tablet (300 mg total) by mouth daily. 30 tablet 5  . levofloxacin (LEVAQUIN) 500 MG tablet Take 1 tablet by mouth daily.    . Loratadine 10 MG CAPS Take 1 tablet by mouth daily.    . metFORMIN (GLUCOPHAGE-XR) 750 MG 24 hr tablet Take 1 tablet (750 mg total) by mouth daily.    . metoprolol (LOPRESSOR) 50 MG tablet Take 50 mg by mouth 3 (three) times daily.    . Multiple Vitamin (MULTIVITAMIN WITH MINERALS) TABS Take 1 tablet by mouth daily.    Julie Leblanc Kitchen neomycin-polymyxin-hydrocortisone (CORTISPORIN) OTIC solution Use 1-2 drops twice daily to toe 10 mL 0  . nitroGLYCERIN (NITROSTAT) 0.4 MG SL tablet Place 1 tablet (0.4 mg total) under the tongue every 5 (five) minutes as needed for chest pain. 25 tablet 3  . Omega-3 Fatty Acids (FISH OIL PO) Take 1 capsule by mouth 3 (three) times daily.    . pantoprazole (PROTONIX) 40 MG tablet Take 1 tablet by mouth daily.    Julie Leblanc Kitchen tiZANidine (ZANAFLEX) 4 MG tablet Take 2-4 mg by mouth daily as needed for muscle spasms.     . traMADol (ULTRAM) 50 MG tablet Take 150 mg by mouth at bedtime. Take as directed    . traZODone (DESYREL) 100 MG tablet Take 100 mg by mouth at bedtime as needed.    . venlafaxine XR (EFFEXOR-XR) 75 MG 24 hr capsule Take 1 capsule by mouth daily.     No current  facility-administered medications for this visit.     LABS/IMAGING: No results found for this or any previous visit (from the past 48 hour(s)). No results found.  VITALS: BP 140/82   Pulse 74   Ht 5\' 2"  (1.575 m)   Wt 186 lb 3.2 oz (84.5 kg)   BMI 34.06 kg/m   EXAM: General appearance: alert, appears older than stated age and mild distress Neck: no carotid bruit, no JVD and thyroid not enlarged, symmetric, no tenderness/mass/nodules Lungs: diminished breath sounds bilaterally and rhonchi RLL and RML Heart: regular rate and rhythm, S1, S2 normal and systolic murmur: early systolic 2/6, crescendo at 2nd right intercostal space Abdomen: soft, non-tender; bowel sounds normal; no masses,  no organomegaly Extremities: extremities normal, atraumatic, no cyanosis or edema Pulses: 2+ and symmetric Skin: Skin color, texture, turgor normal. No rashes or lesions Neurologic: Grossly normal Psych: Somewhat frustrated   EKG: Normal sinus rhythm at 74-personally reviewed  ASSESSMENT: 1. Coronary artery disease status post three-vessel CABG in 2007  2. Diabetes type 2 3. Dyslipidemia 4. Mild aortic stenosis 5. HTN  PLAN: 1.   Julie Leblanc has a number of concerns today including recent diagnosis of pneumonia.  She is currently on antibiotics and has been having loose stools.  She denies any recent fevers.  She should follow-up with her PCP and may need a less potent antibiotic or perhaps adding a probiotic to her diet.  She has mild aortic stenosis which is been stable by echo.  Blood pressure is slightly elevated today.  She should hold her Lasix while she has diarrhea and resume if she has significant weight gain of 2 to 3 pounds over couple of days.  Follow-up with me annually or sooner as necessary.  Pixie Casino, MD, [facility], [place] Director of the [facility]  [facility] Diplomate of the American Board  of Clinical Lipidology Attending Cardiologist  Direct Dial: [phone]  Fax: [phone]  Website:  [email or web address] 02/09/2018, 11:53 AM

## 2018-02-09 NOTE — Patient Instructions (Signed)
HOLD lasix when having diarrhea  Your physician wants you to follow-up in: ONE YEAR with Dr. Debara Pickett. You will receive a reminder letter in the mail two months in advance. If you don't receive a letter, please call our office to schedule the follow-up appointment.

## 2018-02-10 DIAGNOSIS — J189 Pneumonia, unspecified organism: Secondary | ICD-10-CM | POA: Diagnosis not present

## 2018-03-02 DIAGNOSIS — Z1339 Encounter for screening examination for other mental health and behavioral disorders: Secondary | ICD-10-CM | POA: Diagnosis not present

## 2018-03-02 DIAGNOSIS — Z6834 Body mass index (BMI) 34.0-34.9, adult: Secondary | ICD-10-CM | POA: Diagnosis not present

## 2018-03-02 DIAGNOSIS — F339 Major depressive disorder, recurrent, unspecified: Secondary | ICD-10-CM | POA: Diagnosis not present

## 2018-03-02 DIAGNOSIS — J309 Allergic rhinitis, unspecified: Secondary | ICD-10-CM | POA: Diagnosis not present

## 2018-03-02 DIAGNOSIS — M159 Polyosteoarthritis, unspecified: Secondary | ICD-10-CM | POA: Diagnosis not present

## 2018-07-03 DIAGNOSIS — H5000 Unspecified esotropia: Secondary | ICD-10-CM | POA: Diagnosis not present

## 2018-07-03 DIAGNOSIS — Z961 Presence of intraocular lens: Secondary | ICD-10-CM | POA: Diagnosis not present

## 2018-07-03 DIAGNOSIS — H0520 Unspecified exophthalmos: Secondary | ICD-10-CM | POA: Diagnosis not present

## 2018-07-03 DIAGNOSIS — H26492 Other secondary cataract, left eye: Secondary | ICD-10-CM | POA: Diagnosis not present

## 2018-07-03 DIAGNOSIS — H52223 Regular astigmatism, bilateral: Secondary | ICD-10-CM | POA: Diagnosis not present

## 2018-07-03 DIAGNOSIS — H26491 Other secondary cataract, right eye: Secondary | ICD-10-CM | POA: Diagnosis not present

## 2018-07-03 DIAGNOSIS — H524 Presbyopia: Secondary | ICD-10-CM | POA: Diagnosis not present

## 2018-07-03 DIAGNOSIS — H5203 Hypermetropia, bilateral: Secondary | ICD-10-CM | POA: Diagnosis not present

## 2018-07-11 DIAGNOSIS — R220 Localized swelling, mass and lump, head: Secondary | ICD-10-CM | POA: Diagnosis not present

## 2018-07-11 DIAGNOSIS — H538 Other visual disturbances: Secondary | ICD-10-CM | POA: Diagnosis not present

## 2018-07-11 DIAGNOSIS — H539 Unspecified visual disturbance: Secondary | ICD-10-CM | POA: Diagnosis not present

## 2018-07-11 DIAGNOSIS — H0520 Unspecified exophthalmos: Secondary | ICD-10-CM | POA: Diagnosis not present

## 2018-07-17 DIAGNOSIS — Z2821 Immunization not carried out because of patient refusal: Secondary | ICD-10-CM | POA: Diagnosis not present

## 2018-07-17 DIAGNOSIS — I2510 Atherosclerotic heart disease of native coronary artery without angina pectoris: Secondary | ICD-10-CM | POA: Diagnosis not present

## 2018-07-17 DIAGNOSIS — I10 Essential (primary) hypertension: Secondary | ICD-10-CM | POA: Diagnosis not present

## 2018-07-17 DIAGNOSIS — M159 Polyosteoarthritis, unspecified: Secondary | ICD-10-CM | POA: Diagnosis not present

## 2018-07-17 DIAGNOSIS — Z79899 Other long term (current) drug therapy: Secondary | ICD-10-CM | POA: Diagnosis not present

## 2018-07-17 DIAGNOSIS — E782 Mixed hyperlipidemia: Secondary | ICD-10-CM | POA: Diagnosis not present

## 2018-07-17 DIAGNOSIS — F339 Major depressive disorder, recurrent, unspecified: Secondary | ICD-10-CM | POA: Diagnosis not present

## 2018-07-17 DIAGNOSIS — Z6835 Body mass index (BMI) 35.0-35.9, adult: Secondary | ICD-10-CM | POA: Diagnosis not present

## 2018-07-17 DIAGNOSIS — E119 Type 2 diabetes mellitus without complications: Secondary | ICD-10-CM | POA: Diagnosis not present

## 2018-07-24 DIAGNOSIS — H0589 Other disorders of orbit: Secondary | ICD-10-CM | POA: Diagnosis not present

## 2018-08-23 DIAGNOSIS — F339 Major depressive disorder, recurrent, unspecified: Secondary | ICD-10-CM | POA: Diagnosis not present

## 2018-08-23 DIAGNOSIS — Z9181 History of falling: Secondary | ICD-10-CM | POA: Diagnosis not present

## 2018-08-23 DIAGNOSIS — Z1331 Encounter for screening for depression: Secondary | ICD-10-CM | POA: Diagnosis not present

## 2018-08-23 DIAGNOSIS — Z0000 Encounter for general adult medical examination without abnormal findings: Secondary | ICD-10-CM | POA: Diagnosis not present

## 2018-08-23 DIAGNOSIS — Z6835 Body mass index (BMI) 35.0-35.9, adult: Secondary | ICD-10-CM | POA: Diagnosis not present

## 2018-08-23 DIAGNOSIS — M159 Polyosteoarthritis, unspecified: Secondary | ICD-10-CM | POA: Diagnosis not present

## 2018-09-28 DIAGNOSIS — H0520 Unspecified exophthalmos: Secondary | ICD-10-CM | POA: Diagnosis not present

## 2018-09-28 DIAGNOSIS — Z961 Presence of intraocular lens: Secondary | ICD-10-CM | POA: Diagnosis not present

## 2018-09-28 DIAGNOSIS — H532 Diplopia: Secondary | ICD-10-CM | POA: Diagnosis not present

## 2018-09-28 DIAGNOSIS — H43823 Vitreomacular adhesion, bilateral: Secondary | ICD-10-CM | POA: Diagnosis not present

## 2018-09-28 DIAGNOSIS — H0589 Other disorders of orbit: Secondary | ICD-10-CM | POA: Diagnosis not present

## 2018-10-25 DIAGNOSIS — J209 Acute bronchitis, unspecified: Secondary | ICD-10-CM | POA: Diagnosis not present

## 2019-02-09 ENCOUNTER — Ambulatory Visit: Payer: Medicare HMO | Admitting: Internal Medicine

## 2019-02-13 DIAGNOSIS — Z79899 Other long term (current) drug therapy: Secondary | ICD-10-CM | POA: Diagnosis not present

## 2019-02-13 DIAGNOSIS — E1140 Type 2 diabetes mellitus with diabetic neuropathy, unspecified: Secondary | ICD-10-CM | POA: Diagnosis not present

## 2019-02-13 DIAGNOSIS — E1169 Type 2 diabetes mellitus with other specified complication: Secondary | ICD-10-CM | POA: Diagnosis not present

## 2019-02-13 DIAGNOSIS — E782 Mixed hyperlipidemia: Secondary | ICD-10-CM | POA: Diagnosis not present

## 2019-02-13 DIAGNOSIS — E1165 Type 2 diabetes mellitus with hyperglycemia: Secondary | ICD-10-CM | POA: Diagnosis not present

## 2019-02-13 DIAGNOSIS — M159 Polyosteoarthritis, unspecified: Secondary | ICD-10-CM | POA: Diagnosis not present

## 2019-02-13 DIAGNOSIS — E785 Hyperlipidemia, unspecified: Secondary | ICD-10-CM | POA: Diagnosis not present

## 2019-02-13 DIAGNOSIS — Z6835 Body mass index (BMI) 35.0-35.9, adult: Secondary | ICD-10-CM | POA: Diagnosis not present

## 2019-02-13 DIAGNOSIS — R252 Cramp and spasm: Secondary | ICD-10-CM | POA: Diagnosis not present

## 2019-04-20 DIAGNOSIS — L89619 Pressure ulcer of right heel, unspecified stage: Secondary | ICD-10-CM | POA: Diagnosis not present

## 2019-05-09 DIAGNOSIS — E11621 Type 2 diabetes mellitus with foot ulcer: Secondary | ICD-10-CM | POA: Diagnosis not present

## 2019-05-09 DIAGNOSIS — Z6836 Body mass index (BMI) 36.0-36.9, adult: Secondary | ICD-10-CM | POA: Diagnosis not present

## 2019-05-09 DIAGNOSIS — L97509 Non-pressure chronic ulcer of other part of unspecified foot with unspecified severity: Secondary | ICD-10-CM | POA: Diagnosis not present

## 2019-05-15 ENCOUNTER — Telehealth: Payer: Self-pay | Admitting: Internal Medicine

## 2019-05-15 NOTE — Telephone Encounter (Signed)
Called patient, advised her that unfortunately due to the circumstances in the office they can not have extra people come into the office and wait in the lobby while she is in the visit. Patient verbalized understanding.

## 2019-05-15 NOTE — Telephone Encounter (Signed)
Patient has appt with Dr. Debara Pickett tomorrow 8/26, she wants to know if her boyfriend can come with her to the appt.

## 2019-05-16 ENCOUNTER — Ambulatory Visit (INDEPENDENT_AMBULATORY_CARE_PROVIDER_SITE_OTHER): Payer: Medicare HMO | Admitting: Internal Medicine

## 2019-05-16 ENCOUNTER — Other Ambulatory Visit: Payer: Self-pay

## 2019-05-16 ENCOUNTER — Encounter: Payer: Self-pay | Admitting: Internal Medicine

## 2019-05-16 VITALS — BP 124/70 | HR 71 | Temp 97.2°F | Ht 62.0 in | Wt 199.6 lb

## 2019-05-16 DIAGNOSIS — I4891 Unspecified atrial fibrillation: Secondary | ICD-10-CM | POA: Diagnosis not present

## 2019-05-16 DIAGNOSIS — I25810 Atherosclerosis of coronary artery bypass graft(s) without angina pectoris: Secondary | ICD-10-CM

## 2019-05-16 DIAGNOSIS — Z01812 Encounter for preprocedural laboratory examination: Secondary | ICD-10-CM | POA: Diagnosis not present

## 2019-05-16 DIAGNOSIS — I350 Nonrheumatic aortic (valve) stenosis: Secondary | ICD-10-CM

## 2019-05-16 DIAGNOSIS — I10 Essential (primary) hypertension: Secondary | ICD-10-CM

## 2019-05-16 MED ORDER — APIXABAN 5 MG PO TABS: 5.0000 mg | ORAL_TABLET | Freq: Two times a day (BID) | ORAL | 11 refills | Status: DC

## 2019-05-16 MED ORDER — ICOSAPENT ETHYL 1 G PO CAPS: 2.0000 | ORAL_CAPSULE | Freq: Two times a day (BID) | ORAL | 11 refills | Status: DC

## 2019-05-16 NOTE — Patient Instructions (Addendum)
Medication Instructions:  START eliquis 5mg  twice daily START vascepa 2 gram twice daily - 2 capsules in AM, 2 capsules in PM >> patient assistance for vascepa - online application  -- [email or web address] >> disease funds >> hypercholesterolemia Use celebrex sparingling  If you need a refill on your cardiac medications before your next appointment, please call your pharmacy.   Lab work: CBC, BMET to be completed 3-5 days prior to procedure date  If you have labs (blood work) drawn today and your tests are completely normal, you will receive your results only by: Marland Kitchen MyChart Message (if you have MyChart) OR . A paper copy in the mail If you have any lab test that is abnormal or we need to change your treatment, we will call you to review the results.  Testing/Procedures: Cardioversion with Dr. Debara Pickett on September 30th @ [place]: At [facility], you and your health needs are our priority.  As part of our continuing mission to provide you with exceptional heart care, we have created designated Provider Care Teams.  These Care Teams include your primary Cardiologist (physician) and Advanced Practice Providers (APPs -  Physician Assistants and Nurse Practitioners) who all work together to provide you with the care you need, when you need it. You will need a follow up appointment mid-late October in the office. You may see Dr. Debara Pickett or one of the following Advanced Practice Providers on your designated Care Team: Almyra Deforest, [place] . Fabian Sharp, PA-C  Any Other Special Instructions Will Be Listed Below (If Applicable).   You are scheduled for a Cardioversion on 06/20/2019 with Dr. Debara Pickett.  Please arrive at the [facility] (Main Entrance A) at [facility]: [place], [place] [address] at 9 am (procedure time 10am)  DIET: Nothing to eat or drink after midnight except a sip of water with medications (see medication instructions below)  Medication  Instructions: Hold Metformin morning of procedure  Continue your anticoagulant: Eliquis You will need to continue your anticoagulant after your procedure until you are told by your  provider that it is safe to stop   Labs: CBC, BMET to be completed about 1 week prior to procedure  COVID19 screening: this is to be completed prior to your procedure @ [facility] ([facility]) @ 10:45am. Please bring ID and insurance card. This is a drive-thru (do not get out of car)  You must have a responsible person to drive you home and stay in the waiting area during your procedure. Failure to do so could result in cancellation.  Bring your insurance cards.  *Special Note: Every effort is made to have your procedure done on time. Occasionally there are emergencies that occur at the hospital that may cause delays. Please be patient if a delay does occur.

## 2019-05-16 NOTE — Progress Notes (Signed)
OFFICE NOTE  Chief Complaint:  Routine follow-up  Primary Care Physician: Mateo Flow, MD  HPI:  Julie Leblanc is a 73 year old female previously followed by Dr. Rex Kras with a history of CABG in 2007 by Dr. Prescott Gum. This included a LIMA to the LAD, a vein graft to the diagonal and vein graft to the distal RCA. In 2010 she had a nuclear stress test that showed an EF of 70%. No ischemia. She has diabetes and dyslipidemia but good control of her diabetes on oral medications. Recently she had a knee replacement, and she is contemplating a future knee replacement as well. She is totally asymptomatic from a cardiac standpoint, denies any chest pain, worsening shortness of breath, palpitations, presyncope or syncopal symptoms.  This past summer she saw Cecilie Kicks, FNP, for dizziness and was noted to be having dark stools. Subsequently she was found to have a low hemoglobin and was referred to [facility]. She underwent EGD and colonoscopy and was found to have multiple gastric ulcers which have started to heal based on her report of a recent EGD one month ago. In addition she had a recent fall and landed on her left knee and developed a hematoma. Just has a resolving hematoma on her left cheek. She is still undergoing packing and had incision and drainage of the left knee and is bothered by warmth and swelling of the left lower extremity which was possibly concerning for cellulitis. Other changes recently included switching her diltiazem to the 120 mg SR twice daily, due to insurance changes  Julie Leblanc returns today for followup. She has responded having problems with uncontrolled hypertension as well as symptoms of stress and anxiety. In addition she's had some chest discomfort which is improved with aspirin. She's been having problems with her left knee as outlined above and then recently was told she needs left knee replacement. The surgery is coming up. I'm concerned about the fact  that she is having some discomfort in her chest and now it appears that she is having. Poorly controlled hypertension. Blood pressures are in the A999333 to A999333 systolic. Recent lab work demonstrated normal renal function.  At her last visit we recommended a stress test preoperatively as she was having some chest discomfort as well. She's also had recently voided controlled hypertension. I started her on irbesartan 150 mg and and followup with our hypertension pharmacist her dose was increased to 300 mg daily. Blood pressure still remained in the 170-180 range. She is complaining of significant left knee pain. She continues to have some mild chest discomfort. Her stress test indicated reversible inferior ischemia, albeit a small area, however this could be implicated in her chest pain symptoms and recent difficult to control hypertension.  I saw Julie Leblanc back today. She seems to be doing fairly well other than complaining of ankle swelling. This is due to a combination of significant neuropathy as well as the fact that she's had veins removed from her legs for bypass. She denies any chest pain or worsening shortness of breath. Cholesterol is being followed by her primary care provider.  She has not required any additional short acting nitroglycerin.  02/09/2018  Julie Leblanc returns today for follow-up of her echo.  She had normal systolic function and very mild aortic stenosis.  This is not significantly changed compared to her prior study.  She denies any chest pain.  Currently she has a pneumonia and is on antibiotics.  She is also  been having diarrhea.  At times she gets dizzy or feels lightheaded, and I advised her to not take her Lasix while she was having loose stools.  History is normal if not mildly elevated today.  EKG shows no ischemia.  She had been dealing with chronic lower extremity swelling and erythema, but does not have evidence of cellulitis.   05/16/2019  Julie Leblanc is seen today for  follow-up.  Routine EKG was performed which shows atrial fibrillation at 71.  She is unaware of this denies palpitations or worsening fatigue.  It may be that she is rate controlled due to the fact she is on diltiazem and metoprolol.  She is at increased risk of stroke in fact her CHADVASC score is 4.  She is currently on low-dose aspirin.  She says she is been having problems with 1 of her eyes and was found to have a mass behind it.  Her ophthalmologist is possibly contemplating removing it.  She also saw her PCP recently and had some blood work.  Her total cholesterol was 199, triglycerides 303, LDL 93 and HDL 45.  Her goal LDL is less than 90 and she already is on high potency atorvastatin.  PMHx:  Past Medical History:  Diagnosis Date   Aortic stenosis, mild 10/2012   mild AS by Echo , mild MR   CAD (coronary artery disease)    hx CABG 2007, LAST NUC EF 70%-no ischemia   Diabetes mellitus (HCC)    GERD (gastroesophageal reflux disease)    Hyperlipemia    Hypertension    Neuropathy     Past Surgical History:  Procedure Laterality Date   ABDOMINAL HYSTERECTOMY  1990   CORONARY ARTERY BYPASS GRAFT  01/24/2006   x 3,LIMA-LAD, VG-diag, VG-distal RCA   GALLBLADDER SURGERY  1990's   knee replacment  1990's   LEFT HEART CATHETERIZATION WITH CORONARY/GRAFT ANGIOGRAM N/A 05/17/2014   Procedure: LEFT HEART CATHETERIZATION WITH Beatrix Fetters;  Surgeon: Jettie Booze, MD;  Location: [facility] CATH LAB;  Service: Cardiovascular;  Laterality: N/A;   lung repair     s/p lap chole    FAMHx:  Family History  Problem Relation Age of Onset   Heart disease Mother 33       heart attack   Heart disease Brother 20       heart Attack   Diabetes Mother    Diabetes Brother    Diabetes Father 84       heart attack    SOCHx:   reports that she has never smoked. She has never used smokeless tobacco. She reports that she does not drink alcohol or use drugs.  ALLERGIES:    Allergies  Allergen Reactions   Other Anaphylaxis and Swelling    Nuts    Peanut Oil Shortness Of Breath   Penicillins     ROS: Pertinent items noted in HPI and remainder of comprehensive ROS otherwise negative.  HOME MEDS: Current Outpatient Medications  Medication Sig Dispense Refill   acetaminophen (TYLENOL) 650 MG CR tablet Take 1,300 mg by mouth daily as needed for pain.     ALPRAZolam (XANAX) 1 MG tablet Take 1 mg by mouth 2 (two) times daily as needed for anxiety.      aspirin EC 81 MG tablet Take 81 mg by mouth daily.     atorvastatin (LIPITOR) 40 MG tablet Take 1 tablet by mouth daily.     bisacodyl (DULCOLAX) 5 MG EC tablet Take 5 mg by mouth daily  as needed for moderate constipation.     celecoxib (CELEBREX) 200 MG capsule Take 1 capsule by mouth daily.     chlorthalidone (HYGROTON) 25 MG tablet Take 12.5 mg by mouth daily.     diltiazem (CARDIZEM SR) 120 MG 12 hr capsule Take 1 capsule (120 mg total) by mouth 2 (two) times daily. 60 capsule 11   diphenhydramine-acetaminophen (TYLENOL PM) 25-500 MG TABS Take 1 tablet by mouth at bedtime as needed (for sleep).     EPINEPHrine (EPI-PEN) 0.3 mg/0.3 mL SOAJ injection Inject 0.3 mg into the muscle once as needed (for peanut allergy).      furosemide (LASIX) 40 MG tablet Take 40 mg by mouth daily.     gabapentin (NEURONTIN) 300 MG capsule Take 600 mg by mouth 3 (three) times daily.      irbesartan (AVAPRO) 300 MG tablet Take 1 tablet (300 mg total) by mouth daily. 30 tablet 5   Loratadine 10 MG CAPS Take 1 tablet by mouth daily.     metFORMIN (GLUCOPHAGE-XR) 750 MG 24 hr tablet Take 1 tablet (750 mg total) by mouth daily.     metoprolol (LOPRESSOR) 50 MG tablet Take 50 mg by mouth 3 (three) times daily.     Multiple Vitamin (MULTIVITAMIN WITH MINERALS) TABS Take 1 tablet by mouth daily.     nitroGLYCERIN (NITROSTAT) 0.4 MG SL tablet Place 1 tablet (0.4 mg total) under the tongue every 5 (five) minutes as  needed for chest pain. 25 tablet 3   Omega-3 Fatty Acids (FISH OIL PO) Take 1 capsule by mouth 3 (three) times daily.     pantoprazole (PROTONIX) 40 MG tablet Take 1 tablet by mouth daily.     tiZANidine (ZANAFLEX) 4 MG tablet Take 2-4 mg by mouth daily as needed for muscle spasms.      traMADol (ULTRAM) 50 MG tablet Take 150 mg by mouth at bedtime. Take as directed     traZODone (DESYREL) 100 MG tablet Take 100 mg by mouth at bedtime as needed.     venlafaxine XR (EFFEXOR-XR) 75 MG 24 hr capsule Take 1 capsule by mouth daily.     No current facility-administered medications for this visit.     LABS/IMAGING: No results found for this or any previous visit (from the past 48 hour(s)). No results found.  VITALS: BP 124/70    Pulse 71    Temp (!) 97.2 F (36.2 C)    Ht 5\' 2"  (1.575 m)    Wt 199 lb 9.6 oz (90.5 kg)    SpO2 98%    BMI 36.51 kg/m   EXAM: General appearance: alert, appears older than stated age and mild distress Neck: no carotid bruit, no JVD and thyroid not enlarged, symmetric, no tenderness/mass/nodules Lungs: diminished breath sounds bilaterally and rhonchi RLL and RML Heart: irregularly irregular rhythm, S1, S2 normal and systolic murmur: early systolic 2/6, crescendo at 2nd right intercostal space Abdomen: soft, non-tender; bowel sounds normal; no masses,  no organomegaly Extremities: extremities normal, atraumatic, no cyanosis or edema Pulses: 2+ and symmetric Skin: Skin color, texture, turgor normal. No rashes or lesions Neurologic: Grossly normal Psych: Pleasant   EKG: A. fib at 71-personally reviewed  ASSESSMENT: 1. New onset atrial fibrillation-CHADSVASC score of 4 2. Coronary artery disease status post three-vessel CABG in 2007  3. Diabetes type 2 4. Dyslipidemia 5. Mild aortic stenosis 6. HTN  PLAN: 1.   Julie Leblanc was found to have new onset atrial fibrillation today with a chads  BASC score of 4.  She is at increased stroke risk.  I recommend  starting Eliquis 5 mg twice daily.  She will continue on aspirin for her coronary disease and remote CABG in 2007.  She was taking Celebrex 3 times a week I advised her to discontinue that.  She does have a history of some gastric ulcers in the past and is on a proton pump inhibitor.  Those have likely healed.  We'll need to monitor for any adverse bleeding.  I would recommend 3 to 4 weeks of anticoagulation and pursuing elective cardioversion in about a month.  This will likely delay any eye surgeries for at least 2 months before we could consider coming off of anticoagulation.  She is in agreement with this plan.  Finally, triglycerides remain significantly elevated and LDL is above goal.  She is already on high potency statin.  She may be a candidate for Vascepa.  If this is approved she would discontinue her over-the-counter fish oil.  We will try to get prior authorization to start 2 g twice daily.  Follow-up with me in 1 month.  Pixie Casino, MD, [facility], [place] Director of the Advanced Lipid Disorders &  Cardiovascular Risk Reduction Clinic Diplomate of the American Board of Clinical Lipidology Attending Cardiologist  Direct Dial: [phone]   Fax: [phone]  Website:  [email or web address]   Nadean Corwin Mark Benecke 05/16/2019, 2:10 PM

## 2019-05-17 ENCOUNTER — Telehealth: Payer: Self-pay | Admitting: Internal Medicine

## 2019-05-17 MED ORDER — OMEGA-3-ACID ETHYL ESTERS 1 G PO CAPS: 2.0000 g | ORAL_CAPSULE | Freq: Two times a day (BID) | ORAL | 1 refills | Status: DC

## 2019-05-17 NOTE — Telephone Encounter (Signed)
Called patient, she was advised of change of medication. Verified pharmacy and sent over medication. Patient verbalized understanding.

## 2019-05-17 NOTE — Telephone Encounter (Signed)
  Patient states she was prescribed Vascepa at last visit but when she went to pick it up it was over $200. She said [employer] will not pay for it since it is not generic and she cannot afford it. She would like to know if there is something else that can be prescribed for her in its place

## 2019-05-17 NOTE — Telephone Encounter (Signed)
Please advise. Thank you

## 2019-05-17 NOTE — Telephone Encounter (Signed)
lovaza 2g twice daily

## 2019-05-18 ENCOUNTER — Other Ambulatory Visit: Payer: Self-pay

## 2019-05-18 ENCOUNTER — Encounter (HOSPITAL_COMMUNITY): Payer: Self-pay | Admitting: Emergency Medicine

## 2019-05-18 ENCOUNTER — Emergency Department (HOSPITAL_COMMUNITY)
Admission: EM | Admit: 2019-05-18 | Discharge: 2019-05-19 | Disposition: A | Discharge status: Home / Self Care | Payer: Medicare HMO | Attending: Emergency Medicine | Admitting: Emergency Medicine

## 2019-05-18 ENCOUNTER — Telehealth: Payer: Self-pay | Admitting: Cardiology

## 2019-05-18 DIAGNOSIS — Z951 Presence of aortocoronary bypass graft: Secondary | ICD-10-CM | POA: Diagnosis not present

## 2019-05-18 DIAGNOSIS — Z7984 Long term (current) use of oral hypoglycemic drugs: Secondary | ICD-10-CM | POA: Diagnosis not present

## 2019-05-18 DIAGNOSIS — K625 Hemorrhage of anus and rectum: Secondary | ICD-10-CM | POA: Insufficient documentation

## 2019-05-18 DIAGNOSIS — Z7901 Long term (current) use of anticoagulants: Secondary | ICD-10-CM | POA: Insufficient documentation

## 2019-05-18 DIAGNOSIS — I2510 Atherosclerotic heart disease of native coronary artery without angina pectoris: Secondary | ICD-10-CM | POA: Diagnosis not present

## 2019-05-18 DIAGNOSIS — Z7982 Long term (current) use of aspirin: Secondary | ICD-10-CM | POA: Diagnosis not present

## 2019-05-18 DIAGNOSIS — I10 Essential (primary) hypertension: Secondary | ICD-10-CM | POA: Insufficient documentation

## 2019-05-18 DIAGNOSIS — Z79899 Other long term (current) drug therapy: Secondary | ICD-10-CM | POA: Insufficient documentation

## 2019-05-18 DIAGNOSIS — E1140 Type 2 diabetes mellitus with diabetic neuropathy, unspecified: Secondary | ICD-10-CM | POA: Insufficient documentation

## 2019-05-18 DIAGNOSIS — Z91010 Allergy to peanuts: Secondary | ICD-10-CM | POA: Diagnosis not present

## 2019-05-18 NOTE — Telephone Encounter (Signed)
Telephone note  Patient called at approximately 10pm, reports that's she started eliquis yesterday for anticoagulation in the setting of afib. This evening, she started having black, loose stools. Has had 3 episodes in the last 3 hours. No light headedness, dizziness, chest pain, shortness of breath. She does report a history of ulcers but has not had black stools recently until this evening. Discussed with the patient that this may represent upper GI bleed and that she should come to the emergency room immediately. She noted understanding and reported that she will get a ride to bring her.   Bryna Colander Cardiology Fellow, PGY-9

## 2019-05-18 NOTE — ED Triage Notes (Signed)
Pt st's she started on Eliquis yesterday tonight st's she started having black stools  Pt st's she has had 3 black stools.

## 2019-05-19 LAB — CBC WITH DIFFERENTIAL/PLATELET
Abs Immature Granulocytes: 0.04 10*3/uL (ref 0.00–0.07)
Basophils Absolute: 0.1 10*3/uL (ref 0.0–0.1)
Basophils Relative: 1 %
Eosinophils Absolute: 0.2 10*3/uL (ref 0.0–0.5)
Eosinophils Relative: 2 %
HCT: 39.3 % (ref 36.0–46.0)
Hemoglobin: 12.4 g/dL (ref 12.0–15.0)
Immature Granulocytes: 1 %
Lymphocytes Relative: 38 %
Lymphs Abs: 3.3 10*3/uL (ref 0.7–4.0)
MCH: 31.6 pg (ref 26.0–34.0)
MCHC: 31.6 g/dL (ref 30.0–36.0)
MCV: 100.3 fL — ABNORMAL HIGH (ref 80.0–100.0)
Monocytes Absolute: 0.9 10*3/uL (ref 0.1–1.0)
Monocytes Relative: 10 %
Neutro Abs: 4.3 10*3/uL (ref 1.7–7.7)
Neutrophils Relative %: 48 %
Platelets: 244 10*3/uL (ref 150–400)
RBC: 3.92 MIL/uL (ref 3.87–5.11)
RDW: 13.5 % (ref 11.5–15.5)
WBC: 8.8 10*3/uL (ref 4.0–10.5)
nRBC: 0 % (ref 0.0–0.2)

## 2019-05-19 LAB — COMPREHENSIVE METABOLIC PANEL
ALT: 32 U/L (ref 0–44)
AST: 20 U/L (ref 15–41)
Albumin: 4 g/dL (ref 3.5–5.0)
Alkaline Phosphatase: 84 U/L (ref 38–126)
Anion gap: 11 (ref 5–15)
BUN: 14 mg/dL (ref 8–23)
CO2: 28 mmol/L (ref 22–32)
Calcium: 9.5 mg/dL (ref 8.9–10.3)
Chloride: 102 mmol/L (ref 98–111)
Creatinine, Ser: 0.78 mg/dL (ref 0.44–1.00)
GFR calc Af Amer: >60 mL/min (ref >60)
GFR calc non Af Amer: >60 mL/min (ref >60)
Glucose, Bld: 148 mg/dL — ABNORMAL HIGH (ref 70–99)
Potassium: 3.9 mmol/L (ref 3.5–5.1)
Sodium: 141 mmol/L (ref 135–145)
Total Bilirubin: 0.3 mg/dL (ref 0.3–1.2)
Total Protein: 7 g/dL (ref 6.5–8.1)

## 2019-05-19 LAB — CBC
HCT: 38.7 % (ref 36.0–46.0)
Hemoglobin: 12.1 g/dL (ref 12.0–15.0)
MCH: 31.3 pg (ref 26.0–34.0)
MCHC: 31.3 g/dL (ref 30.0–36.0)
MCV: 100.3 fL — ABNORMAL HIGH (ref 80.0–100.0)
Platelets: 223 10*3/uL (ref 150–400)
RBC: 3.86 MIL/uL — ABNORMAL LOW (ref 3.87–5.11)
RDW: 13.4 % (ref 11.5–15.5)
WBC: 9.2 10*3/uL (ref 4.0–10.5)
nRBC: 0 % (ref 0.0–0.2)

## 2019-05-19 LAB — TYPE AND SCREEN
ABO/RH(D): A POS
Antibody Screen: NEGATIVE

## 2019-05-19 LAB — ABO/RH: ABO/RH(D): A POS

## 2019-05-19 LAB — POC OCCULT BLOOD, ED: Fecal Occult Bld: NEGATIVE

## 2019-05-19 NOTE — ED Provider Notes (Signed)
[facility] EM[facility]MENT Provider Note   CSN: [id number] Arrival date & time: 05/18/19  2336     History   Chief Complaint Chief Complaint  Patient presents with  . Rectal Bleeding    HPI Julie Leblanc is a 73 y.o. female.     The history is provided by the patient and a significant other.  Rectal Bleeding Quality:  Black and tarry Amount:  Moderate Timing:  Intermittent Chronicity:  New Similar prior episodes: yes   Relieved by: imodium. Worsened by:  Defecation Associated symptoms: no abdominal pain, no dizziness, no fever, no hematemesis, no light-headedness, no loss of consciousness and no vomiting   Risk factors: anticoagulant use   Patient presents for possible rectal bleeding.  She reports she had 3 black stools at home.  No vomiting.  No abdominal pain.  No dizziness.  She just started Eliquis this week for atrial fibrillation.  Past Medical History:  Diagnosis Date  . Aortic stenosis, mild 10/2012   mild AS by Echo , mild MR  . CAD (coronary artery disease)    hx CABG 2007, LAST NUC EF 70%-no ischemia  . Diabetes mellitus ([facility])   . GERD (gastroesophageal reflux disease)   . Hyperlipemia   . Hypertension   . Neuropathy     Patient Active Problem List   Diagnosis Date Noted  . Coronary artery disease involving coronary bypass graft of native heart without angina pectoris 02/09/2018  . Cellulitis of both lower extremities 06/17/2014  . Obesity, Class I, BMI 30.0-34.9 (see actual BMI) 06/14/2014  . Essential hypertension 05/10/2014  . Chest pain 05/10/2014  . Abnormal nuclear stress test 05/10/2014  . Preoperative cardiovascular examination 04/05/2014  . S/P CABG x 3 10/05/2013  . DM2 (diabetes mellitus, type 2) ([facility]) 10/05/2013  . Dyslipidemia 10/05/2013  . Aortic valve stenosis 10/05/2013  . Dizzy 03/27/2013  . CAD (coronary artery disease) 03/27/2013  . Dark stools 03/27/2013    Past Surgical History:  Procedure  Laterality Date  . ABDOMINAL HYSTERECTOMY  1990  . CORONARY ARTERY BYPASS GRAFT  01/24/2006   x 3,LIMA-LAD, VG-diag, VG-distal RCA  . GALLBLADDER SURGERY  1990's  . knee replacment  1990's  . LEFT HEART CATHETERIZATION WITH CORONARY/GRAFT ANGIOGRAM N/A 05/17/2014   Procedure: LEFT HEART CATHETERIZATION WITH Beatrix Fetters;  Surgeon: Jettie Booze, MD;  [facility] C[facility] LAB;  Service: Cardiovascular;  Laterality: N/A;  . lung repair     s/p lap chole     OB History   No obstetric history on file.      Home Medications    Prior to Admission medications   Medication Sig Start Date End Date Taking? Authorizing Provider  acetaminophen (TYLENOL) 650 MG CR tablet Take 1,300 mg by mouth daily as needed for pain.    [provider]  ALPRAZolam Duanne Moron) 1 MG tablet Take 1 mg by mouth 2 (two) times daily as needed for anxiety.     [provider]  apixaban (ELIQUIS) 5 MG TABS tablet Take 1 tablet (5 mg total) by mouth 2 (two) times daily. 05/16/19   Pixie Casino, MD  aspirin EC 81 MG tablet Take 81 mg by mouth daily.    [provider]  atorvastatin (LIPITOR) 40 MG tablet Take 1 tablet by mouth daily. 10/16/14   [provider]  bisacodyl (DULCOLAX) 5 MG EC tablet Take 5 mg by mouth daily as needed for moderate constipation.    [provider]  celecoxib (CELEBREX) 200 MG capsule Take 1 capsule by mouth daily. 10/27/17   [provider]  chlorthalidone (HYGROTON) 25 MG tablet Take 12.5 mg by mouth daily.    [provider]  diltiazem (CARDIZEM SR) 120 MG 12 hr capsule Take 1 capsule (120 mg total) by mouth 2 (two) times daily. 08/21/13   Hilty, Nadean Corwin, MD  diphenhydramine-acetaminophen (TYLENOL PM) 25-500 MG TABS Take 1 tablet by mouth at bedtime as needed (for sleep).    [provider]  EPINEPHrine (EPI-PEN) 0.3 mg/0.3 mL SOAJ injection Inject 0.3 mg into the muscle once as needed (for peanut allergy).      [provider]  furosemide (LASIX) 40 MG tablet Take 40 mg by mouth daily.    [provider]  gabapentin (NEURONTIN) 300 MG capsule Take 600 mg by mouth 3 (three) times daily.  03/08/13   [provider]  irbesartan (AVAPRO) 300 MG tablet Take 1 tablet (300 mg total) by mouth daily. 04/19/14   Alvstad, [name] L, RPH-CPP  Loratadine 10 MG CAPS Take 1 tablet by mouth daily. 03/21/14   [provider]  metFORMIN (GLUCOPHAGE-XR) 750 MG 24 hr tablet Take 1 tablet (750 mg total) by mouth daily. 05/19/14   Jettie Booze, MD  metoprolol (LOPRESSOR) 50 MG tablet Take 50 mg by mouth 3 (three) times daily. 03/08/13   [provider]  Multiple Vitamin (MULTIVITAMIN WITH MINERALS) TABS Take 1 tablet by mouth daily.    [provider]  nitroGLYCERIN (NITROSTAT) 0.4 MG SL tablet Place 1 tablet (0.4 mg total) under the tongue every 5 (five) minutes as needed for chest pain. 11/07/17   Almyra Deforest, PA  omega-3 acid ethyl esters (LOVAZA) 1 g capsule Take 2 capsules (2 g total) by mouth 2 (two) times daily. 05/17/19   Hilty, Nadean Corwin, MD  Omega-3 Fatty Acids (FISH OIL PO) Take 1 capsule by mouth 3 (three) times daily.    [provider]  pantoprazole (PROTONIX) 40 MG tablet Take 1 tablet by mouth daily. 09/27/13   [provider]  tiZANidine (ZANAFLEX) 4 MG tablet Take 2-4 mg by mouth daily as needed for muscle spasms.  03/08/13   [provider]  traMADol (ULTRAM) 50 MG tablet Take 150 mg by mouth at bedtime. Take as directed 04/13/14   [provider]  traZODone (DESYREL) 100 MG tablet Take 100 mg by mouth at bedtime as needed. 10/16/14   [provider]  venlafaxine XR (EFFEXOR-XR) 75 MG 24 hr capsule Take 1 capsule by mouth daily. 09/27/13   [provider]    Family History Family History  Problem Relation Age of Onset  . Heart disease Mother 70       heart attack  . Diabetes Mother   . Diabetes Father 27        heart attack  . Heart disease Brother 16       heart Attack  . Diabetes Brother     Social History Social History   Tobacco Use  . Smoking status: Never Smoker  . Smokeless tobacco: Never Used  Substance Use Topics  . Alcohol use: No  . Drug use: No     Allergies   Other, Peanut oil, and Penicillins   Review of Systems Review of Systems  Constitutional: Negative for fever.  Gastrointestinal: Positive for hematochezia. Negative for abdominal pain, hematemesis and vomiting.  Neurological: Negative for dizziness, loss of consciousness and light-headedness.  All other systems reviewed and  are negative.    Physical Exam Updated Vital Signs BP (!) 168/65   Pulse 76   Temp 99.1 F (37.3 C) (Oral)   Resp 18   Ht 0.61 m (2')   Wt 90.3 kg   SpO2 95%   BMI 242.90 kg/m   Physical Exam CONSTITUTIONAL: Well developed/well nourished HEAD: Normocephalic/atraumatic EYES: EOMI/PERRL, conjunctive are pink  ENMT: Mucous membranes moist NECK: supple no meningeal signs SPINE/BACK:entire spine nontender CV: S1/S2 noted, murmur noted LUNGS: Lungs are clear to auscultation bilaterally, no apparent distress ABDOMEN: soft, nontender, no rebound or guarding, bowel sounds noted throughout abdomen GU:no cva tenderness Rectal-no melena, no blood, minimal stool noted, Hemoccult negative NEURO: Pt is awake/alert/appropriate, moves all extremitiesx4.  No facial droop.   EXTREMITIES: pulses normal/equal, full ROM SKIN: warm, color normal PSYCH: no abnormalities of mood noted, alert and oriented to situation   ED Treatments / Results  Labs (all labs ordered are listed, but only abnormal results are displayed) Labs Reviewed  CBC WITH DIFFERENTIAL/PLATELET - Abnormal; Notable for the following components:      Result Value   MCV 100.3 (*)    All other components within normal limits  COMPREHENSIVE METABOLIC PANEL - Abnormal; Notable for the following components:   Glucose, Bld  148 (*)    All other components within normal limits  CBC - Abnormal; Notable for the following components:   RBC 3.86 (*)    MCV 100.3 (*)    All other components within normal limits  POC OCCULT BLOOD, ED  TYPE AND SCREEN  ABO/RH    EKG None  Radiology No results found.  Procedures Procedures   Medications Ordered in ED Medications - No data to display   Initial Impression / Assessment and Plan / ED Course  I have reviewed the triage vital signs and the nursing notes.  Pertinent labs  results that were available during my care of the patient were reviewed by me and considered in my medical decision making (see chart for details).        3:54 AM Patient presents with black stool concern with rectal bleeding after starting Eliquis.  She is on Eliquis for the next 3 to 4 weeks in anticipation of elective cardioversion for atrial ablation Patient has previous history of ulcers that required blood transfusion. She is high risk for acute GI bleed, but at this point Hemoccult negative.  Hemoglobin is stable.  Patient is very hesitant about be admitted We agreed to recheck her hemoglobin and monitor in the ED 6:23 AM Patient monitored for several hours.  No further episodes of rectal bleeding. Hemoglobin remained stable Patient is requesting discharge. I advised her to hold her Eliquis for the next 2 days, call her cardiologist in 2 days. She will need to have a conversation about what anticoagulation is warranted. She is comfortable with this plan.  She will continue aspirin. We discussed strict ER return precautions  Final Clinical Impressions(s) / ED Diagnoses   Final diagnoses:  Rectal bleeding    ED Discharge Orders    None       Ripley Fraise, MD 05/19/19 [phone]

## 2019-05-19 NOTE — ED Notes (Signed)
Have pt call wayne when gets to a room

## 2019-05-21 ENCOUNTER — Telehealth: Payer: Self-pay | Admitting: Internal Medicine

## 2019-05-21 DIAGNOSIS — R195 Other fecal abnormalities: Secondary | ICD-10-CM

## 2019-05-21 NOTE — Telephone Encounter (Signed)
Called patient - plan stop aspirin and restart Eliquis tomorrow -monitor for dark stools (hemoccult negative) - repeat CBC as previously discussed. If dark stools return or she has bleeding, will have to stop Eliquis and refer to her GI in [place],  Dr Lemmie Evens

## 2019-05-21 NOTE — Telephone Encounter (Signed)
Per verbal from MD, cardioversion cancelled. Patient aware.

## 2019-05-21 NOTE — Telephone Encounter (Signed)
° °  Patient calling to report she has been off Eliquis since ED visit 8/28 When should she resume taking? Please call

## 2019-05-21 NOTE — Telephone Encounter (Signed)
Patient reports she has hemorrhoids. She is not wanting to see a GI doctor. She does not know her way around [place], she does not have the money to pay for doctor's visits. She reports she does not have the funds to barely pay for her medications. Advised to HOLD eliquis as directed and will follow up with her after notifying MD  CBC & referral have already been ordered

## 2019-05-21 NOTE — Telephone Encounter (Signed)
Pt was in the ED Friday 05/18/2019 and Eliquis stopped pt asking when or if to resume.  I see Dr Debara Pickett and his nurse are currently addressing and pt will be followed up on.

## 2019-05-21 NOTE — Telephone Encounter (Signed)
Med listed updated.  Referral order cancelled

## 2019-05-21 NOTE — Telephone Encounter (Signed)
Julie Casino, MD  Ripley Fraise, MD; Fidel Levy, RN        Yes .. we'll follow-up on stools and probably a repeat CBC in 1 week.   -[name]   Previous Messages  ----- Message -----  From: Ripley Fraise, MD  Sent: 05/19/2019  6:21 AM EDT  To: Julie Casino, MD  Subject: patient followup                 Hello Dr. Debara Pickett,   This patient just started Eliquis for atrial fibrillation in anticipation of cardioversion in 4 weeks  She had 3 episodes of black stool at home. However in the ER Hemoccult negative, no signs of anemia patient requested discharge. I did inform her to hold her Eliquis for 2 days until she is able to talk to you on Monday.  Could you please follow-up with this patient?   Thanks,  [facility], Nadean Corwin, MD  Fidel Levy, RN        Not sure if I forwarded this - [name] had what seems like GI bleeding - just started on anticoagulation with plan for cardioversion. Was told to stop anticoagulation. Please refer to [facility] (GI) - she had a history of bleeding ulcers in the past. ?safe to continue anticoagulation? Will likely have to put cardioversion on hold.   Dr Lemmie Evens

## 2019-05-24 DIAGNOSIS — L97422 Non-pressure chronic ulcer of left heel and midfoot with fat layer exposed: Secondary | ICD-10-CM | POA: Diagnosis not present

## 2019-05-24 DIAGNOSIS — E11621 Type 2 diabetes mellitus with foot ulcer: Secondary | ICD-10-CM | POA: Diagnosis not present

## 2019-05-24 DIAGNOSIS — I10 Essential (primary) hypertension: Secondary | ICD-10-CM | POA: Diagnosis not present

## 2019-05-25 DIAGNOSIS — I2510 Atherosclerotic heart disease of native coronary artery without angina pectoris: Secondary | ICD-10-CM | POA: Diagnosis not present

## 2019-05-25 DIAGNOSIS — Z01812 Encounter for preprocedural laboratory examination: Secondary | ICD-10-CM | POA: Diagnosis not present

## 2019-05-25 DIAGNOSIS — I350 Nonrheumatic aortic (valve) stenosis: Secondary | ICD-10-CM | POA: Diagnosis not present

## 2019-05-26 LAB — BASIC METABOLIC PANEL
BUN/Creatinine Ratio: 16 (ref 12–28)
BUN: 14 mg/dL (ref 8–27)
CO2: 27 mmol/L (ref 20–29)
Calcium: 9.9 mg/dL (ref 8.7–10.3)
Chloride: 100 mmol/L (ref 96–106)
Creatinine, Ser: 0.87 mg/dL (ref 0.57–1.00)
GFR calc Af Amer: 77 mL/min/{1.73_m2} (ref >59)
GFR calc non Af Amer: 67 mL/min/{1.73_m2} (ref >59)
Glucose: 105 mg/dL — ABNORMAL HIGH (ref 65–99)
Potassium: 4.5 mmol/L (ref 3.5–5.2)
Sodium: 142 mmol/L (ref 134–144)

## 2019-05-26 LAB — CBC
Hematocrit: 39.7 % (ref 34.0–46.6)
Hemoglobin: 12.9 g/dL (ref 11.1–15.9)
MCH: 31.2 pg (ref 26.6–33.0)
MCHC: 32.5 g/dL (ref 31.5–35.7)
MCV: 96 fL (ref 79–97)
Platelets: 259 10*3/uL (ref 150–450)
RBC: 4.13 x10E6/uL (ref 3.77–5.28)
RDW: 12.9 % (ref 11.7–15.4)
WBC: 7.6 10*3/uL (ref 3.4–10.8)

## 2019-05-29 DIAGNOSIS — L97422 Non-pressure chronic ulcer of left heel and midfoot with fat layer exposed: Secondary | ICD-10-CM | POA: Diagnosis not present

## 2019-05-29 DIAGNOSIS — E11621 Type 2 diabetes mellitus with foot ulcer: Secondary | ICD-10-CM | POA: Diagnosis not present

## 2019-05-29 DIAGNOSIS — I10 Essential (primary) hypertension: Secondary | ICD-10-CM | POA: Diagnosis not present

## 2019-05-29 DIAGNOSIS — Z6835 Body mass index (BMI) 35.0-35.9, adult: Secondary | ICD-10-CM | POA: Diagnosis not present

## 2019-05-30 DIAGNOSIS — E11621 Type 2 diabetes mellitus with foot ulcer: Secondary | ICD-10-CM | POA: Diagnosis not present

## 2019-06-04 DIAGNOSIS — L97422 Non-pressure chronic ulcer of left heel and midfoot with fat layer exposed: Secondary | ICD-10-CM | POA: Diagnosis not present

## 2019-06-04 DIAGNOSIS — E11621 Type 2 diabetes mellitus with foot ulcer: Secondary | ICD-10-CM | POA: Diagnosis not present

## 2019-06-11 DIAGNOSIS — L97422 Non-pressure chronic ulcer of left heel and midfoot with fat layer exposed: Secondary | ICD-10-CM | POA: Diagnosis not present

## 2019-06-11 DIAGNOSIS — E11621 Type 2 diabetes mellitus with foot ulcer: Secondary | ICD-10-CM | POA: Diagnosis not present

## 2019-06-16 ENCOUNTER — Other Ambulatory Visit (HOSPITAL_COMMUNITY): Payer: Medicare HMO

## 2019-06-18 DIAGNOSIS — Z8631 Personal history of diabetic foot ulcer: Secondary | ICD-10-CM | POA: Diagnosis not present

## 2019-06-18 DIAGNOSIS — Z09 Encounter for follow-up examination after completed treatment for conditions other than malignant neoplasm: Secondary | ICD-10-CM | POA: Diagnosis not present

## 2019-06-18 DIAGNOSIS — E11621 Type 2 diabetes mellitus with foot ulcer: Secondary | ICD-10-CM | POA: Diagnosis not present

## 2019-06-18 DIAGNOSIS — L97429 Non-pressure chronic ulcer of left heel and midfoot with unspecified severity: Secondary | ICD-10-CM | POA: Diagnosis not present

## 2019-06-20 ENCOUNTER — Encounter (HOSPITAL_COMMUNITY): Payer: Self-pay

## 2019-06-20 ENCOUNTER — Ambulatory Visit (HOSPITAL_COMMUNITY): Admit: 2019-06-20 | Payer: Medicare HMO | Admitting: Internal Medicine

## 2019-06-20 SURGERY — CARDIOVERSION
Anesthesia: General

## 2019-07-09 ENCOUNTER — Encounter: Payer: Self-pay | Admitting: Internal Medicine

## 2019-07-09 ENCOUNTER — Ambulatory Visit (INDEPENDENT_AMBULATORY_CARE_PROVIDER_SITE_OTHER): Payer: Medicare HMO | Admitting: Internal Medicine

## 2019-07-09 ENCOUNTER — Other Ambulatory Visit: Payer: Self-pay

## 2019-07-09 VITALS — BP 157/72 | HR 80 | Temp 97.3°F | Ht 62.0 in | Wt 201.8 lb

## 2019-07-09 DIAGNOSIS — I4891 Unspecified atrial fibrillation: Secondary | ICD-10-CM

## 2019-07-09 DIAGNOSIS — I480 Paroxysmal atrial fibrillation: Secondary | ICD-10-CM | POA: Diagnosis not present

## 2019-07-09 DIAGNOSIS — E785 Hyperlipidemia, unspecified: Secondary | ICD-10-CM

## 2019-07-09 MED ORDER — FENOFIBRATE 145 MG PO TABS: 145.0000 mg | ORAL_TABLET | Freq: Every day | ORAL | 3 refills | Status: AC

## 2019-07-09 NOTE — Progress Notes (Signed)
OFFICE NOTE  Chief Complaint:  Follow-up of A. fib, rectal bleeding  Primary Care Physician: Mateo Flow, MD  HPI:  Julie Leblanc is a 73 year old female previously followed by Dr. Rex Kras with a history of CABG in 2007 by Dr. Prescott Gum. This included a LIMA to the LAD, a vein graft to the diagonal and vein graft to the distal RCA. In 2010 she had a nuclear stress test that showed an EF of 70%. No ischemia. She has diabetes and dyslipidemia but good control of her diabetes on oral medications. Recently she had a knee replacement, and she is contemplating a future knee replacement as well. She is totally asymptomatic from a cardiac standpoint, denies any chest pain, worsening shortness of breath, palpitations, presyncope or syncopal symptoms.  This past summer she saw Cecilie Kicks, FNP, for dizziness and was noted to be having dark stools. Subsequently she was found to have a low hemoglobin and was referred to [facility]. She underwent EGD and colonoscopy and was found to have multiple gastric ulcers which have started to heal based on her report of a recent EGD one month ago. In addition she had a recent fall and landed on her left knee and developed a hematoma. Just has a resolving hematoma on her left cheek. She is still undergoing packing and had incision and drainage of the left knee and is bothered by warmth and swelling of the left lower extremity which was possibly concerning for cellulitis. Other changes recently included switching her diltiazem to the 120 mg SR twice daily, due to insurance changes  [name] returns today for followup. She has responded having problems with uncontrolled hypertension as well as symptoms of stress and anxiety. In addition she's had some chest discomfort which is improved with aspirin. She's been having problems with her left knee as outlined above and then recently was told she needs left knee replacement. The surgery is coming up. I'm  concerned about the fact that she is having some discomfort in her chest and now it appears that she is having. Poorly controlled hypertension. Blood pressures are in the A999333 to A999333 systolic. Recent lab work demonstrated normal renal function.  At her last visit we recommended a stress test preoperatively as she was having some chest discomfort as well. She's also had recently voided controlled hypertension. I started her on irbesartan 150 mg and and followup with our hypertension pharmacist her dose was increased to 300 mg daily. Blood pressure still remained in the 170-180 range. She is complaining of significant left knee pain. She continues to have some mild chest discomfort. Her stress test indicated reversible inferior ischemia, albeit a small area, however this could be implicated in her chest pain symptoms and recent difficult to control hypertension.  I saw Mrs. Mah back today. She seems to be doing fairly well other than complaining of ankle swelling. This is due to a combination of significant neuropathy as well as the fact that she's had veins removed from her legs for bypass. She denies any chest pain or worsening shortness of breath. Cholesterol is being followed by her primary care provider.  She has not required any additional short acting nitroglycerin.  02/09/2018  [name] returns today for follow-up of her echo.  She had normal systolic function and very mild aortic stenosis.  This is not significantly changed compared to her prior study.  She denies any chest pain.  Currently she has a pneumonia and is on antibiotics.  She is also been having diarrhea.  At times she gets dizzy or feels lightheaded, and I advised her to not take her Lasix while she was having loose stools.  History is normal if not mildly elevated today.  EKG shows no ischemia.  She had been dealing with chronic lower extremity swelling and erythema, but does not have evidence of cellulitis.   05/16/2019  Mrs.  Salvato is seen today for follow-up.  Routine EKG was performed which shows atrial fibrillation at 71.  She is unaware of this denies palpitations or worsening fatigue.  It may be that she is rate controlled due to the fact she is on diltiazem and metoprolol.  She is at increased risk of stroke in fact her CHADVASC score is 4.  She is currently on low-dose aspirin.  She says she is been having problems with 1 of her eyes and was found to have a mass behind it.  Her ophthalmologist is possibly contemplating removing it.  She also saw her PCP recently and had some blood work.  Her total cholesterol was 199, triglycerides 303, LDL 93 and HDL 45.  Her goal LDL is less than 90 and she already is on high potency atorvastatin.  07/09/2019  [name] returns today for follow-up.  Recently I diagnosed her with atrial fibrillation and started her on anticoagulation.  Subsequently she called the office and ultimately presented to the emergency department for dark stools.  Source of bleeding was not determined, however she was asked to stop her anticoagulation.  She declined a GI evaluation.  She says she has had numerous GI procedures in the past.  Ultimately the bleeding subsided however this interrupted plans for elective cardioversion.  Fortunately today her EKG shows a sinus rhythm with PACs.  PMHx:  Past Medical History:  Diagnosis Date   Aortic stenosis, mild 10/2012   mild AS by Echo , mild MR   CAD (coronary artery disease)    hx CABG 2007, LAST NUC EF 70%-no ischemia   Diabetes mellitus ([facility])    GERD (gastroesophageal reflux disease)    Hyperlipemia    Hypertension    Neuropathy     Past Surgical History:  Procedure Laterality Date   ABDOMINAL HYSTERECTOMY  1990   CORONARY ARTERY BYPASS GRAFT  01/24/2006   x 3,LIMA-LAD, VG-diag, VG-distal RCA   GALLBLADDER SURGERY  1990's   knee replacment  1990's   LEFT HEART CATHETERIZATION WITH CORONARY/GRAFT ANGIOGRAM N/A 05/17/2014    Procedure: LEFT HEART CATHETERIZATION WITH Beatrix Fetters;  Surgeon: Jettie Booze, MD;  Location: [facility] CATH LAB;  Service: Cardiovascular;  Laterality: N/A;   lung repair     s/p lap chole    FAMHx:  Family History  Problem Relation Age of Onset   Heart disease Mother 23       heart attack   Diabetes Mother    Diabetes Father 25       heart attack   Heart disease Brother 60       heart Attack   Diabetes Brother     SOCHx:   reports that she has never smoked. She has never used smokeless tobacco. She reports that she does not drink alcohol or use drugs.  ALLERGIES:  Allergies  Allergen Reactions   Other Anaphylaxis and Swelling    Nuts    Peanut Oil Shortness Of Breath   Penicillins     ROS: Pertinent items noted in HPI and remainder of comprehensive ROS otherwise negative.  HOME  MEDS: Current Outpatient Medications  Medication Sig Dispense Refill   acetaminophen (TYLENOL) 650 MG CR tablet Take 1,300 mg by mouth daily as needed for pain.     ALPRAZolam (XANAX) 1 MG tablet Take 1 mg by mouth 2 (two) times daily as needed for anxiety.      apixaban (ELIQUIS) 5 MG TABS tablet Take 5 mg by mouth 2 (two) times daily.     atorvastatin (LIPITOR) 40 MG tablet Take 1 tablet by mouth daily.     bisacodyl (DULCOLAX) 5 MG EC tablet Take 5 mg by mouth daily as needed for moderate constipation.     celecoxib (CELEBREX) 200 MG capsule Take 1 capsule by mouth daily.     chlorthalidone (HYGROTON) 25 MG tablet Take 12.5 mg by mouth daily.     diltiazem (CARDIZEM SR) 120 MG 12 hr capsule Take 1 capsule (120 mg total) by mouth 2 (two) times daily. 60 capsule 11   diphenhydramine-acetaminophen (TYLENOL PM) 25-500 MG TABS Take 1 tablet by mouth at bedtime as needed (for sleep).     EPINEPHrine (EPI-PEN) 0.3 mg/0.3 mL SOAJ injection Inject 0.3 mg into the muscle once as needed (for peanut allergy).      furosemide (LASIX) 40 MG tablet Take 40 mg by mouth  daily.     gabapentin (NEURONTIN) 300 MG capsule Take 600 mg by mouth 3 (three) times daily.      irbesartan (AVAPRO) 300 MG tablet Take 1 tablet (300 mg total) by mouth daily. 30 tablet 5   Loratadine 10 MG CAPS Take 1 tablet by mouth daily.     metFORMIN (GLUCOPHAGE-XR) 750 MG 24 hr tablet Take 1 tablet (750 mg total) by mouth daily.     metoprolol (LOPRESSOR) 50 MG tablet Take 50 mg by mouth 3 (three) times daily.     Multiple Vitamin (MULTIVITAMIN WITH MINERALS) TABS Take 1 tablet by mouth daily.     nitroGLYCERIN (NITROSTAT) 0.4 MG SL tablet Place 1 tablet (0.4 mg total) under the tongue every 5 (five) minutes as needed for chest pain. 25 tablet 3   omega-3 acid ethyl esters (LOVAZA) 1 g capsule Take 2 capsules (2 g total) by mouth 2 (two) times daily. 360 capsule 1   tiZANidine (ZANAFLEX) 4 MG tablet Take 2-4 mg by mouth daily as needed for muscle spasms.      traMADol (ULTRAM) 50 MG tablet Take 150 mg by mouth at bedtime. Take as directed     traZODone (DESYREL) 100 MG tablet Take 100 mg by mouth at bedtime as needed.     venlafaxine XR (EFFEXOR-XR) 75 MG 24 hr capsule Take 1 capsule by mouth daily.     Omega-3 Fatty Acids (FISH OIL PO) Take 1 capsule by mouth 3 (three) times daily. Patient needs something generic     pantoprazole (PROTONIX) 40 MG tablet Take 1 tablet by mouth daily. Patient needs something generic     No current facility-administered medications for this visit.     LABS/IMAGING: No results found for this or any previous visit (from the past 48 hour(s)). No results found.  VITALS: BP (!) 157/72    Pulse 80    Temp (!) 97.3 F (36.3 C)    Ht 5\' 2"  (1.575 m)    Wt 201 lb 12.8 oz (91.5 kg)    SpO2 95%    BMI 36.91 kg/m   EXAM: General appearance: alert, appears older than stated age and mild distress Neck: no carotid bruit, no JVD  and thyroid not enlarged, symmetric, no tenderness/mass/nodules Lungs: diminished breath sounds bilaterally and rhonchi  RLL and RML Heart: regular rate and rhythm, S1, S2 normal and systolic murmur: early systolic 2/6, crescendo at 2nd right intercostal space Abdomen: soft, non-tender; bowel sounds normal; no masses,  no organomegaly Extremities: extremities normal, atraumatic, no cyanosis or edema Pulses: 2+ and symmetric Skin: Skin color, texture, turgor normal. No rashes or lesions Neurologic: Grossly normal Psych: Pleasant   EKG: Sinus rhythm with PACs at 76-personally reviewed  ASSESSMENT: 1. Paroxysmal atrial fibrillation-CHADSVASC score of 4 2. Coronary artery disease status post three-vessel CABG in 2007  3. Diabetes type 2 4. Dyslipidemia 5. Mild aortic stenosis 6. HTN  PLAN: 1.   Mrs. Meyerson has likely paroxysmal atrial fibrillation seems to have converted back to sinus rhythm in the interim while awaiting for cardioversion.  She also has some mild aortic stenosis which will need to continue to follow.  Blood pressure was elevated today.  She is advised to monitor that however medications were not adjusted today.  Follow-up with me in 6 months  Pixie Casino, MD, [facility], [place] Director of the [facility] Lipid Disorders &  Cardiovascular Risk Reduction Clinic Diplomate of the American Board of Clinical Lipidology Attending Cardiologist  Direct Dial: [phone]   Fax: [phone]  Website:  [email or web address] 07/09/2019, 2:40 PM

## 2019-07-09 NOTE — Patient Instructions (Signed)
Medication Instructions:  START fenofibrate 145mg  once daily Continue other current medications  *If you need a refill on your cardiac medications before your next appointment, please call your pharmacy*  Lab Work: FASTING lab work mid-January to check cholesterol If you have labs (blood work) drawn today and your tests are completely normal, you will receive your results only by: Marland Kitchen MyChart Message (if you have MyChart) OR . A paper copy in the mail If you have any lab test that is abnormal or we need to change your treatment, we will call you to review the results.  Testing/Procedures: NONE  Follow-Up: At [facility], you and your health needs are our priority.  As part of our continuing mission to provide you with exceptional heart care, we have created designated Provider Care Teams.  These Care Teams include your primary Cardiologist (physician) and Advanced Practice Providers (APPs -  Physician Assistants and Nurse Practitioners) who all work together to provide you with the care you need, when you need it.  Your next appointment:   12 months  The format for your next appointment:   In Person  Provider:   You may see Dr. Debara Pickett or one of the following Advanced Practice Providers on your designated Care Team:    Almyra Deforest, PA-C  Fabian Sharp, [place] or   Roby Lofts, [place]   Other Instructions

## 2020-03-04 DIAGNOSIS — M949 Disorder of cartilage, unspecified: Secondary | ICD-10-CM | POA: Diagnosis not present

## 2020-03-04 DIAGNOSIS — E782 Mixed hyperlipidemia: Secondary | ICD-10-CM | POA: Diagnosis not present

## 2020-03-04 DIAGNOSIS — M159 Polyosteoarthritis, unspecified: Secondary | ICD-10-CM | POA: Diagnosis not present

## 2020-03-04 DIAGNOSIS — E1169 Type 2 diabetes mellitus with other specified complication: Secondary | ICD-10-CM | POA: Diagnosis not present

## 2020-03-04 DIAGNOSIS — Z79899 Other long term (current) drug therapy: Secondary | ICD-10-CM | POA: Diagnosis not present

## 2020-03-04 DIAGNOSIS — E785 Hyperlipidemia, unspecified: Secondary | ICD-10-CM | POA: Diagnosis not present

## 2020-03-19 DIAGNOSIS — I10 Essential (primary) hypertension: Secondary | ICD-10-CM | POA: Diagnosis not present

## 2020-03-19 DIAGNOSIS — E782 Mixed hyperlipidemia: Secondary | ICD-10-CM | POA: Diagnosis not present

## 2020-04-15 ENCOUNTER — Telehealth: Payer: Self-pay | Admitting: Internal Medicine

## 2020-04-15 NOTE — Telephone Encounter (Signed)
Julie Leblanc is calling stating the last time she saw Dr. Debara Pickett she advised him she gets very bad anxiety making the trip to [facility] for appointments with him. She states due to this Dr. Debara Pickett advised her he has a friend in [place] he could get in contact with to schedule her with him so she no longer has to make the trip to [facility] for Cardiology care. Kaiesha was not sure what provider he had told her. Please advise.

## 2020-04-15 NOTE — Telephone Encounter (Signed)
Spoke with pt, she would like to be seen in [place] and does not remember who dr hilty said she wanted her see. Will forward to dr hilty to review and advise.

## 2020-04-16 NOTE — Telephone Encounter (Signed)
Any of the [facility] providers would be fine - Dr. Harriet Masson would be probably the easiest to schedule with, especially if she wants to see a female.  Dr Lemmie Evens

## 2020-04-16 NOTE — Telephone Encounter (Signed)
Patient aware MD is OK with change in provider.  Scheduled her for new patient visit with Dr. Harriet Masson.   Routed as [employer]

## 2020-04-19 DIAGNOSIS — E782 Mixed hyperlipidemia: Secondary | ICD-10-CM | POA: Diagnosis not present

## 2020-04-19 DIAGNOSIS — I10 Essential (primary) hypertension: Secondary | ICD-10-CM | POA: Diagnosis not present

## 2020-06-09 DIAGNOSIS — E1165 Type 2 diabetes mellitus with hyperglycemia: Secondary | ICD-10-CM | POA: Diagnosis not present

## 2020-06-09 DIAGNOSIS — E1140 Type 2 diabetes mellitus with diabetic neuropathy, unspecified: Secondary | ICD-10-CM | POA: Diagnosis not present

## 2020-06-09 DIAGNOSIS — I480 Paroxysmal atrial fibrillation: Secondary | ICD-10-CM | POA: Diagnosis not present

## 2020-06-13 ENCOUNTER — Other Ambulatory Visit: Payer: Self-pay

## 2020-06-13 DIAGNOSIS — I10 Essential (primary) hypertension: Secondary | ICD-10-CM | POA: Insufficient documentation

## 2020-06-13 DIAGNOSIS — E119 Type 2 diabetes mellitus without complications: Secondary | ICD-10-CM | POA: Insufficient documentation

## 2020-06-13 DIAGNOSIS — E785 Hyperlipidemia, unspecified: Secondary | ICD-10-CM | POA: Insufficient documentation

## 2020-06-13 DIAGNOSIS — G629 Polyneuropathy, unspecified: Secondary | ICD-10-CM | POA: Insufficient documentation

## 2020-06-13 DIAGNOSIS — K219 Gastro-esophageal reflux disease without esophagitis: Secondary | ICD-10-CM | POA: Insufficient documentation

## 2020-06-16 ENCOUNTER — Other Ambulatory Visit: Payer: Self-pay

## 2020-06-16 ENCOUNTER — Ambulatory Visit: Payer: Medicare HMO | Admitting: Cardiology

## 2020-06-16 ENCOUNTER — Encounter: Payer: Self-pay | Admitting: Cardiology

## 2020-06-16 VITALS — BP 140/68 | HR 80 | Ht 62.0 in | Wt 199.6 lb

## 2020-06-16 DIAGNOSIS — L97509 Non-pressure chronic ulcer of other part of unspecified foot with unspecified severity: Secondary | ICD-10-CM | POA: Diagnosis not present

## 2020-06-16 DIAGNOSIS — I2583 Coronary atherosclerosis due to lipid rich plaque: Secondary | ICD-10-CM | POA: Diagnosis not present

## 2020-06-16 DIAGNOSIS — I350 Nonrheumatic aortic (valve) stenosis: Secondary | ICD-10-CM

## 2020-06-16 DIAGNOSIS — R0602 Shortness of breath: Secondary | ICD-10-CM

## 2020-06-16 DIAGNOSIS — I10 Essential (primary) hypertension: Secondary | ICD-10-CM | POA: Diagnosis not present

## 2020-06-16 DIAGNOSIS — I25810 Atherosclerosis of coronary artery bypass graft(s) without angina pectoris: Secondary | ICD-10-CM | POA: Diagnosis not present

## 2020-06-16 DIAGNOSIS — E1100 Type 2 diabetes mellitus with hyperosmolarity without nonketotic hyperglycemic-hyperosmolar coma (NKHHC): Secondary | ICD-10-CM

## 2020-06-16 DIAGNOSIS — E13621 Other specified diabetes mellitus with foot ulcer: Secondary | ICD-10-CM

## 2020-06-16 DIAGNOSIS — I2510 Atherosclerotic heart disease of native coronary artery without angina pectoris: Secondary | ICD-10-CM | POA: Diagnosis not present

## 2020-06-16 NOTE — Patient Instructions (Signed)
Medication Instructions:  No medication changes. *If you need a refill on your cardiac medications before your next appointment, please call your pharmacy*   Lab Work: None ordered If you have labs (blood work) drawn today and your tests are completely normal, you will receive your results only by: Marland Kitchen MyChart Message (if you have MyChart) OR . A paper copy in the mail If you have any lab test that is abnormal or we need to change your treatment, we will call you to review the results.   Testing/Procedures: Your physician has requested that you have an echocardiogram. Echocardiography is a painless test that uses sound waves to create images of your heart. It provides your doctor with information about the size and shape of your heart and how well your heart's chambers and valves are working. This procedure takes approximately one hour. There are no restrictions for this procedure.     Follow-Up: At [facility], you and your health needs are our priority.  As part of our continuing mission to provide you with exceptional heart care, we have created designated Provider Care Teams.  These Care Teams include your primary Cardiologist (physician) and Advanced Practice Providers (APPs -  Physician Assistants and Nurse Practitioners) who all work together to provide you with the care you need, when you need it.  We recommend signing up for the patient portal called "MyChart".  Sign up information is provided on this After Visit Summary.  MyChart is used to connect with patients for Virtual Visits (Telemedicine).  Patients are able to view lab/test results, encounter notes, upcoming appointments, etc.  Non-urgent messages can be sent to your provider as well.   To learn more about what you can do with MyChart, go to [email or web address].    Your next appointment:   6 week(s)  The format for your next appointment:   In Person  Provider:   Berniece Salines, DO   Other  Instructions  Echocardiogram An echocardiogram is a procedure that uses painless sound waves (ultrasound) to produce an image of the heart. Images from an echocardiogram can provide important information about:  Signs of coronary artery disease (CAD).  Aneurysm detection. An aneurysm is a weak or damaged part of an artery wall that bulges out from the normal force of blood pumping through the body.  Heart size and shape. Changes in the size or shape of the heart can be associated with certain conditions, including heart failure, aneurysm, and CAD.  Heart muscle function.  Heart valve function.  Signs of a past heart attack.  Fluid buildup around the heart.  Thickening of the heart muscle.  A tumor or infectious growth around the heart valves. Tell a health care provider about:  Any allergies you have.  All medicines you are taking, including vitamins, herbs, eye drops, creams, and over-the-counter medicines.  Any blood disorders you have.  Any surgeries you have had.  Any medical conditions you have.  Whether you are pregnant or may be pregnant. What are the risks? Generally, this is a safe procedure. However, problems may occur, including:  Allergic reaction to dye (contrast) that may be used during the procedure. What happens before the procedure? No specific preparation is needed. You may eat and drink normally. What happens during the procedure?   An IV tube may be inserted into one of your veins.  You may receive contrast through this tube. A contrast is an injection that improves the quality of the pictures from your heart.  A  gel will be applied to your chest.  A wand-like tool (transducer) will be moved over your chest. The gel will help to transmit the sound waves from the transducer.  The sound waves will harmlessly bounce off of your heart to allow the heart images to be captured in real-time motion. The images will be recorded on a computer. The  procedure may vary among health care providers and hospitals. What happens after the procedure?  You may return to your normal, everyday life, including diet, activities, and medicines, unless your health care provider tells you not to do that. Summary  An echocardiogram is a procedure that uses painless sound waves (ultrasound) to produce an image of the heart.  Images from an echocardiogram can provide important information about the size and shape of your heart, heart muscle function, heart valve function, and fluid buildup around your heart.  You do not need to do anything to prepare before this procedure. You may eat and drink normally.  After the echocardiogram is completed, you may return to your normal, everyday life, unless your health care provider tells you not to do that. This information is not intended to replace advice given to you by your health care provider. Make sure you discuss any questions you have with your health care provider. Document Revised: 12/28/2018 Document Reviewed: 10/09/2016 [employer]vier Patient Education  2020 [employer]   

## 2020-06-16 NOTE — Progress Notes (Signed)
Cardiology Office Note:    Date:  06/16/2020   ID:  Julie Leblanc, DOB 1946/03/07, MRN [id number]  PCP:  Mateo Flow, MD  Cardiologist:  Berniece Salines, DO  Electrophysiologist:  None   Referring MD: Mateo Flow, MD   " I have been having some chest pain"  History of Present Illness:    Julie Leblanc is a 74 y.o. female with a hx of coronary artery disease, post CABG in 2007, mild aortic stenosis, diabetes mellitus, dyslipidemia, hypertension presents today for follow-up visit.  The patient did see Dr. Lyman Bishop in October 2020 and discussed with him that she would need to get a cardiologist closer to home and therefore was referred to me.  She tells me today that she has been experiencing intermittent chest pain.  She notes that it is a rare occurrence but does happen.  She described as a midsternal and sometimes under her breasts short lasting pain which she tells me is about less than 50 and 20 seconds.  She also does have shortness of breath on exertion.  Compared to both symptoms she tells me her shortness of breath is her most bothersome symptom.  No other complaints at this time.  Past Medical History:  Diagnosis Date  . Abnormal nuclear stress test 05/10/2014  . Aortic stenosis, mild 10/2012   mild AS by Echo , mild MR  . Aortic valve stenosis 10/05/2013  . CAD (coronary artery disease)    hx CABG 2007, LAST NUC EF 70%-no ischemia  . Cellulitis of both lower extremities 06/17/2014  . Chest pain 05/10/2014  . Coronary artery disease involving coronary bypass graft of native heart without angina pectoris 02/09/2018  . Dark stools 03/27/2013  . Diabetes mellitus ([place])   . Dizzy 03/27/2013  . DM2 (diabetes mellitus, type 2) ([place]) 10/05/2013  . Dyslipidemia 10/05/2013  . Essential hypertension 05/10/2014  . GERD (gastroesophageal reflux disease)   . Hyperlipemia   . Hypertension   . Knee joint pain 12/21/2012  . Low back pain 12/21/2012  . Neuropathy   . Obesity, Class I, BMI  30.0-34.9 (see actual BMI) 06/14/2014  . Preoperative cardiovascular examination 04/05/2014  . S/P CABG x 3 10/05/2013   2007 - Dr. Lucianne Lei Trigt LIMA to LAD, SVG to diagonal and SVG to distal RCA     Past Surgical History:  Procedure Laterality Date  . ABDOMINAL HYSTERECTOMY  1990  . CORONARY ARTERY BYPASS GRAFT  01/24/2006   x 3,LIMA-LAD, VG-diag, VG-distal RCA  . GALLBLADDER SURGERY  1990's  . knee replacment  1990's  . LEFT HEART CATHETERIZATION WITH CORONARY/GRAFT ANGIOGRAM N/A 05/17/2014   Procedure: LEFT HEART CATHETERIZATION WITH Beatrix Fetters;  Surgeon: Jettie Booze, MD;  Location: [facility] CATH LAB;  Service: Cardiovascular;  Laterality: N/A;  . lung repair     s/p lap chole    Current Medications: Current Meds  Medication Sig  . acetaminophen (TYLENOL) 650 MG CR tablet Take 1,300 mg by mouth daily as needed for pain.  Marland Kitchen ALPRAZolam (XANAX) 1 MG tablet Take 1 mg by mouth 2 (two) times daily as needed for anxiety.   Marland Kitchen apixaban (ELIQUIS) 5 MG TABS tablet Take 5 mg by mouth 2 (two) times daily.  Marland Kitchen aspirin EC 81 MG tablet Take 81 mg by mouth daily. Swallow whole.  Marland Kitchen atorvastatin (LIPITOR) 40 MG tablet Take 1 tablet by mouth daily.  . bisacodyl (DULCOLAX) 5 MG EC tablet Take 5 mg by mouth daily as needed  for moderate constipation.  . celecoxib (CELEBREX) 200 MG capsule Take 1 capsule by mouth daily.  . chlorthalidone (HYGROTON) 25 MG tablet Take 12.5 mg by mouth daily.  Marland Kitchen diltiazem (CARDIZEM SR) 120 MG 12 hr capsule Take 1 capsule (120 mg total) by mouth 2 (two) times daily.  . diphenhydramine-acetaminophen (TYLENOL PM) 25-500 MG TABS Take 1 tablet by mouth at bedtime as needed (for sleep).  . EPINEPHrine (EPI-PEN) 0.3 mg/0.3 mL SOAJ injection Inject 0.3 mg into the muscle once as needed (for peanut allergy).   . fenofibrate (TRICOR) 145 MG tablet Take 1 tablet (145 mg total) by mouth daily.  . furosemide (LASIX) 40 MG tablet Take 40 mg by mouth daily.  Marland Kitchen gabapentin  (NEURONTIN) 300 MG capsule Take 600 mg by mouth 3 (three) times daily.   Marland Kitchen icosapent Ethyl (VASCEPA) 1 g capsule Take 2 g by mouth 2 (two) times daily.  . irbesartan (AVAPRO) 300 MG tablet Take 1 tablet (300 mg total) by mouth daily.  . Loratadine 10 MG CAPS Take 1 tablet by mouth daily.  . metFORMIN (GLUCOPHAGE-XR) 750 MG 24 hr tablet Take 1,500 mg by mouth daily with breakfast.  . metoprolol (LOPRESSOR) 50 MG tablet Take 50 mg by mouth 3 (three) times daily.  . Multiple Vitamin (MULTIVITAMIN WITH MINERALS) TABS Take 1 tablet by mouth daily.  . nitroGLYCERIN (NITROSTAT) 0.4 MG SL tablet Place 1 tablet (0.4 mg total) under the tongue every 5 (five) minutes as needed for chest pain.  . pantoprazole (PROTONIX) 40 MG tablet Take 40 mg by mouth daily.   Marland Kitchen tiZANidine (ZANAFLEX) 4 MG tablet Take 2-4 mg by mouth daily as needed for muscle spasms.   . traMADol (ULTRAM) 50 MG tablet Take 150 mg by mouth at bedtime. Take as directed  . traZODone (DESYREL) 100 MG tablet Take 100 mg by mouth at bedtime as needed.  . venlafaxine XR (EFFEXOR-XR) 75 MG 24 hr capsule Take 1 capsule by mouth daily.     Allergies:   Other, Peanut oil, and Penicillins   Social History   Socioeconomic History  . Marital status: Widowed    Spouse name: Not on file  . Number of children: Not on file  . Years of education: Not on file  . Highest education level: Not on file  Occupational History  . Not on file  Tobacco Use  . Smoking status: Never Smoker  . Smokeless tobacco: Never Used  Substance and Sexual Activity  . Alcohol use: No  . Drug use: No  . Sexual activity: Not on file  Other Topics Concern  . Not on file  Social History Narrative  . Not on file   Social Determinants of Health   Financial Resource Strain:   . Difficulty of Paying Living Expenses: Not on file  Food Insecurity:   . Worried About Charity fundraiser in the Last Year: Not on file  . Ran Out of Food in the Last Year: Not on file   Transportation Needs:   . Lack of Transportation (Medical): Not on file  . Lack of Transportation (Non-Medical): Not on file  Physical Activity:   . Days of Exercise per Week: Not on file  . Minutes of Exercise per Session: Not on file  Stress:   . Feeling of Stress : Not on file  Social Connections:   . Frequency of Communication with Friends and Family: Not on file  . Frequency of Social Gatherings with Friends and Family: Not on  file  . Attends Religious Services: Not on file  . Active Member of Clubs or Organizations: Not on file  . Attends Archivist Meetings: Not on file  . Marital Status: Not on file     Family History: The patient's family history includes Diabetes in her brother and mother; Diabetes (age of onset: 20) in her father; Heart disease (age of onset: 37) in her brother; Heart disease (age of onset: 1) in her mother.  ROS:   Review of Systems  Constitution: Negative for decreased appetite, fever and weight gain.  HENT: Negative for congestion, ear discharge, hoarse voice and sore throat.   Eyes: Negative for discharge, redness, vision loss in right eye and visual halos.  Cardiovascular: Negative for chest pain, dyspnea on exertion, leg swelling, orthopnea and palpitations.  Respiratory: Negative for cough, hemoptysis, shortness of breath and snoring.   Endocrine: Negative for heat intolerance and polyphagia.  Hematologic/Lymphatic: Negative for bleeding problem. Does not bruise/bleed easily.  Skin: Negative for flushing, nail changes, rash and suspicious lesions.  Musculoskeletal: Negative for arthritis, joint pain, muscle cramps, myalgias, neck pain and stiffness.  Gastrointestinal: Negative for abdominal pain, bowel incontinence, diarrhea and excessive appetite.  Genitourinary: Negative for decreased libido, genital sores and incomplete emptying.  Neurological: Negative for brief paralysis, focal weakness, headaches and loss of balance.   Psychiatric/Behavioral: Negative for altered mental status, depression and suicidal ideas.  Allergic/Immunologic: Negative for HIV exposure and persistent infections.    EKGs/Labs/Other Studies Reviewed:    The following studies were reviewed today:   EKG:  The ekg ordered today demonstrates sinus rhythm, heart rate 80 bpm with arrhythmia to inversion in the inferior and anterolateral leads compared to prior EKG these ST changes are new.  Echocardiogram  Left ventricle: The cavity size was normal. Systolic function was normal. Wall motion was normal; there were no regional wall motion abnormalities. Features are consistent with a pseudonormal left ventricular filling pattern, with concomitant abnormal relaxation and increased filling pressure (grade 2 diastolic dysfunction). Doppler parameters are consistent with high ventricular filling pressure.  - Aortic valve: There was mild stenosis. There was no regurgitation. Peak velocity (S): 284 cm/s. Mean gradient (S): 18 mm Hg.  - Mitral valve: Moderately calcified annulus. Transvalvular velocity was within the normal range. There was no evidence for stenosis. There was trivial regurgitation.  - Left atrium: The atrium was mildly dilated.  - Right ventricle: The cavity size was normal. Wall thickness was  normal. Systolic function was normal.  - Atrial septum: A patent foramen ovale cannot be excluded.  - Tricuspid valve: There was mild regurgitation.  - Pulmonary arteries: Systolic pressure was severely increased. PA  peak pressure: 66 mm Hg (S).   Recent Labs: No results found for requested labs within last 8760 hours.  Recent Lipid Panel No results found for: CHOL, TRIG, HDL, CHOLHDL, VLDL, LDLCALC, LDLDIRECT  Physical Exam:    VS:  BP 140/68   Pulse 80   Ht 5\' 2"  (1.575 m)   Wt 199 lb 9.6 oz (90.5 kg)   SpO2 94%   BMI 36.51 kg/m     Wt Readings from Last 3 Encounters:  06/16/20 199 lb 9.6 oz (90.5 kg)   07/09/19 201 lb 12.8 oz (91.5 kg)  05/18/19 199 lb (90.3 kg)     GEN: Well nourished, well developed in no acute distress HEENT: Normal NECK: No JVD; No carotid bruits LYMPHATICS: No lymphadenopathy CARDIAC: S1S2 noted,RRR, 3/6 midsystolic ejection murmurs, rubs, gallops RESPIRATORY:  Clear to auscultation without rales, wheezing or rhonchi  ABDOMEN: Soft, non-tender, non-distended, +bowel sounds, no guarding. EXTREMITIES: Bilateral trace lower extremity edema, No cyanosis, no clubbing, left heel ulcer. MUSCULOSKELETAL:  No deformity  SKIN: Warm and dry NEUROLOGIC:  Alert and oriented x 3, non-focal PSYCHIATRIC:  Normal affect, good insight  ASSESSMENT:    1. Shortness of breath   2. Mild Aortic stenosis    3. Coronary artery disease due to lipid rich plaque   4. 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 Essential hypertension   6. Coronary artery disease involving coronary bypass graft of native heart without angina pectoris   7. Type 2 diabetes mellitus with hyperosmolarity without coma, without long-term current use of insulin (H[facility])    PLAN:     At this point with her shortness of breath I wanted to assess her valves to make sure that her aortic stenosis is not significantly worsening.  She will need an ischemic evaluation.  Awaiting the report for her echocardiogram as if her valve is significant we will proceed directly to left heart catheterization compared to a stress test.  In the meantime she has nitroglycerin of asked the patient to take nitroglycerin if needed  Continue with aspirin and statin.  Blood pressure is acceptable in the office.  No changes will be made today.  Diabetes mellitus-continue medication per PCP.  There is also evidence of foot ulcer on her left heel will refer patient to podiatrist as she has not been following with a  podiatrist.  The patient is in agreement with the above plan. The patient left the office in stable condition.  The patient will follow up in 6 weeks or sooner if needed.  Medication Adjustments/Labs and Tests Ordered: Current medicines are reviewed at length with the patient today.  Concerns regarding medicines are outlined above.  Orders Placed This Encounter  Procedures  . Ambulatory referral to Podiatry  . EKG 12-Lead  . ECHOCARDIOGRAM COMPLETE   No orders of the defined types were placed in this encounter.   Patient Instructions  Medication Instructions:  No medication changes. *If you need a refill on your cardiac medications before your next appointment, please call your pharmacy*   Lab Work: None ordered If you have labs (blood work) drawn today and your tests are completely normal, you will receive your results only by: Marland Kitchen MyChart Message (if you have MyChart) OR . A paper copy in the mail If you have any lab test that is abnormal or we need to change your treatment, we will call you to review the results.   Testing/Procedures: Your physician has requested that you have an echocardiogram. Echocardiography is a painless test that uses sound waves to create images of your heart. It provides your doctor with information about the size and shape of your heart and how well your heart's chambers and valves are working. This procedure takes approximately one hour. There are no restrictions for this procedure.     Follow-Up: At [facility], you and your health needs are our priority.  As part of our continuing mission to provide you with exceptional heart care, we have created designated Provider Care Teams.  These Care Teams include your primary Cardiologist (physician) and Advanced Practice Providers (APPs -  Physician Assistants and Nurse Practitioners) who all work together to provide you with the care you need, when you need it.  We recommend  signing up for the patient  portal called "MyChart".  Sign up information is provided on this After Visit Summary.  MyChart is used to connect with patients for Virtual Visits (Telemedicine).  Patients are able to view lab/test results, encounter notes, upcoming appointments, etc.  Non-urgent messages can be sent to your provider as well.   To learn more about what you can do with MyChart, go to [email or web address].    Your next appointment:   6 week(s)  The format for your next appointment:   In Person  Provider:   Berniece Salines, DO   Other Instructions  Echocardiogram An echocardiogram is a procedure that uses painless sound waves (ultrasound) to produce an image of the heart. Images from an echocardiogram can provide important information about:  Signs of coronary artery disease (CAD).  Aneurysm detection. An aneurysm is a weak or damaged part of an artery wall that bulges out from the normal force of blood pumping through the body.  Heart size and shape. Changes in the size or shape of the heart can be associated with certain conditions, including heart failure, aneurysm, and CAD.  Heart muscle function.  Heart valve function.  Signs of a past heart attack.  Fluid buildup around the heart.  Thickening of the heart muscle.  A tumor or infectious growth around the heart valves. Tell a health care provider about:  Any allergies you have.  All medicines you are taking, including vitamins, herbs, eye drops, creams, and over-the-counter medicines.  Any blood disorders you have.  Any surgeries you have had.  Any medical conditions you have.  Whether you are pregnant or may be pregnant. What are the risks? Generally, this is a safe procedure. However, problems may occur, including:  Allergic reaction to dye (contrast) that may be used during the procedure. What happens before the procedure? No specific preparation is needed. You may eat and drink normally. What happens during the  procedure?   An IV tube may be inserted into one of your veins.  You may receive contrast through this tube. A contrast is an injection that improves the quality of the pictures from your heart.  A gel will be applied to your chest.  A wand-like tool (transducer) will be moved over your chest. The gel will help to transmit the sound waves from the transducer.  The sound waves will harmlessly bounce off of your heart to allow the heart images to be captured in real-time motion. The images will be recorded on a computer. The procedure may vary among health care providers and hospitals. What happens after the procedure?  You may return to your normal, everyday life, including diet, activities, and medicines, unless your health care provider tells you not to do that. Summary  An echocardiogram is a procedure that uses painless sound waves (ultrasound) to produce an image of the heart.  Images from an echocardiogram can provide important information about the size and shape of your heart, heart muscle function, heart valve function, and fluid buildup around your heart.  You do not need to do anything to prepare before this procedure. You may eat and drink normally.  After the echocardiogram is completed, you may return to your normal, everyday life, unless your health care provider tells you not to do that. This information is not intended to replace advice given to you by your health care provider. Make sure you discuss any questions you have with your health care provider. Document Revised: 12/28/2018 Document Reviewed: 10/09/2016 Elsevier  Patient Education  [employer].      Adopting a Healthy Lifestyle.  Know what a healthy weight is for you (roughly BMI <25) and aim to maintain this   Aim for 7+ servings of fruits and vegetables daily   65-80+ fluid ounces of water or unsweet tea for healthy kidneys   Limit to max 1 drink of alcohol per day; avoid smoking/tobacco    Limit animal fats in diet for cholesterol and heart health - choose grass fed whenever available   Avoid highly processed foods, and foods high in saturated/trans fats   Aim for low stress - take time to unwind and care for your mental health   Aim for 150 min of moderate intensity exercise weekly for heart health, and weights twice weekly for bone health   Aim for 7-9 hours of sleep daily   When it comes to diets, agreement about the perfect plan isnt easy to find, even among the experts. Experts at the [place] [employer] developed an idea known as the Healthy Eating Plate. Just imagine a plate divided into logical, healthy portions.   The emphasis is on diet quality:   Load up on vegetables and fruits - one-half of your plate: Aim for color and variety, and remember that potatoes dont count.   Go for whole grains - one-quarter of your plate: Whole wheat, barley, wheat berries, quinoa, oats, brown rice, and foods made with them. If you want pasta, go with whole wheat pasta.   Protein power - one-quarter of your plate: Fish, chicken, beans, and nuts are all healthy, versatile protein sources. Limit red meat.   The diet, however, does go beyond the plate, offering a few other suggestions.   Use healthy plant oils, such as olive, canola, soy, corn, sunflower and peanut. Check the labels, and avoid partially hydrogenated oil, which have unhealthy trans fats.   If youre thirsty, drink water. Coffee and tea are good in moderation, but skip sugary drinks and limit milk and dairy products to one or two daily servings.   The type of carbohydrate in the diet is more important than the amount. Some sources of carbohydrates, such as vegetables, fruits, whole grains, and beans-are healthier than others.   Finally, stay active  Signed, Berniece Salines, DO  06/16/2020 11:18 AM    [place]

## 2020-06-17 DIAGNOSIS — I2510 Atherosclerotic heart disease of native coronary artery without angina pectoris: Secondary | ICD-10-CM | POA: Diagnosis not present

## 2020-06-17 DIAGNOSIS — Z6836 Body mass index (BMI) 36.0-36.9, adult: Secondary | ICD-10-CM | POA: Diagnosis not present

## 2020-06-17 DIAGNOSIS — E782 Mixed hyperlipidemia: Secondary | ICD-10-CM | POA: Diagnosis not present

## 2020-06-17 DIAGNOSIS — E1169 Type 2 diabetes mellitus with other specified complication: Secondary | ICD-10-CM | POA: Diagnosis not present

## 2020-06-17 DIAGNOSIS — F339 Major depressive disorder, recurrent, unspecified: Secondary | ICD-10-CM | POA: Diagnosis not present

## 2020-06-17 DIAGNOSIS — E785 Hyperlipidemia, unspecified: Secondary | ICD-10-CM | POA: Diagnosis not present

## 2020-06-17 DIAGNOSIS — J309 Allergic rhinitis, unspecified: Secondary | ICD-10-CM | POA: Diagnosis not present

## 2020-06-17 DIAGNOSIS — I10 Essential (primary) hypertension: Secondary | ICD-10-CM | POA: Diagnosis not present

## 2020-06-17 DIAGNOSIS — Z0000 Encounter for general adult medical examination without abnormal findings: Secondary | ICD-10-CM | POA: Diagnosis not present

## 2020-06-19 DIAGNOSIS — I10 Essential (primary) hypertension: Secondary | ICD-10-CM | POA: Diagnosis not present

## 2020-06-19 DIAGNOSIS — E782 Mixed hyperlipidemia: Secondary | ICD-10-CM | POA: Diagnosis not present

## 2020-06-24 ENCOUNTER — Encounter: Payer: Self-pay | Admitting: Sports Medicine

## 2020-06-24 ENCOUNTER — Ambulatory Visit: Payer: Medicare HMO | Admitting: Sports Medicine

## 2020-06-24 ENCOUNTER — Other Ambulatory Visit: Payer: Self-pay

## 2020-06-24 DIAGNOSIS — L84 Corns and callosities: Secondary | ICD-10-CM | POA: Diagnosis not present

## 2020-06-24 DIAGNOSIS — E0800 Diabetes mellitus due to underlying condition with hyperosmolarity without nonketotic hyperglycemic-hyperosmolar coma (NKHHC): Secondary | ICD-10-CM

## 2020-06-24 DIAGNOSIS — L603 Nail dystrophy: Secondary | ICD-10-CM

## 2020-06-24 DIAGNOSIS — L97429 Non-pressure chronic ulcer of left heel and midfoot with unspecified severity: Secondary | ICD-10-CM | POA: Diagnosis not present

## 2020-06-24 NOTE — Progress Notes (Signed)
Subjective: Julie Leblanc is a 74 y.o. female patient seen in office for evaluation of ulceration of the left heel that has been present for 6 months was previously treated by wound center but never completely healed. Patient also reports that she gets callus to her toes and gets little flecks of nail that still try to come back on her left 1st toe. Patient has a history of diabetes and a blood glucose level  today of elevated ~200. Patient has no other pedal complaints at this time.  Review of Systems  All other systems reviewed and are negative.    Patient Active Problem List   Diagnosis Date Noted  . Diabetes mellitus ([place])   . GERD (gastroesophageal reflux disease)   . Hyperlipemia   . Hypertension   . Neuropathy   . Coronary artery disease involving coronary bypass graft of native heart without angina pectoris 02/09/2018  . Cellulitis of both lower extremities 06/17/2014  . Obesity, Class I, BMI 30.0-34.9 (see actual BMI) 06/14/2014  . Essential hypertension 05/10/2014  . Chest pain 05/10/2014  . Abnormal nuclear stress test 05/10/2014  . Preoperative cardiovascular examination 04/05/2014  . S/P CABG x 3 10/05/2013  . DM2 (diabetes mellitus, type 2) ([place]) 10/05/2013  . Dyslipidemia 10/05/2013  . Aortic valve stenosis 10/05/2013  . Dizzy 03/27/2013  . CAD (coronary artery disease) 03/27/2013  . Dark stools 03/27/2013  . Knee joint pain 12/21/2012  . Low back pain 12/21/2012  . Aortic stenosis, mild 10/2012   Current Outpatient Medications on File Prior to Visit  Medication Sig Dispense Refill  . acetaminophen (TYLENOL) 650 MG CR tablet Take 1,300 mg by mouth daily as needed for pain.    Marland Kitchen ALPRAZolam (XANAX) 1 MG tablet Take 1 mg by mouth 2 (two) times daily as needed for anxiety.     Marland Kitchen apixaban (ELIQUIS) 5 MG TABS tablet Take 5 mg by mouth 2 (two) times daily.    Marland Kitchen aspirin EC 81 MG tablet Take 81 mg by mouth daily. Swallow whole.    Marland Kitchen atorvastatin (LIPITOR) 40 MG tablet  Take 1 tablet by mouth daily.    . bisacodyl (DULCOLAX) 5 MG EC tablet Take 5 mg by mouth daily as needed for moderate constipation.    . celecoxib (CELEBREX) 200 MG capsule Take 1 capsule by mouth daily.    . chlorthalidone (HYGROTON) 25 MG tablet Take 12.5 mg by mouth daily.    Marland Kitchen diltiazem (CARDIZEM SR) 120 MG 12 hr capsule Take 1 capsule (120 mg total) by mouth 2 (two) times daily. 60 capsule 11  . diphenhydramine-acetaminophen (TYLENOL PM) 25-500 MG TABS Take 1 tablet by mouth at bedtime as needed (for sleep).    . EPINEPHrine (EPI-PEN) 0.3 mg/0.3 mL SOAJ injection Inject 0.3 mg into the muscle once as needed (for peanut allergy).     . fenofibrate (TRICOR) 145 MG tablet Take 1 tablet (145 mg total) by mouth daily. 90 tablet 3  . furosemide (LASIX) 40 MG tablet Take 40 mg by mouth daily.    Marland Kitchen gabapentin (NEURONTIN) 300 MG capsule Take 600 mg by mouth 3 (three) times daily.     Marland Kitchen icosapent Ethyl (VASCEPA) 1 g capsule Take 2 g by mouth 2 (two) times daily.    . irbesartan (AVAPRO) 300 MG tablet Take 1 tablet (300 mg total) by mouth daily. 30 tablet 5  . Loratadine 10 MG CAPS Take 1 tablet by mouth daily.    . metFORMIN (GLUCOPHAGE-XR) 750 MG 24  hr tablet Take 1,500 mg by mouth daily with breakfast.    . metoprolol (LOPRESSOR) 50 MG tablet Take 50 mg by mouth 3 (three) times daily.    . Multiple Vitamin (MULTIVITAMIN WITH MINERALS) TABS Take 1 tablet by mouth daily.    . nitroGLYCERIN (NITROSTAT) 0.4 MG SL tablet Place 1 tablet (0.4 mg total) under the tongue every 5 (five) minutes as needed for chest pain. 25 tablet 3  . pantoprazole (PROTONIX) 40 MG tablet Take 40 mg by mouth daily.     Marland Kitchen tiZANidine (ZANAFLEX) 4 MG tablet Take 2-4 mg by mouth daily as needed for muscle spasms.     . traZODone (DESYREL) 100 MG tablet Take 100 mg by mouth at bedtime as needed.    . venlafaxine XR (EFFEXOR-XR) 75 MG 24 hr capsule Take 1 capsule by mouth daily.     No current facility-administered medications on  file prior to visit.   Allergies  Allergen Reactions  . Other Anaphylaxis and Swelling    Nuts   . Peanut Oil Shortness Of Breath  . Penicillins     No results found for this or any previous visit (from the past 2160 hour(s)).  Objective: There were no vitals filed for this visit.  General: Patient is awake, alert, oriented x 3 and in no acute distress.  Dermatology: Skin is warm and dry bilateral with a full thickness ulceration present  posterior heel on left. Ulceration measures 0.5 cm x 0.3cm x 0.2 cm. There is a  Keratotic border with a granular base. The ulceration does not  probe to bone. There is no malodor, no active drainage, no erythema, no edema. No acute signs of infection.  Callus sub met 5 on right and left 1st toe medial aspect with no signs of infection.  Left 1st toe history of nail removal with small specks in the nail bed with no signs of infection.    Vascular: Dorsalis Pedis pulse = 1/4 Bilateral,  Posterior Tibial pulse = 1/4 Bilateral,  Capillary Fill Time < 5 seconds  Neurologic: Protective severely diminished bilateral.  Musculosketal: There is mild pain with palpation to ulcerated area. No pain with compression to calves bilateral. +Bunion and hammertoe deformity bilateral.  No results for input(s): GRAMSTAIN, LABORGA in the last 8760 hours.  Assessment and Plan:  Problem List Items Addressed This Visit      Endocrine   Diabetes mellitus ([place])    Other Visit Diagnoses    Ulcer of left heel and midfoot, unspecified ulcer stage ([place])    -  Primary   Relevant Orders   WOUND CULTURE   Callus       Nail dystrophy           -Examined patient and discussed the progression of the wound and treatment alternatives. -Cleansed left heel ulcer and wound culture was obtained, will call patient if she needs PO antibiotics based on culture -Applied medihoney and bandaid dressing and instructed patient to continue with daily dressings at home consisting  of the same - Advised patient to go to the ER or return to office if the wound worsens or if constitutional symptoms are present. -Debrided callus x 2 using sterile chisel blade without incident and trimmed left hallux flecks of nail using nail nipper without incident and advised patient to refrain from picking at her nail -Patient to return to office in 2 weeks for follow up care and evaluation or sooner if problems arise.  Landis Martins, DPM

## 2020-06-27 ENCOUNTER — Other Ambulatory Visit: Payer: Self-pay | Admitting: Sports Medicine

## 2020-06-27 ENCOUNTER — Telehealth: Payer: Self-pay | Admitting: *Deleted

## 2020-06-27 LAB — WOUND CULTURE

## 2020-06-27 MED ORDER — SULFAMETHOXAZOLE-TRIMETHOPRIM 400-80 MG PO TABS: 1.0000 | ORAL_TABLET | Freq: Two times a day (BID) | ORAL | 0 refills | Status: DC

## 2020-06-27 NOTE — Telephone Encounter (Signed)
Called and spoke with the patient and relayed the message per Dr Stover. Carita Sollars °

## 2020-06-27 NOTE — Progress Notes (Signed)
Called and spoke with patient and relayed the message per Dr Cannon Kettle. Julie Leblanc

## 2020-06-27 NOTE — Progress Notes (Signed)
Sent bactrim for + MRSA wound culture

## 2020-06-27 NOTE — Telephone Encounter (Signed)
-----   Message from Landis Martins, [place] sent at 06/27/2020  7:22 AM EDT ----- Will you let patient know I sent Bactrim to her pharmacy for + wound culture, MRSA Thanks Dr. Chauncey Cruel

## 2020-06-30 ENCOUNTER — Other Ambulatory Visit: Payer: Self-pay | Admitting: Sports Medicine

## 2020-06-30 ENCOUNTER — Encounter: Payer: Self-pay | Admitting: Sports Medicine

## 2020-06-30 MED ORDER — CIPROFLOXACIN HCL 500 MG PO TABS: 500.0000 mg | ORAL_TABLET | Freq: Two times a day (BID) | ORAL | 0 refills | Status: DC

## 2020-06-30 NOTE — Progress Notes (Signed)
Changed Bactrim to Cipro due to N/V intolerance

## 2020-07-01 ENCOUNTER — Telehealth: Payer: Self-pay | Admitting: *Deleted

## 2020-07-01 NOTE — Telephone Encounter (Signed)
-----   Message from Landis Martins, [place] sent at 06/30/2020 10:07 AM EDT ----- Regarding: RE: Have her stop this medication and I will send cipro 500mg  bid x 10 days to her pharmacy later this morning for her to take instead.  Thanks Dr. Chauncey Cruel ----- Message ----- From: Viviana Simpler, [facility] Sent: 06/30/2020  10:02 AM EDT To: Landis Martins, DPM  Hey-patient called and stated that she could not take the medicine had sulfa in it and caused her to be sick and throw up and sweat. Please Advise. [name]

## 2020-07-01 NOTE — Telephone Encounter (Signed)
Called and spoke with the patient today and stated that the pharmacy has the new antibiotic that Dr Cannon Kettle re-sent over (Cipro) and that the patient can go and pick it up today. Julie Leblanc

## 2020-07-08 ENCOUNTER — Encounter: Payer: Self-pay | Admitting: Sports Medicine

## 2020-07-08 ENCOUNTER — Ambulatory Visit: Payer: Medicare HMO | Admitting: Sports Medicine

## 2020-07-08 ENCOUNTER — Other Ambulatory Visit: Payer: Self-pay

## 2020-07-08 DIAGNOSIS — L97429 Non-pressure chronic ulcer of left heel and midfoot with unspecified severity: Secondary | ICD-10-CM | POA: Diagnosis not present

## 2020-07-08 DIAGNOSIS — E0800 Diabetes mellitus due to underlying condition with hyperosmolarity without nonketotic hyperglycemic-hyperosmolar coma (NKHHC): Secondary | ICD-10-CM

## 2020-07-08 NOTE — Progress Notes (Signed)
Subjective: Julie Leblanc is a 74 y.o. female patient seen in office for follow-up evaluation of left heel ulcer.  Patient reports that the left heel thing but that is doing better not draining as much.  Patient reports that Cipro antibiotic seems to be more tolerable denies nausea vomiting fever chills or any other constitutional symptoms at this time.  Patient Active Problem List   Diagnosis Date Noted  . Diabetes mellitus ([place])   . GERD (gastroesophageal reflux disease)   . Hyperlipemia   . Hypertension   . Neuropathy   . Coronary artery disease involving coronary bypass graft of native heart without angina pectoris 02/09/2018  . Cellulitis of both lower extremities 06/17/2014  . Obesity, Class I, BMI 30.0-34.9 (see actual BMI) 06/14/2014  . Essential hypertension 05/10/2014  . Chest pain 05/10/2014  . Abnormal nuclear stress test 05/10/2014  . Preoperative cardiovascular examination 04/05/2014  . S/P CABG x 3 10/05/2013  . DM2 (diabetes mellitus, type 2) ([place]) 10/05/2013  . Dyslipidemia 10/05/2013  . Aortic valve stenosis 10/05/2013  . Dizzy 03/27/2013  . CAD (coronary artery disease) 03/27/2013  . Dark stools 03/27/2013  . Knee joint pain 12/21/2012  . Low back pain 12/21/2012  . Aortic stenosis, mild 10/2012   Current Outpatient Medications on File Prior to Visit  Medication Sig Dispense Refill  . acetaminophen (TYLENOL) 650 MG CR tablet Take 1,300 mg by mouth daily as needed for pain.    Marland Kitchen ALPRAZolam (XANAX) 1 MG tablet Take 1 mg by mouth 2 (two) times daily as needed for anxiety.     Marland Kitchen apixaban (ELIQUIS) 5 MG TABS tablet Take 5 mg by mouth 2 (two) times daily.    Marland Kitchen aspirin EC 81 MG tablet Take 81 mg by mouth daily. Swallow whole.    Marland Kitchen atorvastatin (LIPITOR) 40 MG tablet Take 1 tablet by mouth daily.    . bisacodyl (DULCOLAX) 5 MG EC tablet Take 5 mg by mouth daily as needed for moderate constipation.    . celecoxib (CELEBREX) 200 MG capsule Take 1 capsule by mouth  daily.    . chlorthalidone (HYGROTON) 25 MG tablet Take 12.5 mg by mouth daily.    . ciprofloxacin (CIPRO) 500 MG tablet Take 1 tablet (500 mg total) by mouth 2 (two) times daily. 20 tablet 0  . diltiazem (CARDIZEM SR) 120 MG 12 hr capsule Take 1 capsule (120 mg total) by mouth 2 (two) times daily. 60 capsule 11  . diphenhydramine-acetaminophen (TYLENOL PM) 25-500 MG TABS Take 1 tablet by mouth at bedtime as needed (for sleep).    . EPINEPHrine (EPI-PEN) 0.3 mg/0.3 mL SOAJ injection Inject 0.3 mg into the muscle once as needed (for peanut allergy).     . fenofibrate (TRICOR) 145 MG tablet Take 1 tablet (145 mg total) by mouth daily. 90 tablet 3  . furosemide (LASIX) 40 MG tablet Take 40 mg by mouth daily.    Marland Kitchen gabapentin (NEURONTIN) 300 MG capsule Take 600 mg by mouth 3 (three) times daily.     Marland Kitchen icosapent Ethyl (VASCEPA) 1 g capsule Take 2 g by mouth 2 (two) times daily.    . irbesartan (AVAPRO) 300 MG tablet Take 1 tablet (300 mg total) by mouth daily. 30 tablet 5  . Loratadine 10 MG CAPS Take 1 tablet by mouth daily.    . metFORMIN (GLUCOPHAGE-XR) 750 MG 24 hr tablet Take 1,500 mg by mouth daily with breakfast.    . metoprolol (LOPRESSOR) 50 MG tablet Take 50  mg by mouth 3 (three) times daily.    . Multiple Vitamin (MULTIVITAMIN WITH MINERALS) TABS Take 1 tablet by mouth daily.    . nitroGLYCERIN (NITROSTAT) 0.4 MG SL tablet Place 1 tablet (0.4 mg total) under the tongue every 5 (five) minutes as needed for chest pain. 25 tablet 3  . pantoprazole (PROTONIX) 40 MG tablet Take 40 mg by mouth daily.     Marland Kitchen tiZANidine (ZANAFLEX) 4 MG tablet Take 2-4 mg by mouth daily as needed for muscle spasms.     . traZODone (DESYREL) 100 MG tablet Take 100 mg by mouth at bedtime as needed.    . venlafaxine XR (EFFEXOR-XR) 75 MG 24 hr capsule Take 1 capsule by mouth daily.     No current facility-administered medications on file prior to visit.   Allergies  Allergen Reactions  . Other Anaphylaxis and  Swelling    Nuts   . Peanut Oil Shortness Of Breath  . Penicillins     Recent Results (from the past 2160 hour(s))  WOUND CULTURE     Status: Abnormal   Collection Time: 06/24/20  4:48 PM   Specimen: Foot, Left; Wound   Wound Culture and sens  Result Value Ref Range   Gram Stain Result Final report    Organism ID, Bacteria Comment     Comment: Rare white blood cells.   Organism ID, Bacteria Comment     Comment: Few gram positive cocci   Aerobic Bacterial Culture Final report (A)    Organism ID, Bacteria Comment (A)     Comment: Methicillin - resistant Staphylococcus aureus Based on resistance to oxacillin this isolate would be resistant to all currently available beta-lactam antimicrobial agents, with the exception of the newer cephalosporins with anti-MRSA activity, such as Ceftaroline Heavy growth    Antimicrobial Susceptibility Comment     Comment:       ** S = Susceptible; I = Intermediate; R = Resistant **                    P = Positive; N = Negative             MICS are expressed in micrograms per mL    Antibiotic                 RSLT#1    RSLT#2    RSLT#3    RSLT#4 Ciprofloxacin                  S Clindamycin                    I Erythromycin                   S Gentamicin                     S Levofloxacin                   S Linezolid                      S Oxacillin                      R Penicillin                     R Rifampin  S Tetracycline                   S Trimethoprim/Sulfa             S Vancomycin                     S     Objective: There were no vitals filed for this visit.  General: Patient is awake, alert, oriented x 3 and in no acute distress.  Dermatology: Skin is warm and dry bilateral with a full thickness ulceration present posterior heel on left. Ulceration measures 0.4 cm x 0.3cm x 0.2 cm. There is a Keratotic border with a granular base. The ulceration does not  probe to bone. There is no malodor, no active  drainage, no erythema, no edema. No acute signs of infection.  Minimal reactive callus sub met 5 on right and left 1st toe medial aspect with no signs of infection.  Left 1st toe history of nail removal with small specks in the nail bed with no signs of infection.    Vascular: Dorsalis Pedis pulse = 1/4 Bilateral,  Posterior Tibial pulse = 1/4 Bilateral,  Capillary Fill Time < 5 seconds  Neurologic: Protective severely diminished bilateral.  Musculosketal: There is mild pain with palpation to ulcerated area that is improving compared to last visit. No pain with compression to calves bilateral. +Bunion and hammertoe deformity bilateral.  No results for input(s): GRAMSTAIN, LABORGA in the last 8760 hours.  Assessment and Plan:  Problem List Items Addressed This Visit      Endocrine   Diabetes mellitus ([place])    Other Visit Diagnoses    Ulcer of left heel and midfoot, unspecified ulcer stage ([place])    -  Primary       -Examined patient and discussed the progression of the wound and treatment alternatives. - Excisionally dedbrided ulceration at posterior left heel to healthy bleeding borders removing nonviable tissue using a sterile chisel blade. Wound measures post debridement as above. Wound was debrided to the level of the dermis with viable wound base exposed to promote healing. Hemostasis was achieved with manuel pressure. Patient tolerated procedure well without any discomfort or anesthesia necessary for this wound debridement.  -Applied medihoney and bandaid dressing and instructed patient to continue with daily dressings at home consisting of the same - Advised patient to go to the ER or return to office if the wound worsens or if constitutional symptoms are present. -Continue with Cipro antibiotic until completed -Patient to return to office in 2 weeks for follow up care and evaluation or sooner if problems arise.  Landis Martins, DPM

## 2020-07-09 ENCOUNTER — Ambulatory Visit (INDEPENDENT_AMBULATORY_CARE_PROVIDER_SITE_OTHER): Payer: Medicare HMO

## 2020-07-09 DIAGNOSIS — I350 Nonrheumatic aortic (valve) stenosis: Secondary | ICD-10-CM | POA: Diagnosis not present

## 2020-07-09 LAB — ECHOCARDIOGRAM COMPLETE
AR max vel: 1.02 cm2
AV Area VTI: 0.96 cm2
AV Area mean vel: 0.97 cm2
AV Mean grad: 25.3 mmHg
AV Peak grad: 42.2 mmHg
Ao pk vel: 3.25 m/s
Area-P 1/2: 3.27 cm2
S' Lateral: 3 cm

## 2020-07-09 NOTE — Progress Notes (Signed)
Complete echocardiogram performed.  Jimmy Orie [place] RDCS, RVT  

## 2020-07-11 ENCOUNTER — Telehealth: Payer: Self-pay

## 2020-07-11 NOTE — Telephone Encounter (Signed)
Spoke with patient regarding results and recommendation.  Patient verbalizes understanding and is agreeable to plan of care. Advised patient to call back with any issues or concerns.  

## 2020-07-11 NOTE — Telephone Encounter (Signed)
-----   Message from Berniece Salines, DO sent at 07/11/2020  9:58 AM EDT ----- I am looking forward to see you on November 4 to discuss your echo results.  Your echo showed moderate aortic valve stenosis.  We will discuss more details at your next visit.

## 2020-07-11 NOTE — Telephone Encounter (Signed)
Left message on patients voicemail to please return our call.   

## 2020-07-11 NOTE — Telephone Encounter (Signed)
Patient returning call.

## 2020-07-23 ENCOUNTER — Other Ambulatory Visit: Payer: Self-pay

## 2020-07-23 ENCOUNTER — Encounter: Payer: Self-pay | Admitting: Sports Medicine

## 2020-07-23 ENCOUNTER — Ambulatory Visit: Payer: Medicare HMO | Admitting: Sports Medicine

## 2020-07-23 DIAGNOSIS — L97429 Non-pressure chronic ulcer of left heel and midfoot with unspecified severity: Secondary | ICD-10-CM | POA: Diagnosis not present

## 2020-07-23 DIAGNOSIS — E0800 Diabetes mellitus due to underlying condition with hyperosmolarity without nonketotic hyperglycemic-hyperosmolar coma (NKHHC): Secondary | ICD-10-CM

## 2020-07-23 DIAGNOSIS — G629 Polyneuropathy, unspecified: Secondary | ICD-10-CM

## 2020-07-23 NOTE — Progress Notes (Signed)
Subjective: Julie Leblanc is a 74 y.o. female patient seen in office for follow-up evaluation of left heel ulcer.  Patient reports that her heel seems to be doing good draining very little but does have some pain that sharp shooting that goes from her heel up the back of her leg to behind her knee that is random that comes and goes. Patient denies nausea vomiting fever chills or any other constitutional symptoms at this time.  Patient Active Problem List   Diagnosis Date Noted  . Diabetes mellitus ([place])   . GERD (gastroesophageal reflux disease)   . Hyperlipemia   . Hypertension   . Neuropathy   . Coronary artery disease involving coronary bypass graft of native heart without angina pectoris 02/09/2018  . Cellulitis of both lower extremities 06/17/2014  . Obesity, Class I, BMI 30.0-34.9 (see actual BMI) 06/14/2014  . Essential hypertension 05/10/2014  . Chest pain 05/10/2014  . Abnormal nuclear stress test 05/10/2014  . Preoperative cardiovascular examination 04/05/2014  . S/P CABG x 3 10/05/2013  . DM2 (diabetes mellitus, type 2) ([place]) 10/05/2013  . Dyslipidemia 10/05/2013  . Aortic valve stenosis 10/05/2013  . Dizzy 03/27/2013  . CAD (coronary artery disease) 03/27/2013  . Dark stools 03/27/2013  . Knee joint pain 12/21/2012  . Low back pain 12/21/2012  . Aortic stenosis, mild 10/2012   Current Outpatient Medications on File Prior to Visit  Medication Sig Dispense Refill  . acetaminophen (TYLENOL) 650 MG CR tablet Take 1,300 mg by mouth daily as needed for pain.    Marland Kitchen ALPRAZolam (XANAX) 1 MG tablet Take 1 mg by mouth 2 (two) times daily as needed for anxiety.     Marland Kitchen apixaban (ELIQUIS) 5 MG TABS tablet Take 5 mg by mouth 2 (two) times daily.    Marland Kitchen aspirin EC 81 MG tablet Take 81 mg by mouth daily. Swallow whole.    Marland Kitchen atorvastatin (LIPITOR) 40 MG tablet Take 1 tablet by mouth daily.    . bisacodyl (DULCOLAX) 5 MG EC tablet Take 5 mg by mouth daily as needed for moderate  constipation.    . celecoxib (CELEBREX) 200 MG capsule Take 1 capsule by mouth daily.    . chlorthalidone (HYGROTON) 25 MG tablet Take 12.5 mg by mouth daily.    . ciprofloxacin (CIPRO) 500 MG tablet Take 1 tablet (500 mg total) by mouth 2 (two) times daily. 20 tablet 0  . diltiazem (CARDIZEM SR) 120 MG 12 hr capsule Take 1 capsule (120 mg total) by mouth 2 (two) times daily. 60 capsule 11  . diphenhydramine-acetaminophen (TYLENOL PM) 25-500 MG TABS Take 1 tablet by mouth at bedtime as needed (for sleep).    . EPINEPHrine (EPI-PEN) 0.3 mg/0.3 mL SOAJ injection Inject 0.3 mg into the muscle once as needed (for peanut allergy).     . fenofibrate (TRICOR) 145 MG tablet Take 1 tablet (145 mg total) by mouth daily. 90 tablet 3  . furosemide (LASIX) 40 MG tablet Take 40 mg by mouth daily.    Marland Kitchen gabapentin (NEURONTIN) 300 MG capsule Take 600 mg by mouth 3 (three) times daily.     Marland Kitchen icosapent Ethyl (VASCEPA) 1 g capsule Take 2 g by mouth 2 (two) times daily.    . irbesartan (AVAPRO) 300 MG tablet Take 1 tablet (300 mg total) by mouth daily. 30 tablet 5  . Loratadine 10 MG CAPS Take 1 tablet by mouth daily.    . metFORMIN (GLUCOPHAGE-XR) 750 MG 24 hr tablet Take 1,500  mg by mouth daily with breakfast.    . metoprolol (LOPRESSOR) 50 MG tablet Take 50 mg by mouth 3 (three) times daily.    . Multiple Vitamin (MULTIVITAMIN WITH MINERALS) TABS Take 1 tablet by mouth daily.    . nitroGLYCERIN (NITROSTAT) 0.4 MG SL tablet Place 1 tablet (0.4 mg total) under the tongue every 5 (five) minutes as needed for chest pain. 25 tablet 3  . pantoprazole (PROTONIX) 40 MG tablet Take 40 mg by mouth daily.     Marland Kitchen tiZANidine (ZANAFLEX) 4 MG tablet Take 2-4 mg by mouth daily as needed for muscle spasms.     . traZODone (DESYREL) 100 MG tablet Take 100 mg by mouth at bedtime as needed.    . venlafaxine XR (EFFEXOR-XR) 75 MG 24 hr capsule Take 1 capsule by mouth daily.     No current facility-administered medications on file  prior to visit.   Allergies  Allergen Reactions  . Other Anaphylaxis and Swelling    Nuts   . Peanut Oil Shortness Of Breath  . Penicillins     Recent Results (from the past 2160 hour(s))  WOUND CULTURE     Status: Abnormal   Collection Time: 06/24/20  4:48 PM   Specimen: Foot, Left; Wound   Wound Culture and sens  Result Value Ref Range   Gram Stain Result Final report    Organism ID, Bacteria Comment     Comment: Rare white blood cells.   Organism ID, Bacteria Comment     Comment: Few gram positive cocci   Aerobic Bacterial Culture Final report (A)    Organism ID, Bacteria Comment (A)     Comment: Methicillin - resistant Staphylococcus aureus Based on resistance to oxacillin this isolate would be resistant to all currently available beta-lactam antimicrobial agents, with the exception of the newer cephalosporins with anti-MRSA activity, such as Ceftaroline Heavy growth    Antimicrobial Susceptibility Comment     Comment:       ** S = Susceptible; I = Intermediate; R = Resistant **                    P = Positive; N = Negative             MICS are expressed in micrograms per mL    Antibiotic                 RSLT#1    RSLT#2    RSLT#3    RSLT#4 Ciprofloxacin                  S Clindamycin                    I Erythromycin                   S Gentamicin                     S Levofloxacin                   S Linezolid                      S Oxacillin                      R Penicillin                     R Rifampin  S Tetracycline                   S Trimethoprim/Sulfa             S Vancomycin                     S   ECHOCARDIOGRAM COMPLETE     Status: None   Collection Time: 07/09/20  9:53 AM  Result Value Ref Range   S' Lateral 3.00 cm   Area-P 1/2 3.27 cm2   AV Area mean vel 0.97 cm2   AV Area VTI 0.96 cm2   AR max vel 1.02 cm2   AV Mean grad 25.3 mmHg   Ao pk vel 3.25 m/s   AV Peak grad 42.2 mmHg    Objective: There were no vitals  filed for this visit.  General: Patient is awake, alert, oriented x 3 and in no acute distress.  Dermatology: Skin is warm and dry bilateral with a full thickness ulceration present posterior heel on left. Ulceration measures 0.3 cm x 0.2cm x 0.2 cm, smaller than previous. There is a Keratotic border with a granular base. The ulceration does not  probe to bone. There is no malodor, no active drainage, no erythema, no edema. No acute signs of infection.  There is a small scrape to the medial lower leg self-induced by patient pulling off some skin at this area with no signs of infection.  Minimal reactive callus sub met 5 on right and left 1st toe medial aspect with no signs of infection.  Left 1st toe history of nail removal with small specks in the nail bed with no signs of infection.    Vascular: Dorsalis Pedis pulse = 1/4 Bilateral,  Posterior Tibial pulse = 1/4 Bilateral,  Capillary Fill Time < 5 seconds  Neurologic: Protective severely diminished bilateral.  Musculosketal: There is mild pain with palpation to ulcerated area that is improving compared to last visit. No pain with compression to calves bilateral. +Bunion and hammertoe deformity bilateral.  No results for input(s): GRAMSTAIN, LABORGA in the last 8760 hours.  Assessment and Plan:  Problem List Items Addressed This Visit      Endocrine   Diabetes mellitus ([place])     Nervous and Auditory   Neuropathy    Other Visit Diagnoses    Ulcer of left heel and midfoot, unspecified ulcer stage ([place])    -  Primary     -Examined patient and discussed the progression of the wound and treatment alternatives. - Excisionally dedbrided ulceration at posterior left heel to healthy bleeding borders removing nonviable tissue using a sterile chisel blade. Wound measures post debridement as above. Wound was debrided to the level of the dermis with viable wound base exposed to promote healing. Hemostasis was achieved with manuel pressure.  Patient tolerated procedure well without any discomfort or anesthesia necessary for this wound debridement.  -Applied medihoney and bandaid dressing and instructed patient to continue with daily dressings at home consisting of the same at both the heel and the leg until resolved -Advised patient that likely the sharp shooting pain is related to neuropathy continue with gabapentin and may discuss with PCP about increasing this dose - Advised patient to go to the ER or return to office if the wound worsens or if constitutional symptoms are present. -Patient to return to office in 2-3 weeks for follow up care and evaluation or sooner if problems arise.  Landis Martins, DPM

## 2020-07-24 ENCOUNTER — Ambulatory Visit: Payer: Medicare HMO | Admitting: Cardiology

## 2020-07-24 ENCOUNTER — Encounter: Payer: Self-pay | Admitting: Cardiology

## 2020-07-24 VITALS — BP 142/66 | HR 80 | Ht 63.0 in | Wt 196.8 lb

## 2020-07-24 DIAGNOSIS — R0600 Dyspnea, unspecified: Secondary | ICD-10-CM | POA: Diagnosis not present

## 2020-07-24 DIAGNOSIS — I5032 Chronic diastolic (congestive) heart failure: Secondary | ICD-10-CM | POA: Insufficient documentation

## 2020-07-24 DIAGNOSIS — I25810 Atherosclerosis of coronary artery bypass graft(s) without angina pectoris: Secondary | ICD-10-CM | POA: Diagnosis not present

## 2020-07-24 DIAGNOSIS — I350 Nonrheumatic aortic (valve) stenosis: Secondary | ICD-10-CM | POA: Diagnosis not present

## 2020-07-24 DIAGNOSIS — R0609 Other forms of dyspnea: Secondary | ICD-10-CM | POA: Insufficient documentation

## 2020-07-24 NOTE — Progress Notes (Addendum)
Cardiology Office Note:    Date:  07/24/2020   ID:  Julie Leblanc, DOB [birth year], MRN [id number]  PCP:  Mateo Flow, MD  Cardiologist:  Berniece Salines, DO  Electrophysiologist:  None   Referring MD: Mateo Flow, MD   " I am doing well"  History of Present Illness:    Julie Leblanc is a 74 y.o. female with a hx of coronary artery disease, post CABG in 2007, mild aortic stenosis, diabetes mellitus, dyslipidemia, hypertension follow is here today for follow-up visit. Patient tells me that she has been experiencing significant shortness of breath on exertion.  Is getting worse.  At her last visit we got a repeat echocardiogram to assess her valvular disease.  She is here today to discuss the results.  Past Medical History:  Diagnosis Date  . Abnormal nuclear stress test 05/10/2014  . Aortic stenosis, mild 10/2012   mild AS by Echo , mild MR  . Aortic valve stenosis 10/05/2013  . CAD (coronary artery disease)    hx CABG 2007, LAST NUC EF 70%-no ischemia  . Cellulitis of both lower extremities 06/17/2014  . Chest pain 05/10/2014  . Coronary artery disease involving coronary bypass graft of native heart without angina pectoris 02/09/2018  . Dark stools 03/27/2013  . Diabetes mellitus ([place])   . Dizzy 03/27/2013  . DM2 (diabetes mellitus, type 2) ([place]) 10/05/2013  . Dyslipidemia 10/05/2013  . Essential hypertension 05/10/2014  . GERD (gastroesophageal reflux disease)   . Hyperlipemia   . Hypertension   . Knee joint pain 12/21/2012  . Low back pain 12/21/2012  . Neuropathy   . Obesity, Class I, BMI 30.0-34.9 (see actual BMI) 06/14/2014  . Preoperative cardiovascular examination 04/05/2014  . S/P CABG x 3 10/05/2013   2007 - Dr. Lucianne Lei Trigt LIMA to LAD, SVG to diagonal and SVG to distal RCA     Past Surgical History:  Procedure Laterality Date  . ABDOMINAL HYSTERECTOMY  1990  . CORONARY ARTERY BYPASS GRAFT  01/24/2006   x 3,LIMA-LAD, VG-diag, VG-distal RCA  . GALLBLADDER SURGERY  1990's   . knee replacment  1990's  . LEFT HEART CATHETERIZATION WITH CORONARY/GRAFT ANGIOGRAM N/A 05/17/2014   Procedure: LEFT HEART CATHETERIZATION WITH Beatrix Fetters;  Surgeon: Jettie Booze, MD;  Location: [facility] CATH LAB;  Service: Cardiovascular;  Laterality: N/A;  . lung repair     s/p lap chole    Current Medications: Current Meds  Medication Sig  . acetaminophen (TYLENOL) 650 MG CR tablet Take 1,300 mg by mouth daily as needed for pain.  Marland Kitchen ALPRAZolam (XANAX) 1 MG tablet Take 1 mg by mouth 2 (two) times daily as needed for anxiety.   Marland Kitchen apixaban (ELIQUIS) 5 MG TABS tablet Take 5 mg by mouth 2 (two) times daily.  Marland Kitchen aspirin EC 81 MG tablet Take 81 mg by mouth daily. Swallow whole.  Marland Kitchen atorvastatin (LIPITOR) 40 MG tablet Take 1 tablet by mouth daily.  . bisacodyl (DULCOLAX) 5 MG EC tablet Take 5 mg by mouth daily as needed for moderate constipation.  . celecoxib (CELEBREX) 200 MG capsule Take 1 capsule by mouth daily.  . chlorthalidone (HYGROTON) 25 MG tablet Take 12.5 mg by mouth daily.  . ciprofloxacin (CIPRO) 500 MG tablet Take 1 tablet (500 mg total) by mouth 2 (two) times daily.  Marland Kitchen diltiazem (CARDIZEM SR) 120 MG 12 hr capsule Take 1 capsule (120 mg total) by mouth 2 (two) times daily.  . diphenhydramine-acetaminophen (TYLENOL PM) 25-500  MG TABS Take 1 tablet by mouth at bedtime as needed (for sleep).  . EPINEPHrine (EPI-PEN) 0.3 mg/0.3 mL SOAJ injection Inject 0.3 mg into the muscle once as needed (for peanut allergy).   . fenofibrate (TRICOR) 145 MG tablet Take 1 tablet (145 mg total) by mouth daily.  Marland Kitchen FLUZONE HIGH-DOSE QUADRIVALENT 0.7 ML SUSY   . furosemide (LASIX) 40 MG tablet Take 40 mg by mouth daily.  Marland Kitchen gabapentin (NEURONTIN) 300 MG capsule Take 600 mg by mouth 3 (three) times daily.   Marland Kitchen icosapent Ethyl (VASCEPA) 1 g capsule Take 2 g by mouth 2 (two) times daily.  . irbesartan (AVAPRO) 300 MG tablet Take 1 tablet (300 mg total) by mouth daily.  . Loratadine 10 MG CAPS  Take 1 tablet by mouth daily.  . metFORMIN (GLUCOPHAGE-XR) 750 MG 24 hr tablet Take 1,500 mg by mouth daily with breakfast.  . metoprolol (LOPRESSOR) 50 MG tablet Take 50 mg by mouth 3 (three) times daily.  . Multiple Vitamin (MULTIVITAMIN WITH MINERALS) TABS Take 1 tablet by mouth daily.  . nitroGLYCERIN (NITROSTAT) 0.4 MG SL tablet Place 1 tablet (0.4 mg total) under the tongue every 5 (five) minutes as needed for chest pain.  . pantoprazole (PROTONIX) 40 MG tablet Take 40 mg by mouth daily.   Marland Kitchen tiZANidine (ZANAFLEX) 4 MG tablet Take 2-4 mg by mouth daily as needed for muscle spasms.   . traZODone (DESYREL) 100 MG tablet Take 100 mg by mouth at bedtime as needed.  . venlafaxine XR (EFFEXOR-XR) 75 MG 24 hr capsule Take 1 capsule by mouth daily.     Allergies:   Other, Peanut oil, and Penicillins   Social History   Socioeconomic History  . Marital status: Widowed    Spouse name: Not on file  . Number of children: Not on file  . Years of education: Not on file  . Highest education level: Not on file  Occupational History  . Not on file  Tobacco Use  . Smoking status: Never Smoker  . Smokeless tobacco: Never Used  Substance and Sexual Activity  . Alcohol use: No  . Drug use: No  . Sexual activity: Not on file  Other Topics Concern  . Not on file  Social History Narrative  . Not on file   Social Determinants of Health   Financial Resource Strain:   . Difficulty of Paying Living Expenses: Not on file  Food Insecurity:   . Worried About Charity fundraiser in the Last Year: Not on file  . Ran Out of Food in the Last Year: Not on file  Transportation Needs:   . Lack of Transportation (Medical): Not on file  . Lack of Transportation (Non-Medical): Not on file  Physical Activity:   . Days of Exercise per Week: Not on file  . Minutes of Exercise per Session: Not on file  Stress:   . Feeling of Stress : Not on file  Social Connections:   . Frequency of Communication with  Friends and Family: Not on file  . Frequency of Social Gatherings with Friends and Family: Not on file  . Attends Religious Services: Not on file  . Active Member of Clubs or Organizations: Not on file  . Attends Archivist Meetings: Not on file  . Marital Status: Not on file     Family History: The patient's family history includes Diabetes in her brother and mother; Diabetes (age of onset: 44) in her father; Heart disease (age  of onset: 25) in her brother; Heart disease (age of onset: 42) in her mother.  ROS:   Review of Systems  Constitution: Negative for decreased appetite, fever and weight gain.  HENT: Negative for congestion, ear discharge, hoarse voice and sore throat.   Eyes: Negative for discharge, redness, vision loss in right eye and visual halos.  Cardiovascular: Negative for chest pain, dyspnea on exertion, leg swelling, orthopnea and palpitations.  Respiratory: Negative for cough, hemoptysis, shortness of breath and snoring.   Endocrine: Negative for heat intolerance and polyphagia.  Hematologic/Lymphatic: Negative for bleeding problem. Does not bruise/bleed easily.  Skin: Negative for flushing, nail changes, rash and suspicious lesions.  Musculoskeletal: Negative for arthritis, joint pain, muscle cramps, myalgias, neck pain and stiffness.  Gastrointestinal: Negative for abdominal pain, bowel incontinence, diarrhea and excessive appetite.  Genitourinary: Negative for decreased libido, genital sores and incomplete emptying.  Neurological: Negative for brief paralysis, focal weakness, headaches and loss of balance.  Psychiatric/Behavioral: Negative for altered mental status, depression and suicidal ideas.  Allergic/Immunologic: Negative for HIV exposure and persistent infections.    EKGs/Labs/Other Studies Reviewed:    The following studies were reviewed today:   EKG:  The ekg ordered today demonstrates simus rhythm 71 bpm, first-degree AV block  TTE  IMPRESSIONS  1. Left ventricular ejection fraction, by estimation, is 60 to 65%. The left ventricle has normal function. The left ventricle has no regional  wall motion abnormalities. Left ventricular diastolic parameters are consistent with Grade II diastolic  dysfunction (pseudonormalization).  2. Right ventricular systolic function is normal. The right ventricular size is normal. There is moderately elevated pulmonary artery systolic pressure.  3. Left atrial size was severely dilated.  4. The mitral valve is normal in structure. No evidence of mitral valve regurgitation. No evidence of mitral stenosis.  5. The aortic valve is normal in structure. There is moderate calcification of the aortic valve. There is moderate thickening of the aortic valve. Aortic valve regurgitation is not visualized. Moderate  aortic valve stenosis. Aortic valve mean gradient  measures 25.3 mmHg.  6. The inferior vena cava is normal in size with greater than 50%  respiratory variability, suggesting right atrial pressure of 3 mmHg.   Recent Labs: No results found for requested labs within last 8760 hours.  Recent Lipid Panel No results found for: CHOL, TRIG, HDL, CHOLHDL, VLDL, LDLCALC, LDLDIRECT  Physical Exam:    VS:  BP (!) 142/66   Pulse 80   Ht 5\' 3"  (1.6 m)   Wt 196 lb 12.8 oz (89.3 kg)   SpO2 95%   BMI 34.86 kg/m     Wt Readings from Last 3 Encounters:  07/24/20 196 lb 12.8 oz (89.3 kg)  06/16/20 199 lb 9.6 oz (90.5 kg)  07/09/19 201 lb 12.8 oz (91.5 kg)     GEN: Well nourished, well developed in no acute distress HEENT: Normal NECK: No JVD; No carotid bruits LYMPHATICS: No lymphadenopathy CARDIAC: S1S2 noted,RRR, no murmurs, rubs, gallops RESPIRATORY:  Clear to auscultation without rales, wheezing or rhonchi  ABDOMEN: Soft, non-tender, non-distended, +bowel sounds, no guarding. EXTREMITIES: No edema, No cyanosis, no clubbing MUSCULOSKELETAL:  No deformity  SKIN: Warm and  dry NEUROLOGIC:  Alert and oriented x 3, non-focal PSYCHIATRIC:  Normal affect, good insight  ASSESSMENT:    1. Coronary artery disease involving coronary bypass graft of native heart without angina pectoris   2. Nonrheumatic aortic valve stenosis   3. Dyspnea on exertion   4. Chronic diastolic heart  failure [facility])    PLAN:     Also able to discuss the patient testing results with her today her echocardiogram showed evidence of worsening aortic stenosis.  Her valve area is 0.96 cm, mean velocity 25 in peak velocity is 3.2 m/s.  She has highly symptomatic.  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  Also given the fact that she does have high risk factors for progression of her coronary artery disease I like the patient to undergo left heart catheterization.  While she is getting a heart catheterization hopefully will be able to go across her aortic valve to understand more about her aortic stenosis. The patient understands that risks include but are not limited to stroke (1 in 1000), death (1 in 24), kidney failure  (1 in 500), bleeding (1 in 200), allergic reaction [possibly serious] (1 in 200), and agrees to proceed.  In the meantime we will also set her up to see our structural colleagues.   Her blood pressure is slightly elevated but she did not take all of her medication this morning especially including her Lasix.  For now continue patient on her aspirin and statin for coronary artery disease. Blood work will be done today for BMP, mag and also BNP to assess for any subclinical heart failure.  The patient is in agreement with the above plan. The patient left the office in stable condition.  The patient will follow up in   Medication Adjustments/Labs and Tests Ordered: Current medicines are reviewed at length with the patient today.  Concerns regarding medicines are outlined above.  Orders Placed This Encounter  Procedures  . Basic  metabolic panel  . CBC  . Magnesium  . Pro b natriuretic peptide (BNP)  . Ambulatory referral to Structural Heart/Valve Clinic (only at [place])  . EKG 12-Lead   No orders of the defined types were placed in this encounter.   Patient Instructions  Medication Instructions:  Your physician recommends that you continue on your current medications as directed. Please refer to the Current Medication list given to you today.  *If you need a refill on your cardiac medications before your next appointment, please call your pharmacy*   Lab Work: Your physician recommends that you return for lab work in today: bmp, cbc, mg, pro bnp   If you have labs (blood work) drawn today and your tests are completely normal, you will receive your results only by: Marland Kitchen MyChart Message (if you have MyChart) OR . A paper copy in the mail If you have any lab test that is abnormal or we need to change your treatment, we will call you to review the results.   Testing/Procedures:    [place] [address] Dept: [phone] Loc: [place]  07/24/2020  You are scheduled for a Cardiac Catheterization on Thursday, November 18 with Dr. Sherren Mocha.  1. Please arrive at the [facility] (Main Entrance A) at [facility]: [address] [place], [place] [address] at 7:00 AM (This time is two hours before your procedure to ensure your preparation). Free valet parking service is available.   Special note: Every effort is made to have your procedure done on time. Please understand that emergencies sometimes delay scheduled procedures.  2. Diet: Do not eat solid foods after midnight.  The patient may have clear liquids until 5am upon  the day of the procedure.  3. Labs: You will need to have blood drawn today.  4. Medication instructions in preparation for your procedure:    Contrast Allergy: No    Stop taking Eliquis (Apixiban) on Tuesday, November 16.  HOLD Lasix the day of procedure   HOLD Chlorthalidone the day of procedure   Do not take Diabetes Med Glucophage (Metformin) on the day of the procedure and HOLD 48 HOURS AFTER THE PROCEDURE.  On the morning of your procedure, take your Aspirin and any morning medicines NOT listed above.  You may use sips of water.  5. Plan for one night stay--bring personal belongings. 6. Bring a current list of your medications and current insurance cards. 7. You MUST have a responsible person to drive you home. 8. Someone MUST be with you the first 24 hours after you arrive home or your discharge will be delayed. 9. Please wear clothes that are easy to get on and off and wear slip-on shoes.  Thank you for allowing [place] to care for you!   -- [place] [facility]    Follow-Up: At [facility], you and your health needs are our priority.  As part of our continuing mission to provide you with exceptional heart care, we have created designated Provider Care Teams.  These Care Teams include your primary Cardiologist (physician) and Advanced Practice Providers (APPs -  Physician Assistants and Nurse Practitioners) who all work together to provide you with the care you need, when you need it.  We recommend signing up for the patient portal called "MyChart".  Sign up information is provided on this After Visit Summary.  MyChart is used to connect with patients for Virtual Visits (Telemedicine).  Patients are able to view lab/test results, encounter notes, upcoming appointments, etc.  Non-urgent messages can be sent to your provider as well.   To learn more about what you can do with MyChart, go to [email or web address].    Your next appointment:   2 week(s)  The format for your next appointment:   In Person  Provider:   Berniece Salines, DO   Other Instructions   Coronary Angiogram With  Stent Coronary angiogram with stent placement is a procedure to widen or open a narrow blood vessel of the heart (coronary artery). Arteries may become blocked by cholesterol buildup (plaques) in the lining of the artery wall. When a coronary artery becomes partially blocked, blood flow to that area decreases. This may lead to chest pain or a heart attack (myocardial infarction). A stent is a small piece of metal that looks like mesh or spring. Stent placement may be done as treatment after a heart attack, or to prevent a heart attack if a blocked artery is found by a coronary angiogram. Let your health care provider know about:  Any allergies you have, including allergies to medicines or contrast dye.  All medicines you are taking, including vitamins, herbs, eye drops, creams, and over-the-counter medicines.  Any problems you or family members have had with anesthetic medicines.  Any blood disorders you have.  Any surgeries you have had.  Any medical conditions you have, including kidney problems or kidney failure.  Whether you are pregnant or may be pregnant.  Whether you are breastfeeding. What are the risks? Generally, this is a safe procedure. However, serious problems may occur, including:  Damage to nearby structures or organs, such as the heart, blood vessels, or kidneys.  A return of blockage.  Bleeding, infection, or  bruising at the insertion site.  A collection of blood under the skin (hematoma) at the insertion site.  A blood clot in another part of the body.  Allergic reaction to medicines or dyes.  Bleeding into the abdomen (retroperitoneal bleeding).  Stroke (rare).  Heart attack (rare). What happens before the procedure? Staying hydrated Follow instructions from your health care provider about hydration, which may include:  Up to 2 hours before the procedure - you may continue to drink clear liquids, such as water, clear fruit juice, black coffee, and plain  tea.  Eating and drinking restrictions Follow instructions from your health care provider about eating and drinking, which may include:  8 hours before the procedure - stop eating heavy meals or foods, such as meat, fried foods, or fatty foods.  6 hours before the procedure - stop eating light meals or foods, such as toast or cereal.  2 hours before the procedure - stop drinking clear liquids. Medicines Ask your health care provider about:  Changing or stopping your regular medicines. This is especially important if you are taking diabetes medicines or blood thinners.  Taking medicines such as aspirin and ibuprofen. These medicines can thin your blood. Do not take these medicines unless your health care provider tells you to take them. ? Generally, aspirin is recommended before a thin tube, called a catheter, is passed through a blood vessel and inserted into the heart (cardiac catheterization).  Taking over-the-counter medicines, vitamins, herbs, and supplements. General instructions  Do not use any products that contain nicotine or tobacco for at least 4 weeks before the procedure. These products include cigarettes, e-cigarettes, and chewing tobacco. If you need help quitting, ask your health care provider.  Plan to have someone take you home from the hospital or clinic.  If you will be going home right after the procedure, plan to have someone with you for 24 hours.  You may have tests and imaging procedures.  Ask your health care provider: ? How your insertion site will be marked. Ask which artery will be used for the procedure. ? What steps will be taken to help prevent infection. These may include:  Removing hair at the insertion site.  Washing skin with a germ-killing soap.  Taking antibiotic medicine. What happens during the procedure?   An IV will be inserted into one of your veins.  Electrodes may be placed on your chest to monitor your heart rate during the  procedure.  You will be given one or more of the following: ? A medicine to help you relax (sedative). ? A medicine to numb the area (local anesthetic) for catheter insertion.  A small incision will be made for catheter insertion.  The catheter will be inserted into an artery using a guide wire. The location may be in your groin, your wrist, or the fold of your arm (near your elbow).  An X-ray procedure (fluoroscopy) will be used to help guide the catheter to the opening of the heart arteries.  A dye will be injected into the catheter. X-rays will be taken. The dye helps to show where any narrowing or blockages are located in the arteries.  Tell your health care provider if you have chest pain or trouble breathing.  A tiny wire will be guided to the blocked spot, and a balloon will be inflated to make the artery wider.  The stent will be expanded to crush the plaques into the wall of the vessel. The stent will hold the area  open and improve the blood flow. Most stents have a drug coating to reduce the risk of the stent narrowing over time.  The artery may be made wider using a drill, laser, or other tools that remove plaques.  The catheter will be removed when the blood flow improves. The stent will stay where it was placed, and the lining of the artery will grow over it.  A bandage (dressing) will be placed on the insertion site. Pressure will be applied to stop bleeding.  The IV will be removed. This procedure may vary among health care providers and hospitals. What happens after the procedure?  Your blood pressure, heart rate, breathing rate, and blood oxygen level will be monitored until you leave the hospital or clinic.  If the procedure is done through the leg, you will lie flat in bed for a few hours or for as long as told by your health care provider. You will be instructed not to bend or cross your legs.  The insertion site and the pulse in your foot or wrist will be  checked often.  You may have more blood tests, X-rays, and a test that records the electrical activity of your heart (electrocardiogram, or ECG).  Do not drive for 24 hours if you were given a sedative during your procedure. Summary  Coronary angiogram with stent placement is a procedure to widen or open a narrowed coronary artery. This is done to treat heart problems.  Before the procedure, let your health care provider know about all the medical conditions and surgeries you have or have had.  This is a safe procedure. However, some problems may occur, including damage to nearby structures or organs, bleeding, blood clots, or allergies.  Follow your health care provider's instructions about eating, drinking, medicines, and other lifestyle changes, such as quitting tobacco use before the procedure. This information is not intended to replace advice given to you by your health care provider. Make sure you discuss any questions you have with your health care provider. Document Revised: 03/28/2019 Document Reviewed: 03/28/2019 Elsevier Patient Education  [place].      Adopting a Healthy Lifestyle.  Know what a healthy weight is for you (roughly BMI <25) and aim to maintain this   Aim for 7+ servings of fruits and vegetables daily   65-80+ fluid ounces of water or unsweet tea for healthy kidneys   Limit to max 1 drink of alcohol per day; avoid smoking/tobacco   Limit animal fats in diet for cholesterol and heart health - choose grass fed whenever available   Avoid highly processed foods, and foods high in saturated/trans fats   Aim for low stress - take time to unwind and care for your mental health   Aim for 150 min of moderate intensity exercise weekly for heart health, and weights twice weekly for bone health   Aim for 7-9 hours of sleep daily   When it comes to diets, agreement about the perfect plan isnt easy to find, even among the experts. Experts at the  [employer] developed an idea known as the Healthy Eating Plate. Just imagine a plate divided into logical, healthy portions.   The emphasis is on diet quality:   Load up on vegetables and fruits - one-half of your plate: Aim for color and variety, and remember that potatoes dont count.   Go for whole grains - one-quarter of your plate: Whole wheat, barley, wheat berries, quinoa, oats, brown rice, and  foods made with them. If you want pasta, go with whole wheat pasta.   Protein power - one-quarter of your plate: Fish, chicken, beans, and nuts are all healthy, versatile protein sources. Limit red meat.   The diet, however, does go beyond the plate, offering a few other suggestions.   Use healthy plant oils, such as olive, canola, soy, corn, sunflower and peanut. Check the labels, and avoid partially hydrogenated oil, which have unhealthy trans fats.   If youre thirsty, drink water. Coffee and tea are good in moderation, but skip sugary drinks and limit milk and dairy products to one or two daily servings.   The type of carbohydrate in the diet is more important than the amount. Some sources of carbohydrates, such as vegetables, fruits, whole grains, and beans-are healthier than others.   Finally, stay active  Signed, Berniece Salines, DO  07/24/2020 11:14 AM    [place]

## 2020-07-24 NOTE — H&P (View-Only) (Signed)
Cardiology Office Note:    Date:  07/24/2020   ID:  Julie Leblanc, DOB [birth year], MRN [id number]  PCP:  Mateo Flow, MD  Cardiologist:  Berniece Salines, DO  Electrophysiologist:  None   Referring MD: Mateo Flow, MD   " I am doing well"  History of Present Illness:    Julie Leblanc is a 74 y.o. female with a hx of coronary artery disease, post CABG in 2007, mild aortic stenosis, diabetes mellitus, dyslipidemia, hypertension follow is here today for follow-up visit. Patient tells me that she has been experiencing significant shortness of breath on exertion.  Is getting worse.  At her last visit we got a repeat echocardiogram to assess her valvular disease.  She is here today to discuss the results.  Past Medical History:  Diagnosis Date  . Abnormal nuclear stress test 05/10/2014  . Aortic stenosis, mild 10/2012   mild AS by Echo , mild MR  . Aortic valve stenosis 10/05/2013  . CAD (coronary artery disease)    hx CABG 2007, LAST NUC EF 70%-no ischemia  . Cellulitis of both lower extremities 06/17/2014  . Chest pain 05/10/2014  . Coronary artery disease involving coronary bypass graft of native heart without angina pectoris 02/09/2018  . Dark stools 03/27/2013  . Diabetes mellitus ([place])   . Dizzy 03/27/2013  . DM2 (diabetes mellitus, type 2) ([place]) 10/05/2013  . Dyslipidemia 10/05/2013  . Essential hypertension 05/10/2014  . GERD (gastroesophageal reflux disease)   . Hyperlipemia   . Hypertension   . Knee joint pain 12/21/2012  . Low back pain 12/21/2012  . Neuropathy   . Obesity, Class I, BMI 30.0-34.9 (see actual BMI) 06/14/2014  . Preoperative cardiovascular examination 04/05/2014  . S/P CABG x 3 10/05/2013   2007 - Dr. Lucianne Lei Trigt LIMA to LAD, SVG to diagonal and SVG to distal RCA     Past Surgical History:  Procedure Laterality Date  . ABDOMINAL HYSTERECTOMY  1990  . CORONARY ARTERY BYPASS GRAFT  01/24/2006   x 3,LIMA-LAD, VG-diag, VG-distal RCA  . GALLBLADDER SURGERY  1990's    . knee replacment  1990's  . LEFT HEART CATHETERIZATION WITH CORONARY/GRAFT ANGIOGRAM N/A 05/17/2014   Procedure: LEFT HEART CATHETERIZATION WITH Beatrix Fetters;  Surgeon: Jettie Booze, MD;  Location: [facility] CATH LAB;  Service: Cardiovascular;  Laterality: N/A;  . lung repair     s/p lap chole    Current Medications: Current Meds  Medication Sig  . acetaminophen (TYLENOL) 650 MG CR tablet Take 1,300 mg by mouth daily as needed for pain.  Marland Kitchen ALPRAZolam (XANAX) 1 MG tablet Take 1 mg by mouth 2 (two) times daily as needed for anxiety.   Marland Kitchen apixaban (ELIQUIS) 5 MG TABS tablet Take 5 mg by mouth 2 (two) times daily.  Marland Kitchen aspirin EC 81 MG tablet Take 81 mg by mouth daily. Swallow whole.  Marland Kitchen atorvastatin (LIPITOR) 40 MG tablet Take 1 tablet by mouth daily.  . bisacodyl (DULCOLAX) 5 MG EC tablet Take 5 mg by mouth daily as needed for moderate constipation.  . celecoxib (CELEBREX) 200 MG capsule Take 1 capsule by mouth daily.  . chlorthalidone (HYGROTON) 25 MG tablet Take 12.5 mg by mouth daily.  . ciprofloxacin (CIPRO) 500 MG tablet Take 1 tablet (500 mg total) by mouth 2 (two) times daily.  Marland Kitchen diltiazem (CARDIZEM SR) 120 MG 12 hr capsule Take 1 capsule (120 mg total) by mouth 2 (two) times daily.  . diphenhydramine-acetaminophen (TYLENOL PM)  25-500 MG TABS Take 1 tablet by mouth at bedtime as needed (for sleep).  . EPINEPHrine (EPI-PEN) 0.3 mg/0.3 mL SOAJ injection Inject 0.3 mg into the muscle once as needed (for peanut allergy).   . fenofibrate (TRICOR) 145 MG tablet Take 1 tablet (145 mg total) by mouth daily.  Marland Kitchen FLUZONE HIGH-DOSE QUADRIVALENT 0.7 ML SUSY   . furosemide (LASIX) 40 MG tablet Take 40 mg by mouth daily.  Marland Kitchen gabapentin (NEURONTIN) 300 MG capsule Take 600 mg by mouth 3 (three) times daily.   Marland Kitchen icosapent Ethyl (VASCEPA) 1 g capsule Take 2 g by mouth 2 (two) times daily.  . irbesartan (AVAPRO) 300 MG tablet Take 1 tablet (300 mg total) by mouth daily.  . Loratadine 10 MG CAPS  Take 1 tablet by mouth daily.  . metFORMIN (GLUCOPHAGE-XR) 750 MG 24 hr tablet Take 1,500 mg by mouth daily with breakfast.  . metoprolol (LOPRESSOR) 50 MG tablet Take 50 mg by mouth 3 (three) times daily.  . Multiple Vitamin (MULTIVITAMIN WITH MINERALS) TABS Take 1 tablet by mouth daily.  . nitroGLYCERIN (NITROSTAT) 0.4 MG SL tablet Place 1 tablet (0.4 mg total) under the tongue every 5 (five) minutes as needed for chest pain.  . pantoprazole (PROTONIX) 40 MG tablet Take 40 mg by mouth daily.   Marland Kitchen tiZANidine (ZANAFLEX) 4 MG tablet Take 2-4 mg by mouth daily as needed for muscle spasms.   . traZODone (DESYREL) 100 MG tablet Take 100 mg by mouth at bedtime as needed.  . venlafaxine XR (EFFEXOR-XR) 75 MG 24 hr capsule Take 1 capsule by mouth daily.     Allergies:   Other, Peanut oil, and Penicillins   Social History   Socioeconomic History  . Marital status: Widowed    Spouse name: Not on file  . Number of children: Not on file  . Years of education: Not on file  . Highest education level: Not on file  Occupational History  . Not on file  Tobacco Use  . Smoking status: Never Smoker  . Smokeless tobacco: Never Used  Substance and Sexual Activity  . Alcohol use: No  . Drug use: No  . Sexual activity: Not on file  Other Topics Concern  . Not on file  Social History Narrative  . Not on file   Social Determinants of Health   Financial Resource Strain:   . Difficulty of Paying Living Expenses: Not on file  Food Insecurity:   . Worried About Charity fundraiser in the Last Year: Not on file  . Ran Out of Food in the Last Year: Not on file  Transportation Needs:   . Lack of Transportation (Medical): Not on file  . Lack of Transportation (Non-Medical): Not on file  Physical Activity:   . Days of Exercise per Week: Not on file  . Minutes of Exercise per Session: Not on file  Stress:   . Feeling of Stress : Not on file  Social Connections:   . Frequency of Communication with  Friends and Family: Not on file  . Frequency of Social Gatherings with Friends and Family: Not on file  . Attends Religious Services: Not on file  . Active Member of Clubs or Organizations: Not on file  . Attends Archivist Meetings: Not on file  . Marital Status: Not on file     Family History: The patient's family history includes Diabetes in her brother and mother; Diabetes (age of onset: 71) in her father; Heart disease (  age of onset: 86) in her brother; Heart disease (age of onset: 64) in her mother.  ROS:   Review of Systems  Constitution: Negative for decreased appetite, fever and weight gain.  HENT: Negative for congestion, ear discharge, hoarse voice and sore throat.   Eyes: Negative for discharge, redness, vision loss in right eye and visual halos.  Cardiovascular: Negative for chest pain, dyspnea on exertion, leg swelling, orthopnea and palpitations.  Respiratory: Negative for cough, hemoptysis, shortness of breath and snoring.   Endocrine: Negative for heat intolerance and polyphagia.  Hematologic/Lymphatic: Negative for bleeding problem. Does not bruise/bleed easily.  Skin: Negative for flushing, nail changes, rash and suspicious lesions.  Musculoskeletal: Negative for arthritis, joint pain, muscle cramps, myalgias, neck pain and stiffness.  Gastrointestinal: Negative for abdominal pain, bowel incontinence, diarrhea and excessive appetite.  Genitourinary: Negative for decreased libido, genital sores and incomplete emptying.  Neurological: Negative for brief paralysis, focal weakness, headaches and loss of balance.  Psychiatric/Behavioral: Negative for altered mental status, depression and suicidal ideas.  Allergic/Immunologic: Negative for HIV exposure and persistent infections.    EKGs/Labs/Other Studies Reviewed:    The following studies were reviewed today:   EKG:  The ekg ordered today demonstrates simus rhythm 71 bpm, first-degree AV block  TTE  IMPRESSIONS  1. Left ventricular ejection fraction, by estimation, is 60 to 65%. The left ventricle has normal function. The left ventricle has no regional  wall motion abnormalities. Left ventricular diastolic parameters are consistent with Grade II diastolic  dysfunction (pseudonormalization).  2. Right ventricular systolic function is normal. The right ventricular size is normal. There is moderately elevated pulmonary artery systolic pressure.  3. Left atrial size was severely dilated.  4. The mitral valve is normal in structure. No evidence of mitral valve regurgitation. No evidence of mitral stenosis.  5. The aortic valve is normal in structure. There is moderate calcification of the aortic valve. There is moderate thickening of the aortic valve. Aortic valve regurgitation is not visualized. Moderate  aortic valve stenosis. Aortic valve mean gradient  measures 25.3 mmHg.  6. The inferior vena cava is normal in size with greater than 50%  respiratory variability, suggesting right atrial pressure of 3 mmHg.   Recent Labs: No results found for requested labs within last 8760 hours.  Recent Lipid Panel No results found for: CHOL, TRIG, HDL, CHOLHDL, VLDL, LDLCALC, LDLDIRECT  Physical Exam:    VS:  BP (!) 142/66   Pulse 80   Ht 5\' 3"  (1.6 m)   Wt 196 lb 12.8 oz (89.3 kg)   SpO2 95%   BMI 34.86 kg/m     Wt Readings from Last 3 Encounters:  07/24/20 196 lb 12.8 oz (89.3 kg)  06/16/20 199 lb 9.6 oz (90.5 kg)  07/09/19 201 lb 12.8 oz (91.5 kg)     GEN: Well nourished, well developed in no acute distress HEENT: Normal NECK: No JVD; No carotid bruits LYMPHATICS: No lymphadenopathy CARDIAC: S1S2 noted,RRR, no murmurs, rubs, gallops RESPIRATORY:  Clear to auscultation without rales, wheezing or rhonchi  ABDOMEN: Soft, non-tender, non-distended, +bowel sounds, no guarding. EXTREMITIES: No edema, No cyanosis, no clubbing MUSCULOSKELETAL:  No deformity  SKIN: Warm and  dry NEUROLOGIC:  Alert and oriented x 3, non-focal PSYCHIATRIC:  Normal affect, good insight  ASSESSMENT:    1. Coronary artery disease involving coronary bypass graft of native heart without angina pectoris   2. Nonrheumatic aortic valve stenosis   3. Dyspnea on exertion   4. Chronic diastolic  heart failure (HCC)    PLAN:     Also able to discuss the patient testing results with her today her echocardiogram showed evidence of worsening aortic stenosis.  Her valve area is 0.96 cm, mean velocity 25 in peak velocity is 3.2 m/s.  She has highly symptomatic.  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  Also given the fact that she does have high risk factors for progression of her coronary artery disease I like the patient to undergo left heart catheterization.  While she is getting a heart catheterization hopefully will be able to go across her aortic valve to understand more about her aortic stenosis. The patient understands that risks include but are not limited to stroke (1 in 1000), death (1 in 50), kidney failure  (1 in 500), bleeding (1 in 200), allergic reaction [possibly serious] (1 in 200), and agrees to proceed.  In the meantime we will also set her up to see our structural colleagues.   Her blood pressure is slightly elevated but she did not take all of her medication this morning especially including her Lasix.  For now continue patient on her aspirin and statin for coronary artery disease. Blood work will be done today for BMP, mag and also BNP to assess for any subclinical heart failure.  The patient is in agreement with the above plan. The patient left the office in stable condition.  The patient will follow up in   Medication Adjustments/Labs and Tests Ordered: Current medicines are reviewed at length with the patient today.  Concerns regarding medicines are outlined above.  Orders Placed This Encounter  Procedures  . Basic  metabolic panel  . CBC  . Magnesium  . Pro b natriuretic peptide (BNP)  . Ambulatory referral to Structural Heart/Valve Clinic (only at [place])  . EKG 12-Lead   No orders of the defined types were placed in this encounter.   Patient Instructions  Medication Instructions:  Your physician recommends that you continue on your current medications as directed. Please refer to the Current Medication list given to you today.  *If you need a refill on your cardiac medications before your next appointment, please call your pharmacy*   Lab Work: Your physician recommends that you return for lab work in today: bmp, cbc, mg, pro bnp   If you have labs (blood work) drawn today and your tests are completely normal, you will receive your results only by: Marland Kitchen MyChart Message (if you have MyChart) OR . A paper copy in the mail If you have any lab test that is abnormal or we need to change your treatment, we will call you to review the results.   Testing/Procedures:    [place] [id number] Dept: [phone] Loc: [place]  07/24/2020  You are scheduled for a Cardiac Catheterization on Thursday, November 18 with Dr. Sherren Mocha.  1. Please arrive at the [facility] (Main Entrance A) at [facility]: [address] [place], [place] [address] at 7:00 AM (This time is two hours before your procedure to ensure your preparation). Free valet parking service is available.   Special note: Every effort is made to have your procedure done on time. Please understand that emergencies sometimes delay scheduled procedures.  2. Diet: Do not eat solid foods after midnight.  The patient may have clear liquids until 5am  upon the day of the procedure.  3. Labs: You will need to have blood drawn today.  4. Medication instructions in preparation for your procedure:    Contrast Allergy: No    Stop taking Eliquis (Apixiban) on Tuesday, November 16.  HOLD Lasix the day of procedure   HOLD Chlorthalidone the day of procedure   Do not take Diabetes Med Glucophage (Metformin) on the day of the procedure and HOLD 48 HOURS AFTER THE PROCEDURE.  On the morning of your procedure, take your Aspirin and any morning medicines NOT listed above.  You may use sips of water.  5. Plan for one night stay--bring personal belongings. 6. Bring a current list of your medications and current insurance cards. 7. You MUST have a responsible person to drive you home. 8. Someone MUST be with you the first 24 hours after you arrive home or your discharge will be delayed. 9. Please wear clothes that are easy to get on and off and wear slip-on shoes.  Thank you for allowing [place] to care for you!   -- [place]    Follow-Up: At [facility], you and your health needs are our priority.  As part of our continuing mission to provide you with exceptional heart care, we have created designated Provider Care Teams.  These Care Teams include your primary Cardiologist (physician) and Advanced Practice Providers (APPs -  Physician Assistants and Nurse Practitioners) who all work together to provide you with the care you need, when you need it.  We recommend signing up for the patient portal called "MyChart".  Sign up information is provided on this After Visit Summary.  MyChart is used to connect with patients for Virtual Visits (Telemedicine).  Patients are able to view lab/test results, encounter notes, upcoming appointments, etc.  Non-urgent messages can be sent to your provider as well.   To learn more about what you can do with MyChart, go to [email or web address].    Your next appointment:   2 week(s)  The format for your next appointment:   In Person  Provider:   Berniece Salines, DO   Other Instructions   Coronary Angiogram With  Stent Coronary angiogram with stent placement is a procedure to widen or open a narrow blood vessel of the heart (coronary artery). Arteries may become blocked by cholesterol buildup (plaques) in the lining of the artery wall. When a coronary artery becomes partially blocked, blood flow to that area decreases. This may lead to chest pain or a heart attack (myocardial infarction). A stent is a small piece of metal that looks like mesh or spring. Stent placement may be done as treatment after a heart attack, or to prevent a heart attack if a blocked artery is found by a coronary angiogram. Let your health care provider know about:  Any allergies you have, including allergies to medicines or contrast dye.  All medicines you are taking, including vitamins, herbs, eye drops, creams, and over-the-counter medicines.  Any problems you or family members have had with anesthetic medicines.  Any blood disorders you have.  Any surgeries you have had.  Any medical conditions you have, including kidney problems or kidney failure.  Whether you are pregnant or may be pregnant.  Whether you are breastfeeding. What are the risks? Generally, this is a safe procedure. However, serious problems may occur, including:  Damage to nearby structures or organs, such as the heart, blood vessels, or kidneys.  A return of blockage.  Bleeding, infection,  or bruising at the insertion site.  A collection of blood under the skin (hematoma) at the insertion site.  A blood clot in another part of the body.  Allergic reaction to medicines or dyes.  Bleeding into the abdomen (retroperitoneal bleeding).  Stroke (rare).  Heart attack (rare). What happens before the procedure? Staying hydrated Follow instructions from your health care provider about hydration, which may include:  Up to 2 hours before the procedure - you may continue to drink clear liquids, such as water, clear fruit juice, black coffee, and plain  tea.  Eating and drinking restrictions Follow instructions from your health care provider about eating and drinking, which may include:  8 hours before the procedure - stop eating heavy meals or foods, such as meat, fried foods, or fatty foods.  6 hours before the procedure - stop eating light meals or foods, such as toast or cereal.  2 hours before the procedure - stop drinking clear liquids. Medicines Ask your health care provider about:  Changing or stopping your regular medicines. This is especially important if you are taking diabetes medicines or blood thinners.  Taking medicines such as aspirin and ibuprofen. These medicines can thin your blood. Do not take these medicines unless your health care provider tells you to take them. ? Generally, aspirin is recommended before a thin tube, called a catheter, is passed through a blood vessel and inserted into the heart (cardiac catheterization).  Taking over-the-counter medicines, vitamins, herbs, and supplements. General instructions  Do not use any products that contain nicotine or tobacco for at least 4 weeks before the procedure. These products include cigarettes, e-cigarettes, and chewing tobacco. If you need help quitting, ask your health care provider.  Plan to have someone take you home from the hospital or clinic.  If you will be going home right after the procedure, plan to have someone with you for 24 hours.  You may have tests and imaging procedures.  Ask your health care provider: ? How your insertion site will be marked. Ask which artery will be used for the procedure. ? What steps will be taken to help prevent infection. These may include:  Removing hair at the insertion site.  Washing skin with a germ-killing soap.  Taking antibiotic medicine. What happens during the procedure?   An IV will be inserted into one of your veins.  Electrodes may be placed on your chest to monitor your heart rate during the  procedure.  You will be given one or more of the following: ? A medicine to help you relax (sedative). ? A medicine to numb the area (local anesthetic) for catheter insertion.  A small incision will be made for catheter insertion.  The catheter will be inserted into an artery using a guide wire. The location may be in your groin, your wrist, or the fold of your arm (near your elbow).  An X-ray procedure (fluoroscopy) will be used to help guide the catheter to the opening of the heart arteries.  A dye will be injected into the catheter. X-rays will be taken. The dye helps to show where any narrowing or blockages are located in the arteries.  Tell your health care provider if you have chest pain or trouble breathing.  A tiny wire will be guided to the blocked spot, and a balloon will be inflated to make the artery wider.  The stent will be expanded to crush the plaques into the wall of the vessel. The stent will hold the  area open and improve the blood flow. Most stents have a drug coating to reduce the risk of the stent narrowing over time.  The artery may be made wider using a drill, laser, or other tools that remove plaques.  The catheter will be removed when the blood flow improves. The stent will stay where it was placed, and the lining of the artery will grow over it.  A bandage (dressing) will be placed on the insertion site. Pressure will be applied to stop bleeding.  The IV will be removed. This procedure may vary among health care providers and hospitals. What happens after the procedure?  Your blood pressure, heart rate, breathing rate, and blood oxygen level will be monitored until you leave the hospital or clinic.  If the procedure is done through the leg, you will lie flat in bed for a few hours or for as long as told by your health care provider. You will be instructed not to bend or cross your legs.  The insertion site and the pulse in your foot or wrist will be  checked often.  You may have more blood tests, X-rays, and a test that records the electrical activity of your heart (electrocardiogram, or ECG).  Do not drive for 24 hours if you were given a sedative during your procedure. Summary  Coronary angiogram with stent placement is a procedure to widen or open a narrowed coronary artery. This is done to treat heart problems.  Before the procedure, let your health care provider know about all the medical conditions and surgeries you have or have had.  This is a safe procedure. However, some problems may occur, including damage to nearby structures or organs, bleeding, blood clots, or allergies.  Follow your health care provider's instructions about eating, drinking, medicines, and other lifestyle changes, such as quitting tobacco use before the procedure. This information is not intended to replace advice given to you by your health care provider. Make sure you discuss any questions you have with your health care provider. Document Revised: 03/28/2019 Document Reviewed: 03/28/2019 Elsevier Patient Education  [place].      Adopting a Healthy Lifestyle.  Know what a healthy weight is for you (roughly BMI <25) and aim to maintain this   Aim for 7+ servings of fruits and vegetables daily   65-80+ fluid ounces of water or unsweet tea for healthy kidneys   Limit to max 1 drink of alcohol per day; avoid smoking/tobacco   Limit animal fats in diet for cholesterol and heart health - choose grass fed whenever available   Avoid highly processed foods, and foods high in saturated/trans fats   Aim for low stress - take time to unwind and care for your mental health   Aim for 150 min of moderate intensity exercise weekly for heart health, and weights twice weekly for bone health   Aim for 7-9 hours of sleep daily   When it comes to diets, agreement about the perfect plan isnt easy to find, even among the experts. Experts at the  [employer] developed an idea known as the Healthy Eating Plate. Just imagine a plate divided into logical, healthy portions.   The emphasis is on diet quality:   Load up on vegetables and fruits - one-half of your plate: Aim for color and variety, and remember that potatoes dont count.   Go for whole grains - one-quarter of your plate: Whole wheat, barley, wheat berries, quinoa, oats, brown rice,  and foods made with them. If you want pasta, go with whole wheat pasta.   Protein power - one-quarter of your plate: Fish, chicken, beans, and nuts are all healthy, versatile protein sources. Limit red meat.   The diet, however, does go beyond the plate, offering a few other suggestions.   Use healthy plant oils, such as olive, canola, soy, corn, sunflower and peanut. Check the labels, and avoid partially hydrogenated oil, which have unhealthy trans fats.   If youre thirsty, drink water. Coffee and tea are good in moderation, but skip sugary drinks and limit milk and dairy products to one or two daily servings.   The type of carbohydrate in the diet is more important than the amount. Some sources of carbohydrates, such as vegetables, fruits, whole grains, and beans-are healthier than others.   Finally, stay active  Signed, Berniece Salines, DO  07/24/2020 11:14 AM    [place]

## 2020-07-24 NOTE — Patient Instructions (Signed)
Medication Instructions:  Your physician recommends that you continue on your current medications as directed. Please refer to the Current Medication list given to you today.  *If you need a refill on your cardiac medications before your next appointment, please call your pharmacy*   Lab Work: Your physician recommends that you return for lab work in today: bmp, cbc, mg, pro bnp   If you have labs (blood work) drawn today and your tests are completely normal, you will receive your results only by: Marland Kitchen MyChart Message (if you have MyChart) OR . A paper copy in the mail If you have any lab test that is abnormal or we need to change your treatment, we will call you to review the results.   Testing/Procedures:    [place] [id number] Dept: [phone] Loc: [place]  07/24/2020  You are scheduled for a Cardiac Catheterization on Thursday, November 18 with Dr. Sherren Mocha.  1. Please arrive at the [facility] (Main Entrance A) at [facility]: [address] [place], [place] [address] at 7:00 AM (This time is two hours before your procedure to ensure your preparation). Free valet parking service is available.   Special note: Every effort is made to have your procedure done on time. Please understand that emergencies sometimes delay scheduled procedures.  2. Diet: Do not eat solid foods after midnight.  The patient may have clear liquids until 5am upon the day of the procedure.  3. Labs: You will need to have blood drawn today.  4. Medication instructions in preparation for your procedure:   Contrast Allergy: No    Stop taking Eliquis (Apixiban) on Tuesday, November 16.  HOLD Lasix the day of procedure   HOLD Chlorthalidone the day of procedure   Do not take Diabetes Med Glucophage (Metformin) on the day of the procedure and HOLD 48 HOURS  AFTER THE PROCEDURE.  On the morning of your procedure, take your Aspirin and any morning medicines NOT listed above.  You may use sips of water.  5. Plan for one night stay--bring personal belongings. 6. Bring a current list of your medications and current insurance cards. 7. You MUST have a responsible person to drive you home. 8. Someone MUST be with you the first 24 hours after you arrive home or your discharge will be delayed. 9. Please wear clothes that are easy to get on and off and wear slip-on shoes.  Thank you for allowing [place] to care for you!   -- [facility]    Follow-Up: At [facility], you and your health needs are our priority.  As part of our continuing mission to provide you with exceptional heart care, we have created designated Provider Care Teams.  These Care Teams include your primary Cardiologist (physician) and Advanced Practice Providers (APPs -  Physician Assistants and Nurse Practitioners) who all work together to provide you with the care you need, when you need it.  We recommend signing up for the patient portal called "MyChart".  Sign up information is provided on this After Visit Summary.  MyChart is used to connect with patients for Virtual Visits (Telemedicine).  Patients are able to view lab/test results, encounter notes, upcoming appointments, etc.  Non-urgent messages can be sent to your provider as well.   To learn more about what you can do with MyChart, go to [email or web address].  Your next appointment:   2 week(s)  The format for your next appointment:   In Person  Provider:   Berniece Salines, DO   Other Instructions   Coronary Angiogram With Stent Coronary angiogram with stent placement is a procedure to widen or open a narrow blood vessel of the heart (coronary artery). Arteries may become blocked by cholesterol buildup (plaques) in the lining of the artery wall. When a coronary artery becomes partially  blocked, blood flow to that area decreases. This may lead to chest pain or a heart attack (myocardial infarction). A stent is a small piece of metal that looks like mesh or spring. Stent placement may be done as treatment after a heart attack, or to prevent a heart attack if a blocked artery is found by a coronary angiogram. Let your health care provider know about:  Any allergies you have, including allergies to medicines or contrast dye.  All medicines you are taking, including vitamins, herbs, eye drops, creams, and over-the-counter medicines.  Any problems you or family members have had with anesthetic medicines.  Any blood disorders you have.  Any surgeries you have had.  Any medical conditions you have, including kidney problems or kidney failure.  Whether you are pregnant or may be pregnant.  Whether you are breastfeeding. What are the risks? Generally, this is a safe procedure. However, serious problems may occur, including:  Damage to nearby structures or organs, such as the heart, blood vessels, or kidneys.  A return of blockage.  Bleeding, infection, or bruising at the insertion site.  A collection of blood under the skin (hematoma) at the insertion site.  A blood clot in another part of the body.  Allergic reaction to medicines or dyes.  Bleeding into the abdomen (retroperitoneal bleeding).  Stroke (rare).  Heart attack (rare). What happens before the procedure? Staying hydrated Follow instructions from your health care provider about hydration, which may include:  Up to 2 hours before the procedure - you may continue to drink clear liquids, such as water, clear fruit juice, black coffee, and plain tea.  Eating and drinking restrictions Follow instructions from your health care provider about eating and drinking, which may include:  8 hours before the procedure - stop eating heavy meals or foods, such as meat, fried foods, or fatty foods.  6 hours before  the procedure - stop eating light meals or foods, such as toast or cereal.  2 hours before the procedure - stop drinking clear liquids. Medicines Ask your health care provider about:  Changing or stopping your regular medicines. This is especially important if you are taking diabetes medicines or blood thinners.  Taking medicines such as aspirin and ibuprofen. These medicines can thin your blood. Do not take these medicines unless your health care provider tells you to take them. ? Generally, aspirin is recommended before a thin tube, called a catheter, is passed through a blood vessel and inserted into the heart (cardiac catheterization).  Taking over-the-counter medicines, vitamins, herbs, and supplements. General instructions  Do not use any products that contain nicotine or tobacco for at least 4 weeks before the procedure. These products include cigarettes, e-cigarettes, and chewing tobacco. If you need help quitting, ask your health care provider.  Plan to have someone take you home from the hospital or clinic.  If you will be going home right after the procedure, plan to have someone with you for 24 hours.  You may have tests and imaging procedures.  Ask your health  care provider: ? How your insertion site will be marked. Ask which artery will be used for the procedure. ? What steps will be taken to help prevent infection. These may include:  Removing hair at the insertion site.  Washing skin with a germ-killing soap.  Taking antibiotic medicine. What happens during the procedure?   An IV will be inserted into one of your veins.  Electrodes may be placed on your chest to monitor your heart rate during the procedure.  You will be given one or more of the following: ? A medicine to help you relax (sedative). ? A medicine to numb the area (local anesthetic) for catheter insertion.  A small incision will be made for catheter insertion.  The catheter will be inserted  into an artery using a guide wire. The location may be in your groin, your wrist, or the fold of your arm (near your elbow).  An X-ray procedure (fluoroscopy) will be used to help guide the catheter to the opening of the heart arteries.  A dye will be injected into the catheter. X-rays will be taken. The dye helps to show where any narrowing or blockages are located in the arteries.  Tell your health care provider if you have chest pain or trouble breathing.  A tiny wire will be guided to the blocked spot, and a balloon will be inflated to make the artery wider.  The stent will be expanded to crush the plaques into the wall of the vessel. The stent will hold the area open and improve the blood flow. Most stents have a drug coating to reduce the risk of the stent narrowing over time.  The artery may be made wider using a drill, laser, or other tools that remove plaques.  The catheter will be removed when the blood flow improves. The stent will stay where it was placed, and the lining of the artery will grow over it.  A bandage (dressing) will be placed on the insertion site. Pressure will be applied to stop bleeding.  The IV will be removed. This procedure may vary among health care providers and hospitals. What happens after the procedure?  Your blood pressure, heart rate, breathing rate, and blood oxygen level will be monitored until you leave the hospital or clinic.  If the procedure is done through the leg, you will lie flat in bed for a few hours or for as long as told by your health care provider. You will be instructed not to bend or cross your legs.  The insertion site and the pulse in your foot or wrist will be checked often.  You may have more blood tests, X-rays, and a test that records the electrical activity of your heart (electrocardiogram, or ECG).  Do not drive for 24 hours if you were given a sedative during your procedure. Summary  Coronary angiogram with stent  placement is a procedure to widen or open a narrowed coronary artery. This is done to treat heart problems.  Before the procedure, let your health care provider know about all the medical conditions and surgeries you have or have had.  This is a safe procedure. However, some problems may occur, including damage to nearby structures or organs, bleeding, blood clots, or allergies.  Follow your health care provider's instructions about eating, drinking, medicines, and other lifestyle changes, such as quitting tobacco use before the procedure. This information is not intended to replace advice given to you by your health care provider. Make sure you discuss  any questions you have with your health care provider. Document Revised: 03/28/2019 Document Reviewed: 03/28/2019 Elsevier Patient Education  [place].

## 2020-07-25 LAB — MAGNESIUM: Magnesium: 2 mg/dL (ref 1.6–2.3)

## 2020-07-25 LAB — BASIC METABOLIC PANEL
BUN/Creatinine Ratio: 21 (ref 12–28)
BUN: 21 mg/dL (ref 8–27)
CO2: 26 mmol/L (ref 20–29)
Calcium: 10.8 mg/dL — ABNORMAL HIGH (ref 8.7–10.3)
Chloride: 100 mmol/L (ref 96–106)
Creatinine, Ser: 0.98 mg/dL (ref 0.57–1.00)
GFR calc Af Amer: 66 mL/min/{1.73_m2} (ref >59)
GFR calc non Af Amer: 57 mL/min/{1.73_m2} — ABNORMAL LOW (ref >59)
Glucose: 125 mg/dL — ABNORMAL HIGH (ref 65–99)
Potassium: 4.6 mmol/L (ref 3.5–5.2)
Sodium: 142 mmol/L (ref 134–144)

## 2020-07-25 LAB — CBC
Hematocrit: 35.6 % (ref 34.0–46.6)
Hemoglobin: 11.8 g/dL (ref 11.1–15.9)
MCH: 31.4 pg (ref 26.6–33.0)
MCHC: 33.1 g/dL (ref 31.5–35.7)
MCV: 95 fL (ref 79–97)
Platelets: 243 10*3/uL (ref 150–450)
RBC: 3.76 x10E6/uL — ABNORMAL LOW (ref 3.77–5.28)
RDW: 13.1 % (ref 11.7–15.4)
WBC: 7 10*3/uL (ref 3.4–10.8)

## 2020-07-25 LAB — PRO B NATRIURETIC PEPTIDE: NT-Pro BNP: 450 pg/mL — ABNORMAL HIGH (ref 0–301)

## 2020-08-04 ENCOUNTER — Encounter: Payer: Self-pay | Admitting: Cardiothoracic Surgery

## 2020-08-04 ENCOUNTER — Other Ambulatory Visit (HOSPITAL_COMMUNITY)
Admission: RE | Admit: 2020-08-04 | Discharge: 2020-08-04 | Disposition: A | Discharge status: Home / Self Care | Payer: Medicare HMO | Source: Ambulatory Visit | Attending: Cardiovascular Disease | Admitting: Cardiovascular Disease

## 2020-08-04 DIAGNOSIS — Z20822 Contact with and (suspected) exposure to covid-19: Secondary | ICD-10-CM | POA: Diagnosis not present

## 2020-08-04 DIAGNOSIS — Z01812 Encounter for preprocedural laboratory examination: Secondary | ICD-10-CM | POA: Insufficient documentation

## 2020-08-04 LAB — SARS CORONAVIRUS 2 (TAT 6-24 HRS): SARS Coronavirus 2: NEGATIVE

## 2020-08-05 ENCOUNTER — Telehealth: Payer: Self-pay | Admitting: *Deleted

## 2020-08-05 NOTE — Telephone Encounter (Addendum)
Pt contacted pre-catheterization scheduled at [facility] for: Thursday August 07, 2020 9 AM Verified arrival time and place: [facility]) at: 7 AM   No solid food after midnight prior to cath, clear liquids until 5 AM day of procedure.  Hold: Metformin-day of procedure and 48 hours post procedure  Eliquis-none 08/05/20 until post procedure Lasix-day before and day of procedure-GFR 57 Chlorthalidone-day before and day of procedure-GFR 57 Irbesartan-day before and day of procedure-GFR 57  Except hold medications AM meds can be  taken pre-cath with sips of water including: ASA 81 mg   Confirmed patient has responsible adult to drive home post procedure and be with patient first 24 hours after arriving home: yes  You are allowed ONE visitor in the waiting room during the time you are at the hospital for your procedure. Both you and your visitor must wear a mask once you enter the hospital.       COVID-19 Pre-Screening Questions:  . In the past 14 days have you had a new cough, new headache, new nasal congestion, fever (100.4 or greater) unexplained body aches, new sore throat, or sudden loss of taste or sense of smell? no . In the past 14 days have you been around anyone with known Covid 19? no    Reviewed procedure/mask/visitor instructions, COVID-19 questions with patient.

## 2020-08-07 ENCOUNTER — Other Ambulatory Visit: Payer: Self-pay

## 2020-08-07 ENCOUNTER — Encounter (HOSPITAL_COMMUNITY)
Admission: RE | Disposition: A | Discharge status: Home / Self Care | Payer: Self-pay | Source: Home / Self Care | Attending: Cardiovascular Disease

## 2020-08-07 ENCOUNTER — Ambulatory Visit (HOSPITAL_COMMUNITY)
Admission: RE | Admit: 2020-08-07 | Discharge: 2020-08-07 | Disposition: A | Discharge status: Home / Self Care | Payer: Medicare HMO | Attending: Cardiovascular Disease | Admitting: Cardiovascular Disease

## 2020-08-07 ENCOUNTER — Encounter (HOSPITAL_COMMUNITY): Payer: Self-pay | Admitting: Cardiovascular Disease

## 2020-08-07 DIAGNOSIS — I350 Nonrheumatic aortic (valve) stenosis: Secondary | ICD-10-CM

## 2020-08-07 DIAGNOSIS — Z951 Presence of aortocoronary bypass graft: Secondary | ICD-10-CM | POA: Insufficient documentation

## 2020-08-07 DIAGNOSIS — R0609 Other forms of dyspnea: Secondary | ICD-10-CM | POA: Diagnosis not present

## 2020-08-07 DIAGNOSIS — I5032 Chronic diastolic (congestive) heart failure: Secondary | ICD-10-CM | POA: Diagnosis not present

## 2020-08-07 DIAGNOSIS — I110 Hypertensive heart disease with heart failure: Secondary | ICD-10-CM | POA: Insufficient documentation

## 2020-08-07 DIAGNOSIS — Z79899 Other long term (current) drug therapy: Secondary | ICD-10-CM | POA: Insufficient documentation

## 2020-08-07 DIAGNOSIS — I2510 Atherosclerotic heart disease of native coronary artery without angina pectoris: Secondary | ICD-10-CM | POA: Diagnosis not present

## 2020-08-07 DIAGNOSIS — E785 Hyperlipidemia, unspecified: Secondary | ICD-10-CM | POA: Diagnosis not present

## 2020-08-07 DIAGNOSIS — E119 Type 2 diabetes mellitus without complications: Secondary | ICD-10-CM | POA: Diagnosis not present

## 2020-08-07 DIAGNOSIS — Z7901 Long term (current) use of anticoagulants: Secondary | ICD-10-CM | POA: Diagnosis not present

## 2020-08-07 HISTORY — PX: RIGHT/LEFT HEART CATH AND CORONARY/GRAFT ANGIOGRAPHY: CATH118267

## 2020-08-07 LAB — POCT I-STAT EG7
Acid-Base Excess: 1 mmol/L (ref 0.0–2.0)
Bicarbonate: 29.3 mmol/L — ABNORMAL HIGH (ref 20.0–28.0)
Calcium, Ion: 1.31 mmol/L (ref 1.15–1.40)
HCT: 34 % — ABNORMAL LOW (ref 36.0–46.0)
Hemoglobin: 11.6 g/dL — ABNORMAL LOW (ref 12.0–15.0)
O2 Saturation: 64 %
Potassium: 4.2 mmol/L (ref 3.5–5.1)
Sodium: 144 mmol/L (ref 135–145)
TCO2: 31 mmol/L (ref 22–32)
pCO2, Ven: 61.5 mmHg — ABNORMAL HIGH (ref 44.0–60.0)
pH, Ven: 7.286 (ref 7.250–7.430)
pO2, Ven: 38 mmHg (ref 32.0–45.0)

## 2020-08-07 LAB — POCT I-STAT 7, (LYTES, BLD GAS, ICA,H+H)
Acid-Base Excess: 2 mmol/L (ref 0.0–2.0)
Bicarbonate: 29.6 mmol/L — ABNORMAL HIGH (ref 20.0–28.0)
Calcium, Ion: 1.33 mmol/L (ref 1.15–1.40)
HCT: 33 % — ABNORMAL LOW (ref 36.0–46.0)
Hemoglobin: 11.2 g/dL — ABNORMAL LOW (ref 12.0–15.0)
O2 Saturation: 97 %
Potassium: 4.3 mmol/L (ref 3.5–5.1)
Sodium: 142 mmol/L (ref 135–145)
TCO2: 31 mmol/L (ref 22–32)
pCO2 arterial: 58.9 mmHg — ABNORMAL HIGH (ref 32.0–48.0)
pH, Arterial: 7.309 — ABNORMAL LOW (ref 7.350–7.450)
pO2, Arterial: 100 mmHg (ref 83.0–108.0)

## 2020-08-07 LAB — GLUCOSE, CAPILLARY: Glucose-Capillary: 140 mg/dL — ABNORMAL HIGH (ref 70–99)

## 2020-08-07 SURGERY — RIGHT/LEFT HEART CATH AND CORONARY/GRAFT ANGIOGRAPHY
Anesthesia: LOCAL

## 2020-08-07 MED ORDER — MIDAZOLAM HCL 2 MG/2ML IJ SOLN
INTRAMUSCULAR | Status: AC
  Filled 2020-08-07: qty 2

## 2020-08-07 MED ORDER — ACETAMINOPHEN 325 MG PO TABS: 650.0000 mg | ORAL_TABLET | ORAL | Status: DC | PRN

## 2020-08-07 MED ORDER — SODIUM CHLORIDE 0.9% FLUSH: 3.0000 mL | INTRAVENOUS | Status: DC | PRN

## 2020-08-07 MED ORDER — FENTANYL CITRATE (PF) 100 MCG/2ML IJ SOLN
INTRAMUSCULAR | Status: AC
  Filled 2020-08-07: qty 2

## 2020-08-07 MED ORDER — HYDRALAZINE HCL 20 MG/ML IJ SOLN: 10.0000 mg | INTRAMUSCULAR | Status: DC | PRN

## 2020-08-07 MED ORDER — VERAPAMIL HCL 2.5 MG/ML IV SOLN
INTRAVENOUS | Status: DC | PRN
  Administered 2020-08-07: 10 mL via INTRA_ARTERIAL

## 2020-08-07 MED ORDER — SODIUM CHLORIDE 0.9 % WEIGHT BASED INFUSION
3.0000 mL/kg/h | INTRAVENOUS | Status: AC
  Administered 2020-08-07: 3 mL/kg/h via INTRAVENOUS

## 2020-08-07 MED ORDER — LIDOCAINE HCL (PF) 1 % IJ SOLN
INTRAMUSCULAR | Status: DC | PRN
  Administered 2020-08-07: 2 mL via SUBCUTANEOUS
  Administered 2020-08-07: 1 mL

## 2020-08-07 MED ORDER — SODIUM CHLORIDE 0.9% FLUSH: 3.0000 mL | Freq: Two times a day (BID) | INTRAVENOUS | Status: DC

## 2020-08-07 MED ORDER — HEPARIN (PORCINE) IN NACL 2000-0.9 UNIT/L-% IV SOLN
INTRAVENOUS | Status: AC
  Filled 2020-08-07: qty 1000

## 2020-08-07 MED ORDER — VERAPAMIL HCL 2.5 MG/ML IV SOLN
INTRAVENOUS | Status: AC
  Filled 2020-08-07: qty 2

## 2020-08-07 MED ORDER — ONDANSETRON HCL 4 MG/2ML IJ SOLN: 4.0000 mg | Freq: Four times a day (QID) | INTRAMUSCULAR | Status: DC | PRN

## 2020-08-07 MED ORDER — ASPIRIN 81 MG PO CHEW: 81.0000 mg | CHEWABLE_TABLET | ORAL | Status: DC

## 2020-08-07 MED ORDER — SODIUM CHLORIDE 0.9 % WEIGHT BASED INFUSION: 1.0000 mL/kg/h | INTRAVENOUS | Status: DC

## 2020-08-07 MED ORDER — FENTANYL CITRATE (PF) 100 MCG/2ML IJ SOLN
INTRAMUSCULAR | Status: DC | PRN
  Administered 2020-08-07 (×2): 25 ug via INTRAVENOUS

## 2020-08-07 MED ORDER — HEPARIN SODIUM (PORCINE) 1000 UNIT/ML IJ SOLN
INTRAMUSCULAR | Status: AC
  Filled 2020-08-07: qty 1

## 2020-08-07 MED ORDER — SODIUM CHLORIDE 0.9 % IV SOLN: 250.0000 mL | INTRAVENOUS | Status: DC | PRN

## 2020-08-07 MED ORDER — MIDAZOLAM HCL 2 MG/2ML IJ SOLN
INTRAMUSCULAR | Status: DC | PRN
  Administered 2020-08-07 (×2): 1 mg via INTRAVENOUS

## 2020-08-07 MED ORDER — IOHEXOL 350 MG/ML SOLN
INTRAVENOUS | Status: DC | PRN
  Administered 2020-08-07: 45 mL via INTRA_ARTERIAL

## 2020-08-07 MED ORDER — HEPARIN (PORCINE) IN NACL 1000-0.9 UT/500ML-% IV SOLN
INTRAVENOUS | Status: DC | PRN
  Administered 2020-08-07 (×2): 500 mL

## 2020-08-07 MED ORDER — HEPARIN SODIUM (PORCINE) 1000 UNIT/ML IJ SOLN
INTRAMUSCULAR | Status: DC | PRN
  Administered 2020-08-07: 4500 [IU] via INTRAVENOUS

## 2020-08-07 MED ORDER — LIDOCAINE HCL (PF) 1 % IJ SOLN
INTRAMUSCULAR | Status: AC
  Filled 2020-08-07: qty 30

## 2020-08-07 MED ORDER — LABETALOL HCL 5 MG/ML IV SOLN: 10.0000 mg | INTRAVENOUS | Status: DC | PRN

## 2020-08-07 SURGICAL SUPPLY — 12 items
CATH BALLN WEDGE 5F 110CM (CATHETERS) ×2 IMPLANT
CATH INFINITI 5FR AL1 (CATHETERS) ×2 IMPLANT
CATH INFINITI 5FR MULTPACK ANG (CATHETERS) ×2 IMPLANT
DEVICE RAD COMP TR BAND LRG (VASCULAR PRODUCTS) ×2 IMPLANT
GLIDESHEATH SLEND SS 6F .021 (SHEATH) ×2 IMPLANT
GUIDEWIRE INQWIRE 1.5J.035X260 (WIRE) ×1 IMPLANT
INQWIRE 1.5J .035X260CM (WIRE) ×2
KIT HEART LEFT (KITS) ×2 IMPLANT
PACK CARDIAC CATHETERIZATION (CUSTOM PROCEDURE TRAY) ×2 IMPLANT
SHEATH GLIDE SLENDER 4/5FR (SHEATH) ×2 IMPLANT
TRANSDUCER W/STOPCOCK (MISCELLANEOUS) ×2 IMPLANT
TUBING CIL FLEX 10 FLL-RA (TUBING) ×2 IMPLANT

## 2020-08-07 NOTE — Progress Notes (Signed)
Splint applied to left arm.

## 2020-08-07 NOTE — Discharge Instructions (Signed)

## 2020-08-07 NOTE — Research (Signed)
[place] Informed Consent   Subject Name: Julie Leblanc  Subject met inclusion and exclusion criteria.  The informed consent form, study requirements and expectations were reviewed with the subject and questions and concerns were addressed prior to the signing of the consent form.  The subject verbalized understanding of the trail requirements.  The subject agreed to participate in the [facility] trial and signed the informed consent.  The informed consent was obtained prior to performance of any protocol-specific procedures for the subject.  A copy of the signed informed consent was given to the subject and a copy was placed in the subject's medical record.  Philemon Kingdom D 08/07/2020, 7:56 AM

## 2020-08-07 NOTE — Interval H&P Note (Signed)
History and Physical Interval Note:  08/07/2020 9:26 AM  Julie Leblanc  has presented today for surgery, with the diagnosis of CAD.  The various methods of treatment have been discussed with the patient and family. After consideration of risks, benefits and other options for treatment, the patient has consented to  Procedure(s): RIGHT/LEFT HEART CATH AND CORONARY/GRAFT ANGIOGRAPHY (N/A) as a surgical intervention.  The patient's history has been reviewed, patient examined, no change in status, stable for surgery.  I have reviewed the patient's chart and labs.  Questions were answered to the patient's satisfaction.     Sherren Mocha

## 2020-08-07 NOTE — Progress Notes (Signed)
Discharge instructions reviewed with pt and [name] (boyfriend) via telephone. Both voice understanding.

## 2020-08-08 ENCOUNTER — Other Ambulatory Visit: Payer: Self-pay

## 2020-08-12 ENCOUNTER — Telehealth: Payer: Self-pay | Admitting: Licensed Clinical Social Worker

## 2020-08-12 ENCOUNTER — Ambulatory Visit: Payer: Medicare HMO | Admitting: Cardiology

## 2020-08-12 ENCOUNTER — Encounter: Payer: Self-pay | Admitting: Cardiology

## 2020-08-12 ENCOUNTER — Telehealth: Payer: Self-pay

## 2020-08-12 ENCOUNTER — Other Ambulatory Visit: Payer: Self-pay

## 2020-08-12 VITALS — BP 116/60 | HR 66 | Ht 63.0 in | Wt 197.4 lb

## 2020-08-12 DIAGNOSIS — R0600 Dyspnea, unspecified: Secondary | ICD-10-CM | POA: Diagnosis not present

## 2020-08-12 DIAGNOSIS — Z951 Presence of aortocoronary bypass graft: Secondary | ICD-10-CM | POA: Diagnosis not present

## 2020-08-12 DIAGNOSIS — I350 Nonrheumatic aortic (valve) stenosis: Secondary | ICD-10-CM | POA: Diagnosis not present

## 2020-08-12 DIAGNOSIS — I5032 Chronic diastolic (congestive) heart failure: Secondary | ICD-10-CM

## 2020-08-12 DIAGNOSIS — R0609 Other forms of dyspnea: Secondary | ICD-10-CM

## 2020-08-12 DIAGNOSIS — I10 Essential (primary) hypertension: Secondary | ICD-10-CM | POA: Diagnosis not present

## 2020-08-12 NOTE — Progress Notes (Signed)
Heart and Vascular Care Navigation  08/12/2020  Julie Leblanc [birth year] [id number]  Reason for Referral:  Cost of dental procedure effecting evaluation of further cardiology procedure.                                                                                                   Assessment:                                     CSW team received a message that pt had cancelled upcoming TAVR evaluations due to assumed costs of dental evaluation/procedures beforehand.   CSW called and spoke with pt via telephone at [phone]. Introduced self, role, reason for call. Pt is widowed and lives alone in [place]. Confirmed home address, PCP and current insurance coverage.   Pt has a large number of cousins locally but only keeps in touch with 3-5 of them. She has a significant other Julie Leblanc that she spends time w/ mostly on the weekends and assists if she needs something. She still drives and takes herself to appointments although she states she worries her vision may soon limit that. 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 Pt has a cell phone that she never uses and her house phone. She does have working internet at her home and is able to use a tablet/computer if needed.   She took a reverse mortgage out on her home due to costs a few years ago. She is able to access and afford her medications and food. She had previously applied for assistance programs through [employer] [place] but was told she made too much income.   When CSW inquired about her dental needs and cancelled evaluations. Pt explains that she has dental coverage through her [employer] but she thinks the copays will be too expensive and that she "doesn't know who around her is in network." She shares that she had a fall on her face in the past and ended up needed extensive dental work including a partial palate. She had been told by a dentist "a few years ago" that she needed to see the provider that did that  work but that that provider has since passed. With all this in mind she feels that she cannot financially afford what potentially it may cost and figured she'd just cancel the cardiology work up.   CSW explained I would reach out to Care Navigation team as a whole to see what next steps pt should take as it is important that she have further cardiology work up.   HRT/VAS Care Coordination    Patients Home Cardiology Office [place]   Living arrangements for the past 2 months Single Family Home   Lives with: Self   Patient Current Insurance Coverage Managed Medicare   Patient Has Concern With Paying Medical Bills Yes   Patient Concerns With Medical Bills dental costs   Medical Bill Referrals: list of in network providers sent to pt   Does Patient Have Prescription  Coverage? Yes   Home Assistive Devices/Equipment CBG Meter; Cane (specify quad or straight)      Social History:                                                                             SDOH Screenings   Alcohol Screen:   . Last Alcohol Screening Score (AUDIT): Not on file  Depression (PHQ2-9):   . PHQ-2 Score: Not on file  Financial Resource Strain: Medium Risk  . Difficulty of Paying Living Expenses: Somewhat hard  Food Insecurity: No Food Insecurity  . Worried About Charity fundraiser in the Last Year: Never true  . Ran Out of Food in the Last Year: Never true  Housing: Low Risk   . Last Housing Risk Score: 0  Physical Activity:   . Days of Exercise per Week: Not on file  . Minutes of Exercise per Session: Not on file  Social Connections: Unknown  . Frequency of Communication with Friends and Family: More than three times a week  . Frequency of Social Gatherings with Friends and Family: More than three times a week  . Attends Religious Services: Not on file  . Active Member of Clubs or Organizations: No  . Attends Archivist Meetings: Never  . Marital Status: Widowed  Stress:   . Feeling  of Stress : Not on file  Tobacco Use: Low Risk   . Smoking Tobacco Use: Never Smoker  . Smokeless Tobacco Use: Never Used  Transportation Needs: No Transportation Needs  . Lack of Transportation (Medical): No  . Lack of Transportation (Non-Medical): No    SDOH Interventions: Food Insecurity:  Food Insecurity Interventions: Intervention Not Indicated  Housing Insecurity:  Housing Interventions: Intervention Not Indicated  Transportation:   Transportation Interventions: Financial planner (offered to pt as pt mentioned her vision is becoming more limited)    Other Care Navigation Interventions:     Provided Pharmacy assistance resources  Pt expresses no major challenges w/ affording copays. Gets assistance w/ Eliquis from PCP office and her copays per her report are otherwise affordable.    Follow-up plan:   CSW called [employer] on behalf of pt and was able to find how to locate in network dental providers for pt online. Was able to get list and print. Will reach out to pt to see if she is willing to contact those providers to get an estimated cost that she can provide to [place]. Will see if there are any additional means to assist pt with associated costs.

## 2020-08-12 NOTE — Patient Instructions (Signed)

## 2020-08-12 NOTE — Telephone Encounter (Signed)
  HEART AND VASCULAR CENTER   MULTIDISCIPLINARY HEART VALVE TEAM   The pt contacted the [facility] to cancel all of her upcoming TAVR appointments.  I spoke with the pt and due to concerns about needing to see a dentist and the cost for dental procedures she has decided not to proceed with TAVR evaluation.  The pt has not seen a dentist in 8 years, she has partials and 14 natural teeth remaining.  The pt has [facility] and they do not cover dental cost and she is on a limited budget. At this time I will reach out to a Education officer, museum to see if any assistance can be provided to the pt to help with cost. I did recommend that the pt proceed with TAVR evaluation but the pt declines at this time.

## 2020-08-12 NOTE — Progress Notes (Signed)
Cardiology Office Note:    Date:  08/12/2020   ID:  Julie Leblanc, DOB Nov 11, 1945, MRN [id number]  PCP:  Mateo Flow, MD  Cardiologist:  Berniece Salines, DO  Electrophysiologist:  None   Referring MD: Mateo Flow, MD   " I am doing well"  History of Present Illness:    Julie Leblanc is a 74 y.o. female with a hx of severe aortic stenosis, coronary artery disease status post CABG in 2007.  She is status post recent heart catheterization all her grafts were noted to be patent with severe aortic stenosis.  The patient is here today for post cath visit.  She is still experiencing shortness of breath on exertion.  She is very sad today given the fact that she understands part of her tablet planning will need to involve dental visits.  She tells me she does not have any money to be able to go through with the dental visits.  She notes that her insurance have limited dental coverage and she is worried.  She is contemplating holding off however as a result of this.  Past Medical History:  Diagnosis Date  . Abnormal nuclear stress test 05/10/2014  . Aortic stenosis, mild 10/2012   mild AS by Echo , mild MR  . Aortic valve stenosis 10/05/2013  . CAD (coronary artery disease)    hx CABG 2007, LAST NUC EF 70%-no ischemia  . Cellulitis of both lower extremities 06/17/2014  . Chest pain 05/10/2014  . Coronary artery disease involving coronary bypass graft of native heart without angina pectoris 02/09/2018  . Dark stools 03/27/2013  . Diabetes mellitus ([place])   . Dizzy 03/27/2013  . DM2 (diabetes mellitus, type 2) ([place]) 10/05/2013  . Dyslipidemia 10/05/2013  . Essential hypertension 05/10/2014  . GERD (gastroesophageal reflux disease)   . Hyperlipemia   . Hypertension   . Knee joint pain 12/21/2012  . Low back pain 12/21/2012  . Neuropathy   . Obesity, Class I, BMI 30.0-34.9 (see actual BMI) 06/14/2014  . Preoperative cardiovascular examination 04/05/2014  . S/P CABG x 3 10/05/2013   2007 - Dr. Lucianne Lei  Trigt LIMA to LAD, SVG to diagonal and SVG to distal RCA     Past Surgical History:  Procedure Laterality Date  . ABDOMINAL HYSTERECTOMY  1990  . CORONARY ARTERY BYPASS GRAFT  01/24/2006   x 3,LIMA-LAD, VG-diag, VG-distal RCA  . GALLBLADDER SURGERY  1990's  . knee replacment  1990's  . LEFT HEART CATHETERIZATION WITH CORONARY/GRAFT ANGIOGRAM N/A 05/17/2014   Procedure: LEFT HEART CATHETERIZATION WITH Beatrix Fetters;  Surgeon: Jettie Booze, MD;  Location: [facility] CATH LAB;  Service: Cardiovascular;  Laterality: N/A;  . lung repair     s/p lap chole  . RIGHT/LEFT HEART CATH AND CORONARY/GRAFT ANGIOGRAPHY N/A 08/07/2020   Procedure: RIGHT/LEFT HEART CATH AND CORONARY/GRAFT ANGIOGRAPHY;  Surgeon: Sherren Mocha, MD;  Location: [facility];  Service: Cardiovascular;  Laterality: N/A;    Current Medications: Current Meds  Medication Sig  . acetaminophen (TYLENOL) 650 MG CR tablet Take 1,300 mg by mouth in the morning and at bedtime.   . ALPRAZolam (XANAX) 1 MG tablet Take 1 mg by mouth at bedtime.   Marland Kitchen apixaban (ELIQUIS) 5 MG TABS tablet Take 5 mg by mouth 2 (two) times daily.  Marland Kitchen aspirin EC 81 MG tablet Take 81 mg by mouth daily. Swallow whole.  Marland Kitchen atorvastatin (LIPITOR) 40 MG tablet Take 40 mg by mouth daily.   Marland Kitchen  bisacodyl (DULCOLAX) 5 MG EC tablet Take 5 mg by mouth daily as needed for moderate constipation.  . chlorthalidone (HYGROTON) 25 MG tablet Take 12.5 mg by mouth daily.  Marland Kitchen diltiazem (CARDIZEM SR) 120 MG 12 hr capsule Take 1 capsule (120 mg total) by mouth 2 (two) times daily.  . diphenhydramine-acetaminophen (TYLENOL PM) 25-500 MG TABS Take 1 tablet by mouth at bedtime.   Marland Kitchen EPINEPHrine (EPI-PEN) 0.3 mg/0.3 mL SOAJ injection Inject 0.3 mg into the muscle once as needed for anaphylaxis (for peanut allergy).   . fenofibrate (TRICOR) 145 MG tablet Take 1 tablet (145 mg total) by mouth daily.  . furosemide (LASIX) 40 MG tablet Take 40 mg by mouth daily.  Marland Kitchen gabapentin  (NEURONTIN) 300 MG capsule Take 600 mg by mouth 3 (three) times daily.   Marland Kitchen gabapentin (NEURONTIN) 400 MG capsule Take 400 mg by mouth.  . icosapent Ethyl (VASCEPA) 1 g capsule Take 2 g by mouth 2 (two) times daily.  . irbesartan (AVAPRO) 300 MG tablet Take 1 tablet (300 mg total) by mouth daily.  . Loratadine 10 MG CAPS Take 10 mg by mouth daily.   . metFORMIN (GLUCOPHAGE-XR) 750 MG 24 hr tablet Take 750 mg by mouth in the morning and at bedtime.   . metoprolol (LOPRESSOR) 50 MG tablet Take 50 mg by mouth 3 (three) times daily.  . Multiple Vitamin (MULTIVITAMIN WITH MINERALS) TABS Take 1 tablet by mouth daily.  . nitroGLYCERIN (NITROSTAT) 0.4 MG SL tablet Place 1 tablet (0.4 mg total) under the tongue every 5 (five) minutes as needed for chest pain.  . Omega-3 1000 MG CAPS Take 1,000 mg by mouth daily.  . pantoprazole (PROTONIX) 40 MG tablet Take 40 mg by mouth daily.   Marland Kitchen tiZANidine (ZANAFLEX) 4 MG tablet Take 4 mg by mouth at bedtime.   . TraMADol HCl 150 MG CP24 Take 150 mg by mouth at bedtime.   . traZODone (DESYREL) 100 MG tablet Take 100 mg by mouth at bedtime.   Marland Kitchen venlafaxine XR (EFFEXOR-XR) 75 MG 24 hr capsule Take 75 mg by mouth daily.      Allergies:   Other, Peanut oil, and Penicillins   Social History   Socioeconomic History  . Marital status: Widowed    Spouse name: Not on file  . Number of children: Not on file  . Years of education: Not on file  . Highest education level: Not on file  Occupational History  . Not on file  Tobacco Use  . Smoking status: Never Smoker  . Smokeless tobacco: Never Used  Substance and Sexual Activity  . Alcohol use: No  . Drug use: No  . Sexual activity: Not on file  Other Topics Concern  . Not on file  Social History Narrative  . Not on file   Social Determinants of Health   Financial Resource Strain:   . Difficulty of Paying Living Expenses: Not on file  Food Insecurity:   . Worried About Charity fundraiser in the Last Year:  Not on file  . Ran Out of Food in the Last Year: Not on file  Transportation Needs:   . Lack of Transportation (Medical): Not on file  . Lack of Transportation (Non-Medical): Not on file  Physical Activity:   . Days of Exercise per Week: Not on file  . Minutes of Exercise per Session: Not on file  Stress:   . Feeling of Stress : Not on file  Social Connections:   .  Frequency of Communication with Friends and Family: Not on file  . Frequency of Social Gatherings with Friends and Family: Not on file  . Attends Religious Services: Not on file  . Active Member of Clubs or Organizations: Not on file  . Attends Archivist Meetings: Not on file  . Marital Status: Not on file     Family History: The patient's family history includes Diabetes in her brother and mother; Diabetes (age of onset: 56) in her father; Heart disease (age of onset: 4) in her brother; Heart disease (age of onset: 83) in her mother.  ROS:   Review of Systems  Constitution: Negative for decreased appetite, fever and weight gain.  HENT: Negative for congestion, ear discharge, hoarse voice and sore throat.   Eyes: Negative for discharge, redness, vision loss in right eye and visual halos.  Cardiovascular: Negative for chest pain, dyspnea on exertion, leg swelling, orthopnea and palpitations.  Respiratory: Negative for cough, hemoptysis, shortness of breath and snoring.   Endocrine: Negative for heat intolerance and polyphagia.  Hematologic/Lymphatic: Negative for bleeding problem. Does not bruise/bleed easily.  Skin: Negative for flushing, nail changes, rash and suspicious lesions.  Musculoskeletal: Negative for arthritis, joint pain, muscle cramps, myalgias, neck pain and stiffness.  Gastrointestinal: Negative for abdominal pain, bowel incontinence, diarrhea and excessive appetite.  Genitourinary: Negative for decreased libido, genital sores and incomplete emptying.  Neurological: Negative for brief  paralysis, focal weakness, headaches and loss of balance.  Psychiatric/Behavioral: Negative for altered mental status, depression and suicidal ideas.  Allergic/Immunologic: Negative for HIV exposure and persistent infections.    EKGs/Labs/Other Studies Reviewed:    The following studies were reviewed today:   EKG: None today  Left heart catheterization 1. Multivessel CAD with moderate left main stenosis, moderate proximal LAD stenosis, nonobstructive LCx stenosis, and chronic RCA occlusion 2. S/P CABG with stable bypass graft anatomy - patent SVG-RCA, patent SVG-diagonal also supplying LAD, and atretic LIMA-LAD 3. Severe aortic stenosis with mean gradient 30 mmHg and calculated AVA 0.71 square cm 4. Mild pulmonary HTN Plan: Continued multidisciplinary evaluation for treatment of severe, symptomatic aortic stenosis  Recent Labs: 07/24/2020: BUN 21; Creatinine, Ser 0.98; Magnesium 2.0; NT-Pro BNP 450; Platelets 243 08/07/2020: Hemoglobin 11.2; Potassium 4.3; Sodium 142  Recent Lipid Panel No results found for: CHOL, TRIG, HDL, CHOLHDL, VLDL, LDLCALC, LDLDIRECT  Physical Exam:    VS:  BP 116/60   Pulse 66   Ht 5\' 3"  (1.6 m)   Wt 197 lb 6.4 oz (89.5 kg)   SpO2 95%   BMI 34.97 kg/m     Wt Readings from Last 3 Encounters:  08/12/20 197 lb 6.4 oz (89.5 kg)  08/07/20 196 lb (88.9 kg)  07/24/20 196 lb 12.8 oz (89.3 kg)     GEN: Well nourished, well developed in no acute distress HEENT: Normal NECK: No JVD; No carotid bruits LYMPHATICS: No lymphadenopathy CARDIAC: S1S2 noted,RRR, no murmurs, rubs, gallops RESPIRATORY:  Clear to auscultation without rales, wheezing or rhonchi  ABDOMEN: Soft, non-tender, non-distended, +bowel sounds, no guarding. EXTREMITIES: No edema, No cyanosis, no clubbing MUSCULOSKELETAL:  No deformity  SKIN: Warm and dry NEUROLOGIC:  Alert and oriented x 3, non-focal PSYCHIATRIC:  Normal affect, good insight  ASSESSMENT:    1. Essential hypertension    2. Nonrheumatic aortic valve stenosis   3. Chronic diastolic heart failure ([place])   4. S/P CABG x 3   5. Dyspnea on exertion    PLAN:     1.  I encouraged the patient given her significant symptom that it is worth walking through the TAVR evaluation.  At the same time we will try to see if we can connect the patient with local social worker if there is any assistance to help with her dental processes.  2.  Her blood pressure today in the office is acceptable.  3.  In terms of her coronary artery disease no changes will be made to her medication regimen.  Do suspect that her shortness of breath is likely in the setting of her severe aortic stenosis.  4.  The patient understands the need to lose weight with diet and exercise. We have discussed specific strategies for this.  The patient is in agreement with the above plan. The patient left the office in stable condition.  The patient will follow up in 3 months or sooner if needed.   Medication Adjustments/Labs and Tests Ordered: Current medicines are reviewed at length with the patient today.  Concerns regarding medicines are outlined above.  No orders of the defined types were placed in this encounter.  No orders of the defined types were placed in this encounter.   Patient Instructions  Medication Instructions:  Your physician recommends that you continue on your current medications as directed. Please refer to the Current Medication list given to you today.   *If you need a refill on your cardiac medications before your next appointment, please call your pharmacy*   Lab Work: None If you have labs (blood work) drawn today and your tests are completely normal, you will receive your results only by: Marland Kitchen MyChart Message (if you have MyChart) OR . A paper copy in the mail If you have any lab test that is abnormal or we need to change your treatment, we will call you to review the  results.   Testing/Procedures: None   Follow-Up: At [facility], you and your health needs are our priority.  As part of our continuing mission to provide you with exceptional heart care, we have created designated Provider Care Teams.  These Care Teams include your primary Cardiologist (physician) and Advanced Practice Providers (APPs -  Physician Assistants and Nurse Practitioners) who all work together to provide you with the care you need, when you need it.  We recommend signing up for the patient portal called "MyChart".  Sign up information is provided on this After Visit Summary.  MyChart is used to connect with patients for Virtual Visits (Telemedicine).  Patients are able to view lab/test results, encounter notes, upcoming appointments, etc.  Non-urgent messages can be sent to your provider as well.   To learn more about what you can do with MyChart, go to [email or web address].    Your next appointment:   3 month(s)  The format for your next appointment:   In Person  Provider:   Berniece Salines, DO   Other Instructions      Adopting a Healthy Lifestyle.  Know what a healthy weight is for you (roughly BMI <25) and aim to maintain this   Aim for 7+ servings of fruits and vegetables daily   65-80+ fluid ounces of water or unsweet tea for healthy kidneys   Limit to max 1 drink of alcohol per day; avoid smoking/tobacco   Limit animal fats in diet for cholesterol and heart health - choose grass fed whenever available   Avoid highly processed foods, and foods high in saturated/trans fats   Aim for low stress - take time to unwind and  care for your mental health   Aim for 150 min of moderate intensity exercise weekly for heart health, and weights twice weekly for bone health   Aim for 7-9 hours of sleep daily   When it comes to diets, agreement about the perfect plan isnt easy to find, even among the experts. Experts at the [employer]  developed an idea known as the Healthy Eating Plate. Just imagine a plate divided into logical, healthy portions.   The emphasis is on diet quality:   Load up on vegetables and fruits - one-half of your plate: Aim for color and variety, and remember that potatoes dont count.   Go for whole grains - one-quarter of your plate: Whole wheat, barley, wheat berries, quinoa, oats, brown rice, and foods made with them. If you want pasta, go with whole wheat pasta.   Protein power - one-quarter of your plate: Fish, chicken, beans, and nuts are all healthy, versatile protein sources. Limit red meat.   The diet, however, does go beyond the plate, offering a few other suggestions.   Use healthy plant oils, such as olive, canola, soy, corn, sunflower and peanut. Check the labels, and avoid partially hydrogenated oil, which have unhealthy trans fats.   If youre thirsty, drink water. Coffee and tea are good in moderation, but skip sugary drinks and limit milk and dairy products to one or two daily servings.   The type of carbohydrate in the diet is more important than the amount. Some sources of carbohydrates, such as vegetables, fruits, whole grains, and beans-are healthier than others.   Finally, stay active  Signed, Berniece Salines, DO  08/12/2020 10:17 AM    [place]

## 2020-08-13 ENCOUNTER — Telehealth: Payer: Self-pay | Admitting: Licensed Clinical Social Worker

## 2020-08-13 ENCOUNTER — Ambulatory Visit (HOSPITAL_COMMUNITY): Payer: Medicare HMO

## 2020-08-13 NOTE — Telephone Encounter (Signed)
Spoke with this pt again this morning regarding dental needs/assistance.  I was able to find multiple in network dental providers near her in [place], in network with her [facility] plan. I encouraged her this morning to call the insurance company regarding her coverage/schedule an initial assessment to better understand what costs she was looking at. I shared that many patients formulate a payment plan with their providers to make things more manageable. I provided my contact information so she could let us know how else we could assist her once we have a better understanding of what the cost will be associate with the dental work.   Ensured pt had food/plans for the holiday; she will be eating a thanksgiving meal with her significant other and family. I will make a note to follow up with her after the holiday to make sure the provider list made it to her/see if she has any other questions.   Westley Hummer, MSW, [employer]

## 2020-08-18 ENCOUNTER — Institutional Professional Consult (permissible substitution): Payer: Medicare HMO | Admitting: Cardiovascular Disease

## 2020-08-19 ENCOUNTER — Encounter: Payer: Self-pay | Admitting: Sports Medicine

## 2020-08-19 ENCOUNTER — Telehealth: Payer: Self-pay | Admitting: Licensed Clinical Social Worker

## 2020-08-19 ENCOUNTER — Other Ambulatory Visit: Payer: Self-pay

## 2020-08-19 ENCOUNTER — Ambulatory Visit: Payer: Medicare HMO | Admitting: Sports Medicine

## 2020-08-19 DIAGNOSIS — L97429 Non-pressure chronic ulcer of left heel and midfoot with unspecified severity: Secondary | ICD-10-CM

## 2020-08-19 DIAGNOSIS — E0800 Diabetes mellitus due to underlying condition with hyperosmolarity without nonketotic hyperglycemic-hyperosmolar coma (NKHHC): Secondary | ICD-10-CM | POA: Diagnosis not present

## 2020-08-19 DIAGNOSIS — G629 Polyneuropathy, unspecified: Secondary | ICD-10-CM

## 2020-08-19 NOTE — Addendum Note (Signed)
Addended by: Cranford Mon R on: 08/19/2020 12:53 PM   Modules accepted: Orders

## 2020-08-19 NOTE — Progress Notes (Signed)
Subjective: Julie Leblanc is a 74 y.o. female patient seen in office for follow-up evaluation of left heel ulcer.  Patient reports that wound feels deeper and more sore. Worse pain at night shooting up the leg.   FBS not recorded, had CAKE last night for dessert.  Patient Active Problem List   Diagnosis Date Noted  . Dyspnea on exertion 07/24/2020  . Chronic diastolic heart failure ([place]) 07/24/2020  . Diabetes mellitus ([place])   . GERD (gastroesophageal reflux disease)   . Hyperlipemia   . Hypertension   . Neuropathy   . Coronary artery disease involving coronary bypass graft of native heart without angina pectoris 02/09/2018  . Cellulitis of both lower extremities 06/17/2014  . Obesity, Class I, BMI 30.0-34.9 (see actual BMI) 06/14/2014  . Essential hypertension 05/10/2014  . Chest pain 05/10/2014  . Abnormal nuclear stress test 05/10/2014  . Preoperative cardiovascular examination 04/05/2014  . S/P CABG x 3 10/05/2013  . DM2 (diabetes mellitus, type 2) ([place]) 10/05/2013  . Dyslipidemia 10/05/2013  . Aortic valve stenosis 10/05/2013  . Dizzy 03/27/2013  . CAD (coronary artery disease) 03/27/2013  . Dark stools 03/27/2013  . Knee joint pain 12/21/2012  . Low back pain 12/21/2012  . Aortic stenosis, mild 10/2012   Current Outpatient Medications on File Prior to Visit  Medication Sig Dispense Refill  . acetaminophen (TYLENOL) 650 MG CR tablet Take 1,300 mg by mouth in the morning and at bedtime.     . ALPRAZolam (XANAX) 1 MG tablet Take 1 mg by mouth at bedtime.     Marland Kitchen apixaban (ELIQUIS) 5 MG TABS tablet Take 5 mg by mouth 2 (two) times daily.    Marland Kitchen aspirin EC 81 MG tablet Take 81 mg by mouth daily. Swallow whole.    Marland Kitchen atorvastatin (LIPITOR) 40 MG tablet Take 40 mg by mouth daily.     . bisacodyl (DULCOLAX) 5 MG EC tablet Take 5 mg by mouth daily as needed for moderate constipation.    . chlorthalidone (HYGROTON) 25 MG tablet Take 12.5 mg by mouth daily.    Marland Kitchen diltiazem (CARDIZEM  SR) 120 MG 12 hr capsule Take 1 capsule (120 mg total) by mouth 2 (two) times daily. 60 capsule 11  . diphenhydramine-acetaminophen (TYLENOL PM) 25-500 MG TABS Take 1 tablet by mouth at bedtime.     Marland Kitchen EPINEPHrine (EPI-PEN) 0.3 mg/0.3 mL SOAJ injection Inject 0.3 mg into the muscle once as needed for anaphylaxis (for peanut allergy).     . fenofibrate (TRICOR) 145 MG tablet Take 1 tablet (145 mg total) by mouth daily. 90 tablet 3  . furosemide (LASIX) 40 MG tablet Take 40 mg by mouth daily.    Marland Kitchen gabapentin (NEURONTIN) 300 MG capsule Take 600 mg by mouth 3 (three) times daily.     Marland Kitchen gabapentin (NEURONTIN) 400 MG capsule Take 400 mg by mouth.    . icosapent Ethyl (VASCEPA) 1 g capsule Take 2 g by mouth 2 (two) times daily.    . irbesartan (AVAPRO) 300 MG tablet Take 1 tablet (300 mg total) by mouth daily. 30 tablet 5  . Loratadine 10 MG CAPS Take 10 mg by mouth daily.     . metFORMIN (GLUCOPHAGE-XR) 750 MG 24 hr tablet Take 750 mg by mouth in the morning and at bedtime.     . metoprolol (LOPRESSOR) 50 MG tablet Take 50 mg by mouth 3 (three) times daily.    . Multiple Vitamin (MULTIVITAMIN WITH MINERALS) TABS Take 1 tablet  by mouth daily.    . nitroGLYCERIN (NITROSTAT) 0.4 MG SL tablet Place 1 tablet (0.4 mg total) under the tongue every 5 (five) minutes as needed for chest pain. 25 tablet 3  . Omega-3 1000 MG CAPS Take 1,000 mg by mouth daily.    . pantoprazole (PROTONIX) 40 MG tablet Take 40 mg by mouth daily.     Marland Kitchen tiZANidine (ZANAFLEX) 4 MG tablet Take 4 mg by mouth at bedtime.     . TraMADol HCl 150 MG CP24 Take 150 mg by mouth at bedtime.     . traZODone (DESYREL) 100 MG tablet Take 100 mg by mouth at bedtime.     . TRUEplus Lancets 30G MISC     . venlafaxine XR (EFFEXOR-XR) 75 MG 24 hr capsule Take 75 mg by mouth daily.      No current facility-administered medications on file prior to visit.   Allergies  Allergen Reactions  . Other Anaphylaxis and Swelling    Nuts   . Peanut Oil  Shortness Of Breath  . Penicillins Hives and Shortness Of Breath    Recent Results (from the past 2160 hour(s))  WOUND CULTURE     Status: Abnormal   Collection Time: 06/24/20  4:48 PM   Specimen: Foot, Left; Wound   Wound Culture and sens  Result Value Ref Range   Gram Stain Result Final report    Organism ID, Bacteria Comment     Comment: Rare white blood cells.   Organism ID, Bacteria Comment     Comment: Few gram positive cocci   Aerobic Bacterial Culture Final report (A)    Organism ID, Bacteria Comment (A)     Comment: Methicillin - resistant Staphylococcus aureus Based on resistance to oxacillin this isolate would be resistant to all currently available beta-lactam antimicrobial agents, with the exception of the newer cephalosporins with anti-MRSA activity, such as Ceftaroline Heavy growth    Antimicrobial Susceptibility Comment     Comment:       ** S = Susceptible; I = Intermediate; R = Resistant **                    P = Positive; N = Negative             MICS are expressed in micrograms per mL    Antibiotic                 RSLT#1    RSLT#2    RSLT#3    RSLT#4 Ciprofloxacin                  S Clindamycin                    I Erythromycin                   S Gentamicin                     S Levofloxacin                   S Linezolid                      S Oxacillin                      R Penicillin  R Rifampin                       S Tetracycline                   S Trimethoprim/Sulfa             S Vancomycin                     S   ECHOCARDIOGRAM COMPLETE     Status: None   Collection Time: 07/09/20  9:53 AM  Result Value Ref Range   S' Lateral 3.00 cm   Area-P 1/2 3.27 cm2   AV Area mean vel 0.97 cm2   AV Area VTI 0.96 cm2   AR max vel 1.02 cm2   AV Mean grad 25.3 mmHg   Ao pk vel 3.25 m/s   AV Peak grad 42.2 mmHg  Basic metabolic panel     Status: Abnormal   Collection Time: 07/24/20 11:02 AM  Result Value Ref Range   Glucose 125  (H) 65 - 99 mg/dL   BUN 21 8 - 27 mg/dL   Creatinine, Ser 0.98 0.57 - 1.00 mg/dL   GFR calc non Af Amer 57 (L) >59 mL/min/1.73   GFR calc Af Amer 66 >59 mL/min/1.73    Comment: **In accordance with recommendations from the NKF-ASN Task force,**   [facility] is in the process of updating its eGFR calculation to the   2021 CKD-EPI creatinine equation that estimates kidney function   without a race variable.    BUN/Creatinine Ratio 21 12 - 28   Sodium 142 134 - 144 mmol/L   Potassium 4.6 3.5 - 5.2 mmol/L   Chloride 100 96 - 106 mmol/L   CO2 26 20 - 29 mmol/L   Calcium 10.8 (H) 8.7 - 10.3 mg/dL  CBC     Status: Abnormal   Collection Time: 07/24/20 11:02 AM  Result Value Ref Range   WBC 7.0 3.4 - 10.8 x10E3/uL   RBC 3.76 (L) 3.77 - 5.28 x10E6/uL   Hemoglobin 11.8 11.1 - 15.9 g/dL   Hematocrit 35.6 34.0 - 46.6 %   MCV 95 79 - 97 fL   MCH 31.4 26.6 - 33.0 pg   MCHC 33.1 31 - 35 g/dL   RDW 13.1 11.7 - 15.4 %   Platelets 243 150 - 450 x10E3/uL  Magnesium     Status: None   Collection Time: 07/24/20 11:02 AM  Result Value Ref Range   Magnesium 2.0 1.6 - 2.3 mg/dL  Pro b natriuretic peptide (BNP)     Status: Abnormal   Collection Time: 07/24/20 11:02 AM  Result Value Ref Range   NT-Pro BNP 450 (H) 0 - 301 pg/mL    Comment: The following cut-points have been suggested for the use of proBNP for the diagnostic evaluation of heart failure (HF) in patients with acute dyspnea: Modality                     Age           Optimal Cut                            (years)            Point ------------------------------------------------------ Diagnosis (rule in HF)        <50  450 pg/mL                           50 - 75            900 pg/mL                               >75           1800 pg/mL Exclusion (rule out HF)  Age independent     300 pg/mL   SARS CORONAVIRUS 2 (TAT 6-24 HRS) Nasopharyngeal Nasopharyngeal Swab     Status: None   Collection Time: 08/04/20 10:38 AM   Specimen:  Nasopharyngeal Swab  Result Value Ref Range   SARS Coronavirus 2 NEGATIVE NEGATIVE    Comment: (NOTE) SARS-CoV-2 target nucleic acids are NOT DETECTED.  The SARS-CoV-2 RNA is generally detectable in upper and lower respiratory specimens during the acute phase of infection. Negative results do not preclude SARS-CoV-2 infection, do not rule out co-infections with other pathogens, and should not be used as the sole basis for treatment or other patient management decisions. Negative results must be combined with clinical observations, patient history, and epidemiological information. The expected result is Negative.  Fact Sheet for Patients: SugarRoll.be  Fact Sheet for Healthcare Providers: https://www.woods-mathews.com/  This test is not yet approved or cleared by the [place] FDA and  has been authorized for detection and/or diagnosis of SARS-CoV-2 by FDA under an Emergency Use Authorization (EUA). This EUA will remain  in effect (meaning this test can be used) for the duration of the COVID-19 declaration under Se ction 564(b)(1) of the Act, 21 U.S.C. section 360bbb-3(b)(1), unless the authorization is terminated or revoked sooner.  Performed at [facility], [place] [address] [place], [place] [address]   Glucose, capillary     Status: Abnormal   Collection Time: 08/07/20  7:00 AM  Result Value Ref Range   Glucose-Capillary 140 (H) 70 - 99 mg/dL    Comment: Glucose reference range applies only to samples taken after fasting for at least 8 hours.   Comment 1 Notify RN   POCT I-Stat EG7     Status: Abnormal   Collection Time: 08/07/20  9:43 AM  Result Value Ref Range   pH, Ven 7.286 7.25 - 7.43   pCO2, Ven 61.5 (H) 44 - 60 mmHg   pO2, Ven 38.0 32 - 45 mmHg   Bicarbonate 29.3 (H) 20.0 - 28.0 mmol/L   TCO2 31 22 - 32 mmol/L   O2 Saturation 64.0 %   Acid-Base Excess 1.0 0.0 - 2.0 mmol/L   Sodium 144 135 - 145 mmol/L    Potassium 4.2 3.5 - 5.1 mmol/L   Calcium, Ion 1.31 1.15 - 1.40 mmol/L   HCT 34.0 (L) 36 - 46 %   Hemoglobin 11.6 (L) 12.0 - 15.0 g/dL   Sample type VENOUS    Comment NOTIFIED PHYSICIAN   I-STAT 7, (LYTES, BLD GAS, ICA, H+H)     Status: Abnormal   Collection Time: 08/07/20  9:55 AM  Result Value Ref Range   pH, Arterial 7.309 (L) 7.35 - 7.45   pCO2 arterial 58.9 (H) 32 - 48 mmHg   pO2, Arterial 100 83 - 108 mmHg   Bicarbonate 29.6 (H) 20.0 - 28.0 mmol/L   TCO2 31 22 - 32 mmol/L   O2 Saturation 97.0 %   Acid-Base Excess 2.0  0.0 - 2.0 mmol/L   Sodium 142 135 - 145 mmol/L   Potassium 4.3 3.5 - 5.1 mmol/L   Calcium, Ion 1.33 1.15 - 1.40 mmol/L   HCT 33.0 (L) 36 - 46 %   Hemoglobin 11.2 (L) 12.0 - 15.0 g/dL   Sample type ARTERIAL     Objective: There were no vitals filed for this visit.  General: Patient is awake, alert, oriented x 3 and in no acute distress.  Dermatology: Skin is warm and dry bilateral with a full thickness ulceration present posterior heel on left. Ulceration measures 0.5 cm x 0.4cm x 0.3 cm, larger than previous. There is a Keratotic border with a granular base. The ulceration does not probe to bone. There is no malodor, no active drainage, blanchable erythema, focal edema. Minimal acute signs of infection.  Minimal reactive callus sub met 5 on right and left 1st toe medial aspect with no signs of infection.  Left 1st toe history of nail removal with small specks in the nail bed with no signs of infection.    Vascular: Dorsalis Pedis pulse = 1/4 Bilateral,  Posterior Tibial pulse = 1/4 Bilateral,  Capillary Fill Time < 5 seconds  Neurologic: Protective severely diminished bilateral.  Musculosketal: There is mild pain with palpation to ulcerated area that is improving compared to last visit. No pain with compression to calves bilateral. +Bunion and hammertoe deformity bilateral.  No results for input(s): GRAMSTAIN, LABORGA in the last 8760 hours.  Assessment  and Plan:  Problem List Items Addressed This Visit      Endocrine   Diabetes mellitus ([place])     Nervous and Auditory   Neuropathy    Other Visit Diagnoses    Ulcer of left heel and midfoot, unspecified ulcer stage ([place])    -  Primary      -Examined patient and discussed the progression of the wound and treatment alternatives. - Cleansed wound and obtain wound culture -Applied Iodosorb and bandaid dressing and instructed patient to continue with daily dressings at home  -Advised patient to continue with gabapentin for likely pain related to neuropathy and may add on extra strength Tylenol at bedtime to help - Advised patient to go to the ER or return to office if the wound worsens or if constitutional symptoms are present. -Patient to return to office in 2 weeks for follow up care and evaluation or sooner if problems arise.  Landis Martins, DPM

## 2020-08-19 NOTE — Telephone Encounter (Signed)
CSW reached out to pt to see if she has gotten the dental resources yet. At this time she has not yet- pt states she will call me if any questions and is okay with me following up again later this week.   Westley Hummer, MSW, [place] [facility]

## 2020-08-25 ENCOUNTER — Other Ambulatory Visit: Payer: Self-pay | Admitting: Sports Medicine

## 2020-08-25 ENCOUNTER — Telehealth: Payer: Self-pay | Admitting: Licensed Clinical Social Worker

## 2020-08-25 LAB — WOUND CULTURE

## 2020-08-25 MED ORDER — CIPROFLOXACIN HCL 500 MG PO TABS: 500.0000 mg | ORAL_TABLET | Freq: Two times a day (BID) | ORAL | 0 refills | Status: DC

## 2020-08-25 NOTE — Telephone Encounter (Signed)
Please advise 

## 2020-08-25 NOTE — Telephone Encounter (Signed)
CSW called and spoke w/ pt this morning. She has received the list of dental providers from this writer. She states she hasnt had time to look through them just yet but hopes to have time today. I will make team aware.   Westley Hummer, MSW, [place]  [phone]

## 2020-08-25 NOTE — Progress Notes (Signed)
Sent Cipro for + wound culture

## 2020-08-29 ENCOUNTER — Telehealth: Payer: Self-pay | Admitting: Licensed Clinical Social Worker

## 2020-08-29 NOTE — Telephone Encounter (Signed)
CSW spoke w/ pt this morning, pt states she still hasn't called the dentists that had been sent sharing that Christmas is coming up and she has financial obligations surrounding the holidays. CSW encouraged pt to call a dentist as soon as pt felt comfortable as her health is a priority and we want to make sure she gets the care she needs. CSW will pass this update on to [place], MSW, [place]  [phone]

## 2020-09-02 ENCOUNTER — Ambulatory Visit: Payer: Medicare HMO | Admitting: Sports Medicine

## 2020-09-02 ENCOUNTER — Encounter: Payer: Self-pay | Admitting: Sports Medicine

## 2020-09-02 ENCOUNTER — Other Ambulatory Visit: Payer: Self-pay

## 2020-09-02 DIAGNOSIS — E0800 Diabetes mellitus due to underlying condition with hyperosmolarity without nonketotic hyperglycemic-hyperosmolar coma (NKHHC): Secondary | ICD-10-CM | POA: Diagnosis not present

## 2020-09-02 DIAGNOSIS — L97429 Non-pressure chronic ulcer of left heel and midfoot with unspecified severity: Secondary | ICD-10-CM

## 2020-09-02 DIAGNOSIS — G629 Polyneuropathy, unspecified: Secondary | ICD-10-CM | POA: Diagnosis not present

## 2020-09-02 MED ORDER — LEVOFLOXACIN 500 MG PO TABS
500.0000 mg | ORAL_TABLET | Freq: Every day | ORAL | 0 refills | Status: DC
Start: 2020-09-02 — End: 2021-05-19

## 2020-09-02 NOTE — Progress Notes (Signed)
Subjective: Julie Leblanc is a 74 y.o. female patient seen in office for follow-up evaluation of left heel ulcer.  Patient reports that wound feels like it is doing better and has stopped walking around the house barefoot which has helped. FBS not recorded, A1c unknown.  Patient Active Problem List   Diagnosis Date Noted  . Dyspnea on exertion 07/24/2020  . Chronic diastolic heart failure ([place]) 07/24/2020  . Diabetes mellitus ([place])   . GERD (gastroesophageal reflux disease)   . Hyperlipemia   . Hypertension   . Neuropathy   . Coronary artery disease involving coronary bypass graft of native heart without angina pectoris 02/09/2018  . Cellulitis of both lower extremities 06/17/2014  . Obesity, Class I, BMI 30.0-34.9 (see actual BMI) 06/14/2014  . Essential hypertension 05/10/2014  . Chest pain 05/10/2014  . Abnormal nuclear stress test 05/10/2014  . Preoperative cardiovascular examination 04/05/2014  . S/P CABG x 3 10/05/2013  . DM2 (diabetes mellitus, type 2) ([place]) 10/05/2013  . Dyslipidemia 10/05/2013  . Aortic valve stenosis 10/05/2013  . Dizzy 03/27/2013  . CAD (coronary artery disease) 03/27/2013  . Dark stools 03/27/2013  . Knee joint pain 12/21/2012  . Low back pain 12/21/2012  . Aortic stenosis, mild 10/2012   Current Outpatient Medications on File Prior to Visit  Medication Sig Dispense Refill  . acetaminophen (TYLENOL) 650 MG CR tablet Take 1,300 mg by mouth in the morning and at bedtime.     . ALPRAZolam (XANAX) 1 MG tablet Take 1 mg by mouth at bedtime.     Marland Kitchen apixaban (ELIQUIS) 5 MG TABS tablet Take 5 mg by mouth 2 (two) times daily.    Marland Kitchen aspirin EC 81 MG tablet Take 81 mg by mouth daily. Swallow whole.    Marland Kitchen atorvastatin (LIPITOR) 40 MG tablet Take 40 mg by mouth daily.     . bisacodyl (DULCOLAX) 5 MG EC tablet Take 5 mg by mouth daily as needed for moderate constipation.    . chlorthalidone (HYGROTON) 25 MG tablet Take 12.5 mg by mouth daily.    Marland Kitchen diltiazem  (CARDIZEM SR) 120 MG 12 hr capsule Take 1 capsule (120 mg total) by mouth 2 (two) times daily. 60 capsule 11  . diphenhydramine-acetaminophen (TYLENOL PM) 25-500 MG TABS Take 1 tablet by mouth at bedtime.     Marland Kitchen EPINEPHrine (EPI-PEN) 0.3 mg/0.3 mL SOAJ injection Inject 0.3 mg into the muscle once as needed for anaphylaxis (for peanut allergy).     . fenofibrate (TRICOR) 145 MG tablet Take 1 tablet (145 mg total) by mouth daily. 90 tablet 3  . furosemide (LASIX) 40 MG tablet Take 40 mg by mouth daily.    Marland Kitchen gabapentin (NEURONTIN) 300 MG capsule Take 600 mg by mouth 3 (three) times daily.     Marland Kitchen gabapentin (NEURONTIN) 400 MG capsule Take 400 mg by mouth.    . icosapent Ethyl (VASCEPA) 1 g capsule Take 2 g by mouth 2 (two) times daily.    . irbesartan (AVAPRO) 300 MG tablet Take 1 tablet (300 mg total) by mouth daily. 30 tablet 5  . Loratadine 10 MG CAPS Take 10 mg by mouth daily.     . metFORMIN (GLUCOPHAGE-XR) 750 MG 24 hr tablet Take 750 mg by mouth in the morning and at bedtime.     . metoprolol (LOPRESSOR) 50 MG tablet Take 50 mg by mouth 3 (three) times daily.    . Multiple Vitamin (MULTIVITAMIN WITH MINERALS) TABS Take 1 tablet by mouth  daily.    . nitroGLYCERIN (NITROSTAT) 0.4 MG SL tablet Place 1 tablet (0.4 mg total) under the tongue every 5 (five) minutes as needed for chest pain. 25 tablet 3  . Omega-3 1000 MG CAPS Take 1,000 mg by mouth daily.    . pantoprazole (PROTONIX) 40 MG tablet Take 40 mg by mouth daily.     Marland Kitchen tiZANidine (ZANAFLEX) 4 MG tablet Take 4 mg by mouth at bedtime.     . TraMADol HCl 150 MG CP24 Take 150 mg by mouth at bedtime.     . traZODone (DESYREL) 100 MG tablet Take 100 mg by mouth at bedtime.     . TRUEplus Lancets 30G MISC     . venlafaxine XR (EFFEXOR-XR) 75 MG 24 hr capsule Take 75 mg by mouth daily.      No current facility-administered medications on file prior to visit.   Allergies  Allergen Reactions  . Other Anaphylaxis and Swelling    Nuts   .  Peanut Oil Shortness Of Breath  . Penicillins Hives and Shortness Of Breath    Recent Results (from the past 2160 hour(s))  WOUND CULTURE     Status: Abnormal   Collection Time: 06/24/20  4:48 PM   Specimen: Foot, Left; Wound   Wound Culture and sens  Result Value Ref Range   Gram Stain Result Final report    Organism ID, Bacteria Comment     Comment: Rare white blood cells.   Organism ID, Bacteria Comment     Comment: Few gram positive cocci   Aerobic Bacterial Culture Final report (A)    Organism ID, Bacteria Comment (A)     Comment: Methicillin - resistant Staphylococcus aureus Based on resistance to oxacillin this isolate would be resistant to all currently available beta-lactam antimicrobial agents, with the exception of the newer cephalosporins with anti-MRSA activity, such as Ceftaroline Heavy growth    Antimicrobial Susceptibility Comment     Comment:       ** S = Susceptible; I = Intermediate; R = Resistant **                    P = Positive; N = Negative             MICS are expressed in micrograms per mL    Antibiotic                 RSLT#1    RSLT#2    RSLT#3    RSLT#4 Ciprofloxacin                  S Clindamycin                    I Erythromycin                   S Gentamicin                     S Levofloxacin                   S Linezolid                      S Oxacillin                      R Penicillin                     R  Rifampin                       S Tetracycline                   S Trimethoprim/Sulfa             S Vancomycin                     S   ECHOCARDIOGRAM COMPLETE     Status: None   Collection Time: 07/09/20  9:53 AM  Result Value Ref Range   S' Lateral 3.00 cm   Area-P 1/2 3.27 cm2   AV Area mean vel 0.97 cm2   AV Area VTI 0.96 cm2   AR max vel 1.02 cm2   AV Mean grad 25.3 mmHg   Ao pk vel 3.25 m/s   AV Peak grad 42.2 mmHg  Basic metabolic panel     Status: Abnormal   Collection Time: 07/24/20 11:02 AM  Result Value Ref Range    Glucose 125 (H) 65 - 99 mg/dL   BUN 21 8 - 27 mg/dL   Creatinine, Ser 0.98 0.57 - 1.00 mg/dL   GFR calc non Af Amer 57 (L) >59 mL/min/1.73   GFR calc Af Amer 66 >59 mL/min/1.73    Comment: **In accordance with recommendations from the NKF-ASN Task force,**   [facility] is in the process of updating its eGFR calculation to the   2021 CKD-EPI creatinine equation that estimates kidney function   without a race variable.    BUN/Creatinine Ratio 21 12 - 28   Sodium 142 134 - 144 mmol/L   Potassium 4.6 3.5 - 5.2 mmol/L   Chloride 100 96 - 106 mmol/L   CO2 26 20 - 29 mmol/L   Calcium 10.8 (H) 8.7 - 10.3 mg/dL  CBC     Status: Abnormal   Collection Time: 07/24/20 11:02 AM  Result Value Ref Range   WBC 7.0 3.4 - 10.8 x10E3/uL   RBC 3.76 (L) 3.77 - 5.28 x10E6/uL   Hemoglobin 11.8 11.1 - 15.9 g/dL   Hematocrit 35.6 34.0 - 46.6 %   MCV 95 79 - 97 fL   MCH 31.4 26.6 - 33.0 pg   MCHC 33.1 31.5 - 35.7 g/dL   RDW 13.1 11.7 - 15.4 %   Platelets 243 150 - 450 x10E3/uL  Magnesium     Status: None   Collection Time: 07/24/20 11:02 AM  Result Value Ref Range   Magnesium 2.0 1.6 - 2.3 mg/dL  Pro b natriuretic peptide (BNP)     Status: Abnormal   Collection Time: 07/24/20 11:02 AM  Result Value Ref Range   NT-Pro BNP 450 (H) 0 - 301 pg/mL    Comment: The following cut-points have been suggested for the use of proBNP for the diagnostic evaluation of heart failure (HF) in patients with acute dyspnea: Modality                     Age           Optimal Cut                            (years)            Point ------------------------------------------------------ Diagnosis (rule in HF)        <50  450 pg/mL                           50 - 75            900 pg/mL                               >75           1800 pg/mL Exclusion (rule out HF)  Age independent     300 pg/mL   SARS CORONAVIRUS 2 (TAT 6-24 HRS) Nasopharyngeal Nasopharyngeal Swab     Status: None   Collection Time: 08/04/20 10:38  AM   Specimen: Nasopharyngeal Swab  Result Value Ref Range   SARS Coronavirus 2 NEGATIVE NEGATIVE    Comment: (NOTE) SARS-CoV-2 target nucleic acids are NOT DETECTED.  The SARS-CoV-2 RNA is generally detectable in upper and lower respiratory specimens during the acute phase of infection. Negative results do not preclude SARS-CoV-2 infection, do not rule out co-infections with other pathogens, and should not be used as the sole basis for treatment or other patient management decisions. Negative results must be combined with clinical observations, patient history, and epidemiological information. The expected result is Negative.  Fact Sheet for Patients: SugarRoll.be  Fact Sheet for Healthcare Providers: https://www.woods-mathews.com/  This test is not yet approved or cleared by the [place] FDA and  has been authorized for detection and/or diagnosis of SARS-CoV-2 by FDA under an Emergency Use Authorization (EUA). This EUA will remain  in effect (meaning this test can be used) for the duration of the COVID-19 declaration under Se ction 564(b)(1) of the Act, 21 U.S.C. section 360bbb-3(b)(1), unless the authorization is terminated or revoked sooner.  Performed at [facility], [place] [address] [place], [place] [address]   Glucose, capillary     Status: Abnormal   Collection Time: 08/07/20  7:00 AM  Result Value Ref Range   Glucose-Capillary 140 (H) 70 - 99 mg/dL    Comment: Glucose reference range applies only to samples taken after fasting for at least 8 hours.   Comment 1 Notify RN   POCT I-Stat EG7     Status: Abnormal   Collection Time: 08/07/20  9:43 AM  Result Value Ref Range   pH, Ven 7.286 7.250 - 7.430   pCO2, Ven 61.5 (H) 44.0 - 60.0 mmHg   pO2, Ven 38.0 32.0 - 45.0 mmHg   Bicarbonate 29.3 (H) 20.0 - 28.0 mmol/L   TCO2 31 22 - 32 mmol/L   O2 Saturation 64.0 %   Acid-Base Excess 1.0 0.0 - 2.0 mmol/L   Sodium 144  135 - 145 mmol/L   Potassium 4.2 3.5 - 5.1 mmol/L   Calcium, Ion 1.31 1.15 - 1.40 mmol/L   HCT 34.0 (L) 36.0 - 46.0 %   Hemoglobin 11.6 (L) 12.0 - 15.0 g/dL   Sample type VENOUS    Comment NOTIFIED PHYSICIAN   I-STAT 7, (LYTES, BLD GAS, ICA, H+H)     Status: Abnormal   Collection Time: 08/07/20  9:55 AM  Result Value Ref Range   pH, Arterial 7.309 (L) 7.350 - 7.450   pCO2 arterial 58.9 (H) 32.0 - 48.0 mmHg   pO2, Arterial 100 83.0 - 108.0 mmHg   Bicarbonate 29.6 (H) 20.0 - 28.0 mmol/L   TCO2 31 22 - 32 mmol/L   O2 Saturation 97.0 %   Acid-Base Excess 2.0  0.0 - 2.0 mmol/L   Sodium 142 135 - 145 mmol/L   Potassium 4.3 3.5 - 5.1 mmol/L   Calcium, Ion 1.33 1.15 - 1.40 mmol/L   HCT 33.0 (L) 36.0 - 46.0 %   Hemoglobin 11.2 (L) 12.0 - 15.0 g/dL   Sample type ARTERIAL   WOUND CULTURE     Status: Abnormal   Collection Time: 08/19/20 12:52 PM   Specimen: Foot, Left; Wound   Wound Culture and sens  Result Value Ref Range   Gram Stain Result Final report    Organism ID, Bacteria Comment     Comment: No white blood cells seen.   Organism ID, Bacteria Comment     Comment: Many gram positive cocci.   Aerobic Bacterial Culture Final report (A)    Organism ID, Bacteria Staphylococcus aureus (A)     Comment: Based on susceptibility to oxacillin this isolate would be susceptible to: *Penicillinase-stable penicillins, such as:   Cloxacillin, Dicloxacillin, Nafcillin *Beta-lactam combination agents, such as:   Amoxicillin-clavulanic acid, Ampicillin-sulbactam,   Piperacillin-tazobactam *Oral cephems, such as:   Cefaclor, Cefdinir, Cefpodoxime, Cefprozil, Cefuroxime,   Cephalexin, Loracarbef *Parenteral cephems, such as:   Cefazolin, Cefepime, Cefotaxime, Cefotetan, Ceftaroline,   Ceftizoxime, Ceftriaxone, Cefuroxime *Carbapenems, such as:   Doripenem, Ertapenem, Imipenem, Meropenem Heavy growth    Organism ID, Bacteria Mixed skin flora     Comment: Heavy growth   Antimicrobial  Susceptibility Comment     Comment:       ** S = Susceptible; I = Intermediate; R = Resistant **                    P = Positive; N = Negative             MICS are expressed in micrograms per mL    Antibiotic                 RSLT#1    RSLT#2    RSLT#3    RSLT#4 Ciprofloxacin                  S Clindamycin                    S Erythromycin                   S Gentamicin                     S Levofloxacin                   S Linezolid                      S Moxifloxacin                   S Oxacillin                      S Penicillin                     R Quinupristin/Dalfopristin      S Rifampin                       S Tetracycline                   S Trimethoprim/Sulfa             S Vancomycin  S     Objective: There were no vitals filed for this visit.  General: Patient is awake, alert, oriented x 3 and in no acute distress.  Dermatology: Skin is warm and dry bilateral with a full thickness ulceration present posterior heel on left. Ulceration measures 0.5 cm x 0.3cm x 0.2 cm, larger than previous. There is a Keratotic border with a granular base. The ulceration does not probe to bone. There is no malodor, no active drainage, resolved erythema, minimal focal edema. Minimal acute signs of infection.  Minimal reactive callus sub met 5 on right and left 1st toe medial aspect with no signs of infection.  Left 1st toe history of nail removal with small specks in the nail bed with no signs of infection.    Vascular: Dorsalis Pedis pulse = 1/4 Bilateral,  Posterior Tibial pulse = 1/4 Bilateral,  Capillary Fill Time < 5 seconds  Neurologic: Protective severely diminished bilateral.  Musculosketal: There is mild pain with palpation to ulcerated area that is improving compared to last visit. No pain with compression to calves bilateral. +Bunion and hammertoe deformity bilateral.  No results for input(s): GRAMSTAIN, LABORGA in the last 8760 hours.  Assessment and  Plan:  Problem List Items Addressed This Visit      Endocrine   Diabetes mellitus ([place])     Nervous and Auditory   Neuropathy    Other Visit Diagnoses    Ulcer of left heel and midfoot, unspecified ulcer stage ([place])    -  Primary      -Examined patient and discussed the progression of the wound and treatment alternatives. - Cleansed wound and applied Iodosorb and bandaid dressing and instructed patient to continue with daily dressings at home  -Advised patient to continue with gabapentin for likely pain related to neuropathy and may add on extra strength Tylenol at bedtime to help like before -Refill Levaquin for patient to take as instructed -Advised patient to refrain from walking around the house barefooted - Advised patient to go to the ER or return to office if the wound worsens or if constitutional symptoms are present. -Patient to return to office in 2 weeks for follow up care and evaluation or sooner if problems arise.  Landis Martins, DPM

## 2020-09-16 ENCOUNTER — Telehealth: Payer: Self-pay | Admitting: Licensed Clinical Social Worker

## 2020-09-16 NOTE — Telephone Encounter (Signed)
Called pt this afternoon at [phone]. Re-introduced self, role, reason for call.  I have reached out to the pt multiple times, provided her with list of in network dental options and attempted to see if there are any surrounding costs that we can help out with in order to make costs associated with the dentist more manageable.   She confirmed she has the list but still has not called any of the dentists to get an estimate/summary of her benefits in order for [place] to understand what the costs would be. I have tried to explain to her each time the importance of getting her teeth checked for her cardiac health and that you all arent trying to just add another expense to her plate.   Pt this call again states that she doesn't have money to pay for dental work and doesn't intend to call the dentists at this time. CSW shared that we remain available to find ways to assist pt in the future should she desire to get dental work scheduled moving forward. Pt states understanding. CSW sent f/u to Dr. Tyler Deis who pt sees in February and Julieta Gutting, RN, w/ structural team to provide them with updates from calls. Care Navigation team remains available in the future should additional needs arise that we may be able to assist with.   Octavio Graves, MSW, LCSW [facility] Heart[facility]Vascular Care Navigation  [phone]

## 2020-09-23 ENCOUNTER — Telehealth: Payer: Self-pay | Admitting: Licensed Clinical Social Worker

## 2020-09-23 NOTE — Telephone Encounter (Signed)
CSW received a call from pt sharing that she has finally decided to call some community dentists to be evaluated. She has identified one dentist she is interested in seeing and their office is requesting information be sent to them for them to understand what kind of evaluation that she needs for [facility] team. CSW reached back out to Julieta Gutting, RN, who will assist pt and LCSW with this request. Information to be sent to Dr. Lilla Shook, DDS, [address] [place], [place] [address], phone number- [phone]; fax number- [phone].  CSW remains available moving forward should additional concerns arise.   Octavio Graves, MSW, LCSW [facility] Heart/Vascular Care Navigation  [phone]

## 2020-09-24 NOTE — Telephone Encounter (Signed)
I contacted Dr Cindi Carbon office and they have requested that I fax a letter in regards to what is needed for pt's dental evaluation.  The pt will require dental clearance of no active infections before the pt can proceed with Aortic Valve Replacement evaluation.  Letter created, saved in the letters tab in Epic and faxed to Dr [facility] office.    I also contacted the pt and advised that she can contact Dr Cindi Carbon office to schedule appointment. She will contact me once an appointment has been arranged. The pt states that dependent upon this dental evaluation that the treatment recommended may be cost prohibitive.  I advised the pt to contact me after the evaluation and we can further discuss plan at that time. Pt agreed with plan.

## 2020-09-26 ENCOUNTER — Encounter: Payer: Self-pay | Admitting: Sports Medicine

## 2020-09-26 ENCOUNTER — Other Ambulatory Visit: Payer: Self-pay

## 2020-09-26 ENCOUNTER — Ambulatory Visit: Payer: Medicare HMO | Admitting: Sports Medicine

## 2020-09-26 DIAGNOSIS — L97429 Non-pressure chronic ulcer of left heel and midfoot with unspecified severity: Secondary | ICD-10-CM | POA: Diagnosis not present

## 2020-09-26 DIAGNOSIS — E0800 Diabetes mellitus due to underlying condition with hyperosmolarity without nonketotic hyperglycemic-hyperosmolar coma (NKHHC): Secondary | ICD-10-CM

## 2020-09-26 DIAGNOSIS — G629 Polyneuropathy, unspecified: Secondary | ICD-10-CM | POA: Diagnosis not present

## 2020-09-26 NOTE — Progress Notes (Signed)
Subjective: Julie Leblanc is a 74 y.o. female patient seen in office for follow-up evaluation of left heel ulcer  Patient reports that wound feels like is better very little drainage and little pain. No other issues noted.  FBS not recorded, A1c unknown.  Patient Active Problem List   Diagnosis Date Noted  . Dyspnea on exertion 07/24/2020  . Chronic diastolic heart failure (HCC) 07/24/2020  . Diabetes mellitus (HCC)   . GERD (gastroesophageal reflux disease)   . Hyperlipemia   . Hypertension   . Neuropathy   . Coronary artery disease involving coronary bypass graft of native heart without angina pectoris 02/09/2018  . Cellulitis of both lower extremities 06/17/2014  . Obesity, Class I, BMI 30.0-34.9 (see actual BMI) 06/14/2014  . Essential hypertension 05/10/2014  . Chest pain 05/10/2014  . Abnormal nuclear stress test 05/10/2014  . Preoperative cardiovascular examination 04/05/2014  . S/P CABG x 3 10/05/2013  . DM2 (diabetes mellitus, type 2) (HCC) 10/05/2013  . Dyslipidemia 10/05/2013  . Aortic valve stenosis 10/05/2013  . Dizzy 03/27/2013  . CAD (coronary artery disease) 03/27/2013  . Dark stools 03/27/2013  . Knee joint pain 12/21/2012  . Low back pain 12/21/2012  . Aortic stenosis, mild 10/2012   Current Outpatient Medications on File Prior to Visit  Medication Sig Dispense Refill  . acetaminophen (TYLENOL) 650 MG CR tablet Take 1,300 mg by mouth in the morning and at bedtime.     . ALPRAZolam (XANAX) 1 MG tablet Take 1 mg by mouth at bedtime.     . apixaban (ELIQUIS) 5 MG TABS tablet Take 5 mg by mouth 2 (two) times daily.    . aspirin EC 81 MG tablet Take 81 mg by mouth daily. Swallow whole.    . atorvastatin (LIPITOR) 40 MG tablet Take 40 mg by mouth daily.     . bisacodyl (DULCOLAX) 5 MG EC tablet Take 5 mg by mouth daily as needed for moderate constipation.    . chlorthalidone (HYGROTON) 25 MG tablet Take 12.5 mg by mouth daily.    . diltiazem (CARDIZEM SR) 120  MG 12 hr capsule Take 1 capsule (120 mg total) by mouth 2 (two) times daily. 60 capsule 11  . diphenhydramine-acetaminophen (TYLENOL PM) 25-500 MG TABS Take 1 tablet by mouth at bedtime.     . EPINEPHrine (EPI-PEN) 0.3 mg/0.3 mL SOAJ injection Inject 0.3 mg into the muscle once as needed for anaphylaxis (for peanut allergy).     . fenofibrate (TRICOR) 145 MG tablet Take 1 tablet (145 mg total) by mouth daily. 90 tablet 3  . furosemide (LASIX) 40 MG tablet Take 40 mg by mouth daily.    . gabapentin (NEURONTIN) 300 MG capsule Take 600 mg by mouth 3 (three) times daily.     . gabapentin (NEURONTIN) 400 MG capsule Take 400 mg by mouth.    . icosapent Ethyl (VASCEPA) 1 g capsule Take 2 g by mouth 2 (two) times daily.    . irbesartan (AVAPRO) 300 MG tablet Take 1 tablet (300 mg total) by mouth daily. 30 tablet 5  . levofloxacin (LEVAQUIN) 500 MG tablet Take 1 tablet (500 mg total) by mouth daily. 7 tablet 0  . Loratadine 10 MG CAPS Take 10 mg by mouth daily.     . metFORMIN (GLUCOPHAGE-XR) 750 MG 24 hr tablet Take 750 mg by mouth in the morning and at bedtime.     . metoprolol (LOPRESSOR) 50 MG tablet Take 50 mg by mouth 3 (three)   times daily.    . Multiple Vitamin (MULTIVITAMIN WITH MINERALS) TABS Take 1 tablet by mouth daily.    . nitroGLYCERIN (NITROSTAT) 0.4 MG SL tablet Place 1 tablet (0.4 mg total) under the tongue every 5 (five) minutes as needed for chest pain. 25 tablet 3  . Omega-3 1000 MG CAPS Take 1,000 mg by mouth daily.    . pantoprazole (PROTONIX) 40 MG tablet Take 40 mg by mouth daily.     Marland Kitchen tiZANidine (ZANAFLEX) 4 MG tablet Take 4 mg by mouth at bedtime.     . TraMADol HCl 150 MG CP24 Take 150 mg by mouth at bedtime.     . traZODone (DESYREL) 100 MG tablet Take 100 mg by mouth at bedtime.     . TRUEplus Lancets 30G MISC     . venlafaxine XR (EFFEXOR-XR) 75 MG 24 hr capsule Take 75 mg by mouth daily.      No current facility-administered medications on file prior to visit.    Allergies  Allergen Reactions  . Other Anaphylaxis and Swelling    Nuts   . Peanut Oil Shortness Of Breath  . Penicillins Hives and Shortness Of Breath    Recent Results (from the past 2160 hour(s))  ECHOCARDIOGRAM COMPLETE     Status: None   Collection Time: 07/09/20  9:53 AM  Result Value Ref Range   S' Lateral 3.00 cm   Area-P 1/2 3.27 cm2   AV Area mean vel 0.97 cm2   AV Area VTI 0.96 cm2   AR max vel 1.02 cm2   AV Mean grad 25.3 mmHg   Ao pk vel 3.25 m/s   AV Peak grad 42.2 mmHg  Basic metabolic panel     Status: Abnormal   Collection Time: 07/24/20 11:02 AM  Result Value Ref Range   Glucose 125 (H) 65 - 99 mg/dL   BUN 21 8 - 27 mg/dL   Creatinine, Ser 0.98 0.57 - 1.00 mg/dL   GFR calc non Af Amer 57 (L) >59 mL/min/1.73   GFR calc Af Amer 66 >59 mL/min/1.73    Comment: **In accordance with recommendations from the NKF-ASN Task force,**   [facility] is in the process of updating its eGFR calculation to the   2021 CKD-EPI creatinine equation that estimates kidney function   without a race variable.    BUN/Creatinine Ratio 21 12 - 28   Sodium 142 134 - 144 mmol/L   Potassium 4.6 3.5 - 5.2 mmol/L   Chloride 100 96 - 106 mmol/L   CO2 26 20 - 29 mmol/L   Calcium 10.8 (H) 8.7 - 10.3 mg/dL  CBC     Status: Abnormal   Collection Time: 07/24/20 11:02 AM  Result Value Ref Range   WBC 7.0 3.4 - 10.8 x10E3/uL   RBC 3.76 (L) 3.77 - 5.28 x10E6/uL   Hemoglobin 11.8 11.1 - 15.9 g/dL   Hematocrit 35.6 34.0 - 46.6 %   MCV 95 79 - 97 fL   MCH 31.4 26.6 - 33.0 pg   MCHC 33.1 31.5 - 35.7 g/dL   RDW 13.1 11.7 - 15.4 %   Platelets 243 150 - 450 x10E3/uL  Magnesium     Status: None   Collection Time: 07/24/20 11:02 AM  Result Value Ref Range   Magnesium 2.0 1.6 - 2.3 mg/dL  Pro b natriuretic peptide (BNP)     Status: Abnormal   Collection Time: 07/24/20 11:02 AM  Result Value Ref Range   NT-Pro BNP 450 (  H) 0 - 301 pg/mL    Comment: The following cut-points have been suggested  for the use of proBNP for the diagnostic evaluation of heart failure (HF) in patients with acute dyspnea: Modality                     Age           Optimal Cut                            (years)            Point ------------------------------------------------------ Diagnosis (rule in HF)        <50            450 pg/mL                           50 - 75            900 pg/mL                               >75           1800 pg/mL Exclusion (rule out HF)  Age independent     300 pg/mL   SARS CORONAVIRUS 2 (TAT 6-24 HRS) Nasopharyngeal Nasopharyngeal Swab     Status: None   Collection Time: 08/04/20 10:38 AM   Specimen: Nasopharyngeal Swab  Result Value Ref Range   SARS Coronavirus 2 NEGATIVE NEGATIVE    Comment: (NOTE) SARS-CoV-2 target nucleic acids are NOT DETECTED.  The SARS-CoV-2 RNA is generally detectable in upper and lower respiratory specimens during the acute phase of infection. Negative results do not preclude SARS-CoV-2 infection, do not rule out co-infections with other pathogens, and should not be used as the sole basis for treatment or other patient management decisions. Negative results must be combined with clinical observations, patient history, and epidemiological information. The expected result is Negative.  Fact Sheet for Patients: SugarRoll.be  Fact Sheet for Healthcare Providers: https://www.woods-mathews.com/  This test is not yet approved or cleared by the [place] FDA and  has been authorized for detection and/or diagnosis of SARS-CoV-2 by FDA under an Emergency Use Authorization (EUA). This EUA will remain  in effect (meaning this test can be used) for the duration of the COVID-19 declaration under Se ction 564(b)(1) of the Act, 21 U.S.C. section 360bbb-3(b)(1), unless the authorization is terminated or revoked sooner.  Performed at [facility], [place] [address], [place], [place] [address]    Glucose, capillary     Status: Abnormal   Collection Time: 08/07/20  7:00 AM  Result Value Ref Range   Glucose-Capillary 140 (H) 70 - 99 mg/dL    Comment: Glucose reference range applies only to samples taken after fasting for at least 8 hours.   Comment 1 Notify RN   POCT I-Stat EG7     Status: Abnormal   Collection Time: 08/07/20  9:43 AM  Result Value Ref Range   pH, Ven 7.286 7.250 - 7.430   pCO2, Ven 61.5 (H) 44.0 - 60.0 mmHg   pO2, Ven 38.0 32.0 - 45.0 mmHg   Bicarbonate 29.3 (H) 20.0 - 28.0 mmol/L   TCO2 31 22 - 32 mmol/L   O2 Saturation 64.0 %   Acid-Base Excess 1.0 0.0 - 2.0 mmol/L   Sodium 144 135 - 145  mmol/L   Potassium 4.2 3.5 - 5.1 mmol/L   Calcium, Ion 1.31 1.15 - 1.40 mmol/L   HCT 34.0 (L) 36.0 - 46.0 %   Hemoglobin 11.6 (L) 12.0 - 15.0 g/dL   Sample type VENOUS    Comment NOTIFIED PHYSICIAN   I-STAT 7, (LYTES, BLD GAS, ICA, H+H)     Status: Abnormal   Collection Time: 08/07/20  9:55 AM  Result Value Ref Range   pH, Arterial 7.309 (L) 7.350 - 7.450   pCO2 arterial 58.9 (H) 32.0 - 48.0 mmHg   pO2, Arterial 100 83.0 - 108.0 mmHg   Bicarbonate 29.6 (H) 20.0 - 28.0 mmol/L   TCO2 31 22 - 32 mmol/L   O2 Saturation 97.0 %   Acid-Base Excess 2.0 0.0 - 2.0 mmol/L   Sodium 142 135 - 145 mmol/L   Potassium 4.3 3.5 - 5.1 mmol/L   Calcium, Ion 1.33 1.15 - 1.40 mmol/L   HCT 33.0 (L) 36.0 - 46.0 %   Hemoglobin 11.2 (L) 12.0 - 15.0 g/dL   Sample type ARTERIAL   WOUND CULTURE     Status: Abnormal   Collection Time: 08/19/20 12:52 PM   Specimen: Foot, Left; Wound   Wound Culture and sens  Result Value Ref Range   Gram Stain Result Final report    Organism ID, Bacteria Comment     Comment: No white blood cells seen.   Organism ID, Bacteria Comment     Comment: Many gram positive cocci.   Aerobic Bacterial Culture Final report (A)    Organism ID, Bacteria Staphylococcus aureus (A)     Comment: Based on susceptibility to oxacillin this isolate would be susceptible  to: *Penicillinase-stable penicillins, such as:   Cloxacillin, Dicloxacillin, Nafcillin *Beta-lactam combination agents, such as:   Amoxicillin-clavulanic acid, Ampicillin-sulbactam,   Piperacillin-tazobactam *Oral cephems, such as:   Cefaclor, Cefdinir, Cefpodoxime, Cefprozil, Cefuroxime,   Cephalexin, Loracarbef *Parenteral cephems, such as:   Cefazolin, Cefepime, Cefotaxime, Cefotetan, Ceftaroline,   Ceftizoxime, Ceftriaxone, Cefuroxime *Carbapenems, such as:   Doripenem, Ertapenem, Imipenem, Meropenem Heavy growth    Organism ID, Bacteria Mixed skin flora     Comment: Heavy growth   Antimicrobial Susceptibility Comment     Comment:       ** S = Susceptible; I = Intermediate; R = Resistant **                    P = Positive; N = Negative             MICS are expressed in micrograms per mL    Antibiotic                 RSLT#1    RSLT#2    RSLT#3    RSLT#4 Ciprofloxacin                  S Clindamycin                    S Erythromycin                   S Gentamicin                     S Levofloxacin                   S Linezolid                      S Moxifloxacin  S Oxacillin                      S Penicillin                     R Quinupristin/Dalfopristin      S Rifampin                       S Tetracycline                   S Trimethoprim/Sulfa             S Vancomycin                     S     Objective: There were no vitals filed for this visit.  General: Patient is awake, alert, oriented x 3 and in no acute distress.  Dermatology: Skin is warm and dry bilateral with a full thickness ulceration present posterior heel on left. Ulceration measures 0.5 cm x 0.3cm x 0.2 cm, same as previous. There is a Keratotic border with a granular base. The ulceration does not probe to bone. There is no malodor, no active drainage, resolved erythema, minimal focal edema.No acute signs of infection.  Minimal reactive callus sub met 5 on right and left 1st toe medial  aspect with no signs of infection.  Left 1st toe history of nail removal with small specks in the nail bed with no signs of infection.    Vascular: Dorsalis Pedis pulse = 1/4 Bilateral,  Posterior Tibial pulse = 1/4 Bilateral,  Capillary Fill Time < 5 seconds  Neurologic: Protective severely diminished bilateral.  Musculosketal: There is mild pain with palpation to ulcerated area that is improving compared to last visit. No pain with compression to calves bilateral. +Bunion and hammertoe deformity bilateral.  No results for input(s): GRAMSTAIN, LABORGA in the last 8760 hours.  Assessment and Plan:  Problem List Items Addressed This Visit      Endocrine   Diabetes mellitus (HCC)     Nervous and Auditory   Neuropathy    Other Visit Diagnoses    Ulcer of left heel and midfoot, unspecified ulcer stage (HCC)    -  Primary      -Examined patient and discussed the progression of the wound and treatment alternatives. -Cleansed wound and applied Iodosorb and bandaid dressing and instructed patient to continue with daily dressings at home using the same -Refrain from using peroxide to area -Avoid shoes that can rub the back of the heel -Advised patient to go to the ER or return to office if the wound worsens or if constitutional symptoms are present. -Patient to return to office in 2 weeks for follow up care and evaluation or sooner if problems arise.   , DPM 

## 2020-10-17 ENCOUNTER — Other Ambulatory Visit: Payer: Self-pay

## 2020-10-17 ENCOUNTER — Ambulatory Visit: Payer: Medicare HMO | Admitting: Sports Medicine

## 2020-10-17 ENCOUNTER — Encounter: Payer: Self-pay | Admitting: Sports Medicine

## 2020-10-17 DIAGNOSIS — L84 Corns and callosities: Secondary | ICD-10-CM

## 2020-10-17 DIAGNOSIS — G629 Polyneuropathy, unspecified: Secondary | ICD-10-CM

## 2020-10-17 DIAGNOSIS — L97429 Non-pressure chronic ulcer of left heel and midfoot with unspecified severity: Secondary | ICD-10-CM

## 2020-10-17 DIAGNOSIS — E0800 Diabetes mellitus due to underlying condition with hyperosmolarity without nonketotic hyperglycemic-hyperosmolar coma (NKHHC): Secondary | ICD-10-CM

## 2020-10-17 NOTE — Progress Notes (Signed)
Subjective: Julie Leblanc is a 75 y.o. female patient seen in office for follow-up evaluation of left heel ulcer  Patient reports that wound feels like is better with the use of Iodosorb 1 episode of drainage a little bit on yesterday. No other issues noted.  FBS not recorded, A1c unknown.  Patient Active Problem List   Diagnosis Date Noted  . Dyspnea on exertion 07/24/2020  . Chronic diastolic heart failure ([place]) 07/24/2020  . Diabetes mellitus ([place])   . GERD (gastroesophageal reflux disease)   . Hyperlipemia   . Hypertension   . Neuropathy   . Coronary artery disease involving coronary bypass graft of native heart without angina pectoris 02/09/2018  . Cellulitis of both lower extremities 06/17/2014  . Obesity, Class I, BMI 30.0-34.9 (see actual BMI) 06/14/2014  . Essential hypertension 05/10/2014  . Chest pain 05/10/2014  . Abnormal nuclear stress test 05/10/2014  . Preoperative cardiovascular examination 04/05/2014  . S/P CABG x 3 10/05/2013  . DM2 (diabetes mellitus, type 2) ([place]) 10/05/2013  . Dyslipidemia 10/05/2013  . Aortic valve stenosis 10/05/2013  . Dizzy 03/27/2013  . CAD (coronary artery disease) 03/27/2013  . Dark stools 03/27/2013  . Knee joint pain 12/21/2012  . Low back pain 12/21/2012  . Aortic stenosis, mild 10/2012   Current Outpatient Medications on File Prior to Visit  Medication Sig Dispense Refill  . acetaminophen (TYLENOL) 650 MG CR tablet Take 1,300 mg by mouth in the morning and at bedtime.     . Alcohol Swabs (B-D SINGLE USE SWABS REGULAR) PADS Apply topically.    . ALPRAZolam (XANAX) 1 MG tablet Take 1 mg by mouth at bedtime.     Marland Kitchen apixaban (ELIQUIS) 5 MG TABS tablet Take 5 mg by mouth 2 (two) times daily.    Marland Kitchen aspirin EC 81 MG tablet Take 81 mg by mouth daily. Swallow whole.    Marland Kitchen atorvastatin (LIPITOR) 40 MG tablet Take 40 mg by mouth daily.     . bisacodyl (DULCOLAX) 5 MG EC tablet Take 5 mg by mouth daily as needed for moderate constipation.     . celecoxib (CELEBREX) 200 MG capsule     . chlorthalidone (HYGROTON) 25 MG tablet Take 12.5 mg by mouth daily.    Marland Kitchen diltiazem (CARDIZEM CD) 120 MG 24 hr capsule     . diltiazem (CARDIZEM SR) 120 MG 12 hr capsule Take 1 capsule (120 mg total) by mouth 2 (two) times daily. 60 capsule 11  . diphenhydramine-acetaminophen (TYLENOL PM) 25-500 MG TABS Take 1 tablet by mouth at bedtime.     Marland Kitchen EPINEPHrine (EPI-PEN) 0.3 mg/0.3 mL SOAJ injection Inject 0.3 mg into the muscle once as needed for anaphylaxis (for peanut allergy).     . fenofibrate (TRICOR) 145 MG tablet Take 1 tablet (145 mg total) by mouth daily. 90 tablet 3  . fenofibrate micronized (LOFIBRA) 67 MG capsule     . furosemide (LASIX) 40 MG tablet Take 40 mg by mouth daily.    Marland Kitchen gabapentin (NEURONTIN) 300 MG capsule Take 600 mg by mouth 3 (three) times daily.     Marland Kitchen gabapentin (NEURONTIN) 400 MG capsule Take 400 mg by mouth.    Marland Kitchen HYDROcodone-acetaminophen (NORCO/VICODIN) 5-325 MG tablet Take 1 tablet by mouth daily as needed.    Marland Kitchen icosapent Ethyl (VASCEPA) 1 g capsule Take 2 g by mouth 2 (two) times daily.    . irbesartan (AVAPRO) 300 MG tablet Take 1 tablet (300 mg total) by mouth daily. [place]  tablet 5  . levofloxacin (LEVAQUIN) 500 MG tablet Take 1 tablet (500 mg total) by mouth daily. 7 tablet 0  . Loratadine 10 MG CAPS Take 10 mg by mouth daily.     . metFORMIN (GLUCOPHAGE-XR) 750 MG 24 hr tablet Take 750 mg by mouth in the morning and at bedtime.     . metoprolol (LOPRESSOR) 50 MG tablet Take 50 mg by mouth 3 (three) times daily.    . Multiple Vitamin (MULTIVITAMIN WITH MINERALS) TABS Take 1 tablet by mouth daily.    . nitroGLYCERIN (NITROSTAT) 0.4 MG SL tablet Place 1 tablet (0.4 mg total) under the tongue every 5 (five) minutes as needed for chest pain. 25 tablet 3  . Omega-3 1000 MG CAPS Take 1,000 mg by mouth daily.    . pantoprazole (PROTONIX) 40 MG tablet Take 40 mg by mouth daily.     Marland Kitchen tiZANidine (ZANAFLEX) 4 MG tablet Take 4 mg  by mouth at bedtime.     . TraMADol HCl 150 MG CP24 Take 150 mg by mouth at bedtime.     . traZODone (DESYREL) 100 MG tablet Take 100 mg by mouth at bedtime.     . TRUE METRIX BLOOD GLUCOSE TEST test strip SMARTSIG:Via Meter    . TRUEplus Lancets 30G MISC     . venlafaxine XR (EFFEXOR-XR) 75 MG 24 hr capsule Take 75 mg by mouth daily.      No current facility-administered medications on file prior to visit.   Allergies  Allergen Reactions  . Other Anaphylaxis and Swelling    Nuts   . Peanut Oil Shortness Of Breath  . Penicillins Hives and Shortness Of Breath    Recent Results (from the past 2160 hour(s))  Basic metabolic panel     Status: Abnormal   Collection Time: 07/24/20 11:02 AM  Result Value Ref Range   Glucose 125 (H) 65 - 99 mg/dL   BUN 21 8 - 27 mg/dL   Creatinine, Ser 0.98 0.57 - 1.00 mg/dL   GFR calc non Af Amer 57 (L) >59 mL/min/1.73   GFR calc Af Amer 66 >59 mL/min/1.73    Comment: **In accordance with recommendations from the NKF-ASN Task force,**   [facility] is in the process of updating its eGFR calculation to the   2021 CKD-EPI creatinine equation that estimates kidney function   without a race variable.    BUN/Creatinine Ratio 21 12 - 28   Sodium 142 134 - 144 mmol/L   Potassium 4.6 3.5 - 5.2 mmol/L   Chloride 100 96 - 106 mmol/L   CO2 26 20 - 29 mmol/L   Calcium 10.8 (H) 8.7 - 10.3 mg/dL  CBC     Status: Abnormal   Collection Time: 07/24/20 11:02 AM  Result Value Ref Range   WBC 7.0 3.4 - 10.8 x10E3/uL   RBC 3.76 (L) 3.77 - 5.28 x10E6/uL   Hemoglobin 11.8 11.1 - 15.9 g/dL   Hematocrit 35.6 34.0 - 46.6 %   MCV 95 79 - 97 fL   MCH 31.4 26.6 - 33.0 pg   MCHC 33.1 31.5 - 35.7 g/dL   RDW 13.1 11.7 - 15.4 %   Platelets 243 150 - 450 x10E3/uL  Magnesium     Status: None   Collection Time: 07/24/20 11:02 AM  Result Value Ref Range   Magnesium 2.0 1.6 - 2.3 mg/dL  Pro b natriuretic peptide (BNP)     Status: Abnormal   Collection Time: 07/24/20 11:02 AM  Result Value Ref Range   NT-Pro BNP 450 (H) 0 - 301 pg/mL    Comment: The following cut-points have been suggested for the use of proBNP for the diagnostic evaluation of heart failure (HF) in patients with acute dyspnea: Modality                     Age           Optimal Cut                            (years)            Point ------------------------------------------------------ Diagnosis (rule in HF)        <50            450 pg/mL                           50 - 75            900 pg/mL                               >75           1800 pg/mL Exclusion (rule out HF)  Age independent     300 pg/mL   SARS CORONAVIRUS 2 (TAT 6-24 HRS) Nasopharyngeal Nasopharyngeal Swab     Status: None   Collection Time: 08/04/20 10:38 AM   Specimen: Nasopharyngeal Swab  Result Value Ref Range   SARS Coronavirus 2 NEGATIVE NEGATIVE    Comment: (NOTE) SARS-CoV-2 target nucleic acids are NOT DETECTED.  The SARS-CoV-2 RNA is generally detectable in upper and lower respiratory specimens during the acute phase of infection. Negative results do not preclude SARS-CoV-2 infection, do not rule out co-infections with other pathogens, and should not be used as the sole basis for treatment or other patient management decisions. Negative results must be combined with clinical observations, patient history, and epidemiological information. The expected result is Negative.  Fact Sheet for Patients: SugarRoll.be  Fact Sheet for Healthcare Providers: https://www.woods-mathews.com/  This test is not yet approved or cleared by the [place] FDA and  has been authorized for detection and/or diagnosis of SARS-CoV-2 by FDA under an Emergency Use Authorization (EUA). This EUA will remain  in effect (meaning this test can be used) for the duration of the COVID-19 declaration under Se ction 564(b)(1) of the Act, 21 U.S.C. section 360bbb-3(b)(1), unless the authorization is  terminated or revoked sooner.  Performed at [facility], [place] [address] [place], [place] [address]   Glucose, capillary     Status: Abnormal   Collection Time: 08/07/20  7:00 AM  Result Value Ref Range   Glucose-Capillary 140 (H) 70 - 99 mg/dL    Comment: Glucose reference range applies only to samples taken after fasting for at least 8 hours.   Comment 1 Notify RN   POCT I-Stat EG7     Status: Abnormal   Collection Time: 08/07/20  9:43 AM  Result Value Ref Range   pH, Ven 7.286 7.250 - 7.430   pCO2, Ven 61.5 (H) 44.0 - 60.0 mmHg   pO2, Ven 38.0 32.0 - 45.0 mmHg   Bicarbonate 29.3 (H) 20.0 - 28.0 mmol/L   TCO2 31 22 - 32 mmol/L   O2 Saturation 64.0 %   Acid-Base Excess 1.0 0.0 - 2.0  mmol/L   Sodium 144 135 - 145 mmol/L   Potassium 4.2 3.5 - 5.1 mmol/L   Calcium, Ion 1.31 1.15 - 1.40 mmol/L   HCT 34.0 (L) 36.0 - 46.0 %   Hemoglobin 11.6 (L) 12.0 - 15.0 g/dL   Sample type VENOUS    Comment NOTIFIED PHYSICIAN   I-STAT 7, (LYTES, BLD GAS, ICA, H+H)     Status: Abnormal   Collection Time: 08/07/20  9:55 AM  Result Value Ref Range   pH, Arterial 7.309 (L) 7.350 - 7.450   pCO2 arterial 58.9 (H) 32.0 - 48.0 mmHg   pO2, Arterial 100 83.0 - 108.0 mmHg   Bicarbonate 29.6 (H) 20.0 - 28.0 mmol/L   TCO2 31 22 - 32 mmol/L   O2 Saturation 97.0 %   Acid-Base Excess 2.0 0.0 - 2.0 mmol/L   Sodium 142 135 - 145 mmol/L   Potassium 4.3 3.5 - 5.1 mmol/L   Calcium, Ion 1.33 1.15 - 1.40 mmol/L   HCT 33.0 (L) 36.0 - 46.0 %   Hemoglobin 11.2 (L) 12.0 - 15.0 g/dL   Sample type ARTERIAL   WOUND CULTURE     Status: Abnormal   Collection Time: 08/19/20 12:52 PM   Specimen: Foot, Left; Wound   Wound Culture and sens  Result Value Ref Range   Gram Stain Result Final report    Organism ID, Bacteria Comment     Comment: No white blood cells seen.   Organism ID, Bacteria Comment     Comment: Many gram positive cocci.   Aerobic Bacterial Culture Final report (A)    Organism ID, Bacteria  Staphylococcus aureus (A)     Comment: Based on susceptibility to oxacillin this isolate would be susceptible to: *Penicillinase-stable penicillins, such as:   Cloxacillin, Dicloxacillin, Nafcillin *Beta-lactam combination agents, such as:   Amoxicillin-clavulanic acid, Ampicillin-sulbactam,   Piperacillin-tazobactam *Oral cephems, such as:   Cefaclor, Cefdinir, Cefpodoxime, Cefprozil, Cefuroxime,   Cephalexin, Loracarbef *Parenteral cephems, such as:   Cefazolin, Cefepime, Cefotaxime, Cefotetan, Ceftaroline,   Ceftizoxime, Ceftriaxone, Cefuroxime *Carbapenems, such as:   Doripenem, Ertapenem, Imipenem, Meropenem Heavy growth    Organism ID, Bacteria Mixed skin flora     Comment: Heavy growth   Antimicrobial Susceptibility Comment     Comment:       ** S = Susceptible; I = Intermediate; R = Resistant **                    P = Positive; N = Negative             MICS are expressed in micrograms per mL    Antibiotic                 RSLT#1    RSLT#2    RSLT#3    RSLT#4 Ciprofloxacin                  S Clindamycin                    S Erythromycin                   S Gentamicin                     S Levofloxacin                   S Linezolid  S Moxifloxacin                   S Oxacillin                      S Penicillin                     R Quinupristin/Dalfopristin      S Rifampin                       S Tetracycline                   S Trimethoprim/Sulfa             S Vancomycin                     S     Objective: There were no vitals filed for this visit.  General: Patient is awake, alert, oriented x 3 and in no acute distress.  Dermatology: Skin is warm and dry bilateral with a full thickness ulceration present posterior heel on left. Ulceration measures 0.4 cm x 0.3cm x 0.2 cm, slightly smaller than previous. There is a Keratotic border with a granular base. The ulceration does not probe to bone. There is no malodor, no active drainage, resolved  erythema, minimal focal edema.No acute signs of infection.  Minimal reactive callus  left 1st toe medial aspect with no signs of infection.  Left 1st toe history of nail removal with small specks in the nail bed with no signs of infection.    Vascular: Dorsalis Pedis pulse = 1/4 Bilateral,  Posterior Tibial pulse = 1/4 Bilateral,  Capillary Fill Time < 5 seconds  Neurologic: Protective severely diminished bilateral.  Musculosketal: There is mild pain with palpation to ulcerated area that is improving compared to last visit. No pain with compression to calves bilateral. +Bunion and hammertoe deformity bilateral.  No results for input(s): GRAMSTAIN, LABORGA in the last 8760 hours.  Assessment and Plan:  Problem List Items Addressed This Visit      Endocrine   Diabetes mellitus ([place])     Nervous and Auditory   Neuropathy    Other Visit Diagnoses    Ulcer of left heel and midfoot, unspecified ulcer stage ([place])    -  Primary   Callus          -Examined patient and discussed the progression of the wound and treatment alternatives. -Mechanically debrided keratotic skin at wound using a sterile tissue nipper to healthy margins.  Hemostasis was achieved using manual pressure patient tolerated debridement procedure well without need for anesthesia.  Cleansed wound and applied Iodosorb and bandaid dressing and instructed patient to continue with daily dressings at home using the same -At no additional charge mechanically debrided callus using a sterile 15 blade to the left great toe -Refrain from using peroxide to area again this visit -Avoid shoes that can rub the back of the heel -Advised patient to go to the ER or return to office if the wound worsens or if constitutional symptoms are present. -Patient to return to office in 3-4 weeks for follow up care and evaluation or sooner if problems arise.  Landis Martins, DPM

## 2020-11-03 ENCOUNTER — Telehealth: Payer: Self-pay

## 2020-11-03 NOTE — Telephone Encounter (Signed)
  [facility]   MULTIDISCIPLINARY HEART VALVE TEAM  I contacted the pt to follow-up and see if she had arranged a dental evaluation with Dr Marcene Corning.  At this time the pt has not scheduled an appointment.  The pt states that she does not want to have any procedures performed while Covid numbers are high in the community.  The pt is scheduled to see her primary cardiologist, Dr Harriet Masson, on 2/23 for follow-up.  At this time the pt will require dental evaluation and possible treatment depending on findings before proceeding with TAVR evaluation.  I will forward my note to Dr Harriet Masson so that she can also discuss the importance of dental evaluation and the pt's diagnosis of Severe Aortic Stenosis. I asked the pt if I could call her back for an update and she said to call her in a month.

## 2020-11-12 ENCOUNTER — Ambulatory Visit: Payer: Medicare HMO | Admitting: Cardiology

## 2020-11-12 ENCOUNTER — Other Ambulatory Visit: Payer: Self-pay

## 2020-11-12 ENCOUNTER — Encounter: Payer: Self-pay | Admitting: Cardiology

## 2020-11-12 ENCOUNTER — Encounter: Payer: Self-pay | Admitting: Sports Medicine

## 2020-11-12 ENCOUNTER — Ambulatory Visit: Payer: Medicare HMO | Admitting: Sports Medicine

## 2020-11-12 VITALS — BP 136/60 | HR 74 | Ht 63.0 in | Wt 195.0 lb

## 2020-11-12 DIAGNOSIS — E1100 Type 2 diabetes mellitus with hyperosmolarity without nonketotic hyperglycemic-hyperosmolar coma (NKHHC): Secondary | ICD-10-CM

## 2020-11-12 DIAGNOSIS — E0800 Diabetes mellitus due to underlying condition with hyperosmolarity without nonketotic hyperglycemic-hyperosmolar coma (NKHHC): Secondary | ICD-10-CM | POA: Diagnosis not present

## 2020-11-12 DIAGNOSIS — I10 Essential (primary) hypertension: Secondary | ICD-10-CM | POA: Diagnosis not present

## 2020-11-12 DIAGNOSIS — I350 Nonrheumatic aortic (valve) stenosis: Secondary | ICD-10-CM | POA: Diagnosis not present

## 2020-11-12 DIAGNOSIS — E782 Mixed hyperlipidemia: Secondary | ICD-10-CM | POA: Diagnosis not present

## 2020-11-12 DIAGNOSIS — I480 Paroxysmal atrial fibrillation: Secondary | ICD-10-CM

## 2020-11-12 DIAGNOSIS — L97429 Non-pressure chronic ulcer of left heel and midfoot with unspecified severity: Secondary | ICD-10-CM | POA: Diagnosis not present

## 2020-11-12 DIAGNOSIS — G629 Polyneuropathy, unspecified: Secondary | ICD-10-CM

## 2020-11-12 DIAGNOSIS — I25810 Atherosclerosis of coronary artery bypass graft(s) without angina pectoris: Secondary | ICD-10-CM

## 2020-11-12 NOTE — Progress Notes (Signed)
Subjective: Julie Leblanc is a 75 y.o. female patient seen in office for follow-up evaluation of left heel ulcer  Patient reports that wound is doing good. No drainage. Reports that she goes to the heart doctor today. No other issues noted.  FBS not recorded, A1c unknown.  Patient Active Problem List   Diagnosis Date Noted  . Dyspnea on exertion 07/24/2020  . Chronic diastolic heart failure ([place]) 07/24/2020  . Diabetes mellitus ([place])   . GERD (gastroesophageal reflux disease)   . Hyperlipemia   . Hypertension   . Neuropathy   . Coronary artery disease involving coronary bypass graft of native heart without angina pectoris 02/09/2018  . Cellulitis of both lower extremities 06/17/2014  . Obesity, Class I, BMI 30.0-34.9 (see actual BMI) 06/14/2014  . Essential hypertension 05/10/2014  . Chest pain 05/10/2014  . Abnormal nuclear stress test 05/10/2014  . Preoperative cardiovascular examination 04/05/2014  . S/P CABG x 3 10/05/2013  . DM2 (diabetes mellitus, type 2) ([place]) 10/05/2013  . Dyslipidemia 10/05/2013  . Aortic valve stenosis 10/05/2013  . Dizzy 03/27/2013  . CAD (coronary artery disease) 03/27/2013  . Dark stools 03/27/2013  . Knee joint pain 12/21/2012  . Low back pain 12/21/2012  . Aortic stenosis, mild 10/2012   Current Outpatient Medications on File Prior to Visit  Medication Sig Dispense Refill  . acetaminophen (TYLENOL) 650 MG CR tablet Take 1,300 mg by mouth in the morning and at bedtime.     . Alcohol Swabs (B-D SINGLE USE SWABS REGULAR) PADS Apply topically.    . ALPRAZolam (XANAX) 1 MG tablet Take 1 mg by mouth at bedtime.     Marland Kitchen apixaban (ELIQUIS) 5 MG TABS tablet Take 5 mg by mouth 2 (two) times daily.    Marland Kitchen aspirin EC 81 MG tablet Take 81 mg by mouth daily. Swallow whole.    Marland Kitchen atorvastatin (LIPITOR) 40 MG tablet Take 40 mg by mouth daily.     . bisacodyl (DULCOLAX) 5 MG EC tablet Take 5 mg by mouth daily as needed for moderate constipation.    . celecoxib  (CELEBREX) 200 MG capsule     . chlorthalidone (HYGROTON) 25 MG tablet Take 12.5 mg by mouth daily.    Marland Kitchen diltiazem (CARDIZEM CD) 120 MG 24 hr capsule     . diltiazem (CARDIZEM SR) 120 MG 12 hr capsule Take 1 capsule (120 mg total) by mouth 2 (two) times daily. 60 capsule 11  . diphenhydramine-acetaminophen (TYLENOL PM) 25-500 MG TABS Take 1 tablet by mouth at bedtime.     Marland Kitchen EPINEPHrine (EPI-PEN) 0.3 mg/0.3 mL SOAJ injection Inject 0.3 mg into the muscle once as needed for anaphylaxis (for peanut allergy).     . fenofibrate (TRICOR) 145 MG tablet Take 1 tablet (145 mg total) by mouth daily. 90 tablet 3  . fenofibrate micronized (LOFIBRA) 67 MG capsule     . furosemide (LASIX) 40 MG tablet Take 40 mg by mouth daily.    Marland Kitchen gabapentin (NEURONTIN) 300 MG capsule Take 600 mg by mouth 3 (three) times daily.     Marland Kitchen gabapentin (NEURONTIN) 400 MG capsule Take 400 mg by mouth.    Marland Kitchen HYDROcodone-acetaminophen (NORCO/VICODIN) 5-325 MG tablet Take 1 tablet by mouth daily as needed.    Marland Kitchen icosapent Ethyl (VASCEPA) 1 g capsule Take 2 g by mouth 2 (two) times daily.    . irbesartan (AVAPRO) 300 MG tablet Take 1 tablet (300 mg total) by mouth daily. 30 tablet 5  .  levofloxacin (LEVAQUIN) 500 MG tablet Take 1 tablet (500 mg total) by mouth daily. 7 tablet 0  . Loratadine 10 MG CAPS Take 10 mg by mouth daily.     . metFORMIN (GLUCOPHAGE-XR) 750 MG 24 hr tablet Take 750 mg by mouth in the morning and at bedtime.     . metoprolol (LOPRESSOR) 50 MG tablet Take 50 mg by mouth 3 (three) times daily.    . Multiple Vitamin (MULTIVITAMIN WITH MINERALS) TABS Take 1 tablet by mouth daily.    . nitroGLYCERIN (NITROSTAT) 0.4 MG SL tablet Place 1 tablet (0.4 mg total) under the tongue every 5 (five) minutes as needed for chest pain. 25 tablet 3  . Omega-3 1000 MG CAPS Take 1,000 mg by mouth daily.    . pantoprazole (PROTONIX) 40 MG tablet Take 40 mg by mouth daily.     Marland Kitchen tiZANidine (ZANAFLEX) 4 MG tablet Take 4 mg by mouth at  bedtime.     . TraMADol HCl 150 MG CP24 Take 150 mg by mouth at bedtime.     . traZODone (DESYREL) 100 MG tablet Take 100 mg by mouth at bedtime.     . TRUE METRIX BLOOD GLUCOSE TEST test strip SMARTSIG:Via Meter    . TRUEplus Lancets 30G MISC     . venlafaxine XR (EFFEXOR-XR) 75 MG 24 hr capsule Take 75 mg by mouth daily.      No current facility-administered medications on file prior to visit.   Allergies  Allergen Reactions  . Other Anaphylaxis and Swelling    Nuts   . Peanut Oil Shortness Of Breath  . Penicillins Hives and Shortness Of Breath    Recent Results (from the past 2160 hour(s))  WOUND CULTURE     Status: Abnormal   Collection Time: 08/19/20 12:52 PM   Specimen: Foot, Left; Wound   Wound Culture and sens  Result Value Ref Range   Gram Stain Result Final report    Organism ID, Bacteria Comment     Comment: No white blood cells seen.   Organism ID, Bacteria Comment     Comment: Many gram positive cocci.   Aerobic Bacterial Culture Final report (A)    Organism ID, Bacteria Staphylococcus aureus (A)     Comment: Based on susceptibility to oxacillin this isolate would be susceptible to: *Penicillinase-stable penicillins, such as:   Cloxacillin, Dicloxacillin, Nafcillin *Beta-lactam combination agents, such as:   Amoxicillin-clavulanic acid, Ampicillin-sulbactam,   Piperacillin-tazobactam *Oral cephems, such as:   Cefaclor, Cefdinir, Cefpodoxime, Cefprozil, Cefuroxime,   Cephalexin, Loracarbef *Parenteral cephems, such as:   Cefazolin, Cefepime, Cefotaxime, Cefotetan, Ceftaroline,   Ceftizoxime, Ceftriaxone, Cefuroxime *Carbapenems, such as:   Doripenem, Ertapenem, Imipenem, Meropenem Heavy growth    Organism ID, Bacteria Mixed skin flora     Comment: Heavy growth   Antimicrobial Susceptibility Comment     Comment:       ** S = Susceptible; I = Intermediate; R = Resistant **                    P = Positive; N = Negative             MICS are expressed in  micrograms per mL    Antibiotic                 RSLT#1    RSLT#2    RSLT#3    RSLT#4 Ciprofloxacin  S Clindamycin                    S Erythromycin                   S Gentamicin                     S Levofloxacin                   S Linezolid                      S Moxifloxacin                   S Oxacillin                      S Penicillin                     R Quinupristin/Dalfopristin      S Rifampin                       S Tetracycline                   S Trimethoprim/Sulfa             S Vancomycin                     S     Objective: There were no vitals filed for this visit.  General: Patient is awake, alert, oriented x 3 and in no acute distress.  Dermatology: Skin is warm and dry bilateral with a full thickness ulceration present posterior heel on left. Ulceration measures 0.4 cm x 0.3cm x 0.1 cm, slightly smaller than previous. There is a Keratotic border with a granular base. The ulceration does not probe to bone. There is no malodor, no active drainage, resolved erythema, minimal focal edema.No acute signs of infection.  Minimal reactive callus  left 1st toe medial aspect with no signs of infection.  Left 1st toe history of nail removal with small specks in the nail bed with no signs of infection.    Vascular: Dorsalis Pedis pulse = 1/4 Bilateral,  Posterior Tibial pulse = 1/4 Bilateral,  Capillary Fill Time < 5 seconds  Neurologic: Protective severely diminished bilateral.  Musculosketal: There is mild pain with palpation to ulcerated area that is improving compared to last visit. No pain with compression to calves bilateral. +Bunion and hammertoe deformity bilateral.  No results for input(s): GRAMSTAIN, LABORGA in the last 8760 hours.  Assessment and Plan:  Problem List Items Addressed This Visit      Endocrine   Diabetes mellitus ([place])     Nervous and Auditory   Neuropathy    Other Visit Diagnoses    Ulcer of left heel and midfoot,  unspecified ulcer stage ([place])    -  Primary      -Examined patient and discussed the progression of the wound and treatment alternatives. -Cleansed wound and applied Iodosorb and bandaid dressing and instructed patient to continue with daily dressings at home using the same -Avoid shoes that can rub the back of the heel like previous  -Advised patient to go to the ER or return to office if the wound worsens or if constitutional symptoms are present. -Patient to return to office in 3-4 weeks for follow up care and evaluation  or sooner if problems arise.  Landis Martins, DPM

## 2020-11-12 NOTE — Patient Instructions (Signed)
Medication Instructions:  Your physician recommends that you continue on your current medications as directed. Please refer to the Current Medication list given to you today.  *If you need a refill on your cardiac medications before your next appointment, please call your pharmacy*   Lab Work: Your physician recommends that you return for lab work: [place], [place]  If you have labs (blood work) drawn today and your tests are completely normal, you will receive your results only by: Marland Kitchen MyChart Message (if you have MyChart) OR . A paper copy in the mail If you have any lab test that is abnormal or we need to change your treatment, we will call you to review the results.   Testing/Procedures: None   Follow-Up: At [facility], you and your health needs are our priority.  As part of our continuing mission to provide you with exceptional heart care, we have created designated Provider Care Teams.  These Care Teams include your primary Cardiologist (physician) and Advanced Practice Providers (APPs -  Physician Assistants and Nurse Practitioners) who all work together to provide you with the care you need, when you need it.  We recommend signing up for the patient portal called "MyChart".  Sign up information is provided on this After Visit Summary.  MyChart is used to connect with patients for Virtual Visits (Telemedicine).  Patients are able to view lab/test results, encounter notes, upcoming appointments, etc.  Non-urgent messages can be sent to your provider as well.   To learn more about what you can do with MyChart, go to [email or web address].    Your next appointment:   4 month(s)  The format for your next appointment:   In Person  Provider:   Berniece Salines, DO   Other Instructions

## 2020-11-12 NOTE — Progress Notes (Signed)
Cardiology Office Note:    Date:  11/12/2020   ID:  Julie Leblanc, DOB 06-05-1946, MRN [id number]  PCP:  Mateo Flow, MD  Cardiologist:  Berniece Salines, DO  Electrophysiologist:  None   Referring MD: Mateo Flow, MD     History of Present Illness:    Julie Leblanc is a 75 y.o. female with a hx of severe aortic stenosis, coronary artery disease status post CABG in 2007 and paroxysmal atrial fibrillation on Eliquis.  She is status post recent heart catheterization all her grafts were noted to be patent with severe aortic stenosis.  At her last visit she was still experiencing shortness of breath and was very sad as she was not able to afford her dental visits or get ready for her procedure.  And had been contemplating holding off.  She tells me that she did find a dentist in [place] who will accept her insurance but she still needs to get the co-pay and is working on her money.  She still is experiencing shortness of breath.  He tells me that she was on Eliquis and is waiting for a few days before she can pick it up from the pharmacy as she is currently in the donut hole.  Past Medical History:  Diagnosis Date  . Abnormal nuclear stress test 05/10/2014  . Aortic stenosis, mild 10/2012   mild AS by Echo , mild MR  . Aortic valve stenosis 10/05/2013  . CAD (coronary artery disease)    hx CABG 2007, LAST NUC EF 70%-no ischemia  . Cellulitis of both lower extremities 06/17/2014  . Chest pain 05/10/2014  . Coronary artery disease involving coronary bypass graft of native heart without angina pectoris 02/09/2018  . Dark stools 03/27/2013  . Diabetes mellitus ([place])   . Dizzy 03/27/2013  . DM2 (diabetes mellitus, type 2) ([place]) 10/05/2013  . Dyslipidemia 10/05/2013  . Essential hypertension 05/10/2014  . GERD (gastroesophageal reflux disease)   . Hyperlipemia   . Hypertension   . Knee joint pain 12/21/2012  . Low back pain 12/21/2012  . Neuropathy   . Obesity, Class I, BMI 30.0-34.9 (see  actual BMI) 06/14/2014  . Preoperative cardiovascular examination 04/05/2014  . S/P CABG x 3 10/05/2013   2007 - Dr. Lucianne Lei Trigt LIMA to LAD, SVG to diagonal and SVG to distal RCA     Past Surgical History:  Procedure Laterality Date  . ABDOMINAL HYSTERECTOMY  1990  . CORONARY ARTERY BYPASS GRAFT  01/24/2006   x 3,LIMA-LAD, VG-diag, VG-distal RCA  . GALLBLADDER SURGERY  1990's  . knee replacment  1990's  . LEFT HEART CATHETERIZATION WITH CORONARY/GRAFT ANGIOGRAM N/A 05/17/2014   Procedure: LEFT HEART CATHETERIZATION WITH Beatrix Fetters;  Surgeon: Jettie Booze, MD;  Location: [facility] CATH LAB;  Service: Cardiovascular;  Laterality: N/A;  . lung repair     s/p lap chole  . RIGHT/LEFT HEART CATH AND CORONARY/GRAFT ANGIOGRAPHY N/A 08/07/2020   Procedure: RIGHT/LEFT HEART CATH AND CORONARY/GRAFT ANGIOGRAPHY;  Surgeon: Sherren Mocha, MD;  Location: [facility] CV LAB;  Service: Cardiovascular;  Laterality: N/A;    Current Medications: Current Meds  Medication Sig  . acetaminophen (TYLENOL) 650 MG CR tablet Take 1,300 mg by mouth in the morning and at bedtime.  . Alcohol Swabs (B-D SINGLE USE SWABS REGULAR) PADS Apply topically.  . ALPRAZolam (XANAX) 1 MG tablet Take 1 mg by mouth at bedtime.  Marland Kitchen apixaban (ELIQUIS) 5 MG TABS tablet Take 5 mg by mouth 2 (  two) times daily.  Marland Kitchen aspirin EC 81 MG tablet Take 81 mg by mouth daily. Swallow whole.  Marland Kitchen atorvastatin (LIPITOR) 40 MG tablet Take 40 mg by mouth daily.   . bisacodyl (DULCOLAX) 5 MG EC tablet Take 5 mg by mouth daily as needed for moderate constipation.  . celecoxib (CELEBREX) 200 MG capsule   . chlorthalidone (HYGROTON) 25 MG tablet Take 12.5 mg by mouth daily.  Marland Kitchen diltiazem (CARDIZEM CD) 120 MG 24 hr capsule   . diltiazem (CARDIZEM SR) 120 MG 12 hr capsule Take 1 capsule (120 mg total) by mouth 2 (two) times daily.  . diphenhydramine-acetaminophen (TYLENOL PM) 25-500 MG TABS Take 1 tablet by mouth at bedtime.  Marland Kitchen EPINEPHrine  (EPI-PEN) 0.3 mg/0.3 mL SOAJ injection Inject 0.3 mg into the muscle once as needed for anaphylaxis (for peanut allergy).  . fenofibrate (TRICOR) 145 MG tablet Take 1 tablet (145 mg total) by mouth daily.  . fenofibrate micronized (LOFIBRA) 67 MG capsule   . furosemide (LASIX) 40 MG tablet Take 40 mg by mouth daily.  Marland Kitchen gabapentin (NEURONTIN) 300 MG capsule Take 600 mg by mouth 3 (three) times daily.   Marland Kitchen gabapentin (NEURONTIN) 400 MG capsule Take 400 mg by mouth.  Marland Kitchen HYDROcodone-acetaminophen (NORCO/VICODIN) 5-325 MG tablet Take 1 tablet by mouth daily as needed.  Marland Kitchen icosapent Ethyl (VASCEPA) 1 g capsule Take 2 g by mouth 2 (two) times daily.  . irbesartan (AVAPRO) 300 MG tablet Take 1 tablet (300 mg total) by mouth daily.  Marland Kitchen levofloxacin (LEVAQUIN) 500 MG tablet Take 1 tablet (500 mg total) by mouth daily.  . Loratadine 10 MG CAPS Take 10 mg by mouth daily.   . metFORMIN (GLUCOPHAGE-XR) 750 MG 24 hr tablet Take 750 mg by mouth in the morning and at bedtime.   . metoprolol (LOPRESSOR) 50 MG tablet Take 50 mg by mouth 3 (three) times daily.  . Multiple Vitamin (MULTIVITAMIN WITH MINERALS) TABS Take 1 tablet by mouth daily.  . nitroGLYCERIN (NITROSTAT) 0.4 MG SL tablet Place 1 tablet (0.4 mg total) under the tongue every 5 (five) minutes as needed for chest pain.  . Omega-3 1000 MG CAPS Take 1,000 mg by mouth daily.  . pantoprazole (PROTONIX) 40 MG tablet Take 40 mg by mouth daily.   Marland Kitchen tiZANidine (ZANAFLEX) 4 MG tablet Take 4 mg by mouth at bedtime.  . TraMADol HCl 150 MG CP24 Take 150 mg by mouth at bedtime.  . traZODone (DESYREL) 100 MG tablet Take 100 mg by mouth at bedtime.  . TRUE METRIX BLOOD GLUCOSE TEST test strip SMARTSIG:Via Meter  . TRUEplus Lancets 30G MISC   . venlafaxine XR (EFFEXOR-XR) 75 MG 24 hr capsule Take 75 mg by mouth daily.      Allergies:   Other, Peanut oil, and Penicillins   Social History   Socioeconomic History  . Marital status: Widowed    Spouse name: Not on  file  . Number of children: Not on file  . Years of education: Not on file  . Highest education level: Not on file  Occupational History  . Not on file  Tobacco Use  . Smoking status: Never Smoker  . Smokeless tobacco: Never Used  Substance and Sexual Activity  . Alcohol use: No  . Drug use: No  . Sexual activity: Not on file  Other Topics Concern  . Not on file  Social History Narrative  . Not on file   Social Determinants of Health   Financial Resource Strain:  Medium Risk  . Difficulty of Paying Living Expenses: Somewhat hard  Food Insecurity: No Food Insecurity  . Worried About Charity fundraiser in the Last Year: Never true  . Ran Out of Food in the Last Year: Never true  Transportation Needs: No Transportation Needs  . Lack of Transportation (Medical): No  . Lack of Transportation (Non-Medical): No  Physical Activity: Not on file  Stress: Not on file  Social Connections: Unknown  . Frequency of Communication with Friends and Family: More than three times a week  . Frequency of Social Gatherings with Friends and Family: More than three times a week  . Attends Religious Services: Not on file  . Active Member of Clubs or Organizations: No  . Attends Archivist Meetings: Never  . Marital Status: Widowed     Family History: The patient's family history includes Diabetes in her brother and mother; Diabetes (age of onset: 26) in her father; Heart disease (age of onset: 4) in her brother; Heart disease (age of onset: 4) in her mother.  ROS:   Review of Systems  Constitution: Negative for decreased appetite, fever and weight gain.  HENT: Negative for congestion, ear discharge, hoarse voice and sore throat.   Eyes: Negative for discharge, redness, vision loss in right eye and visual halos.  Cardiovascular: Negative for chest pain, dyspnea on exertion, leg swelling, orthopnea and palpitations.  Respiratory: Negative for cough, hemoptysis, shortness of breath and  snoring.   Endocrine: Negative for heat intolerance and polyphagia.  Hematologic/Lymphatic: Negative for bleeding problem. Does not bruise/bleed easily.  Skin: Negative for flushing, nail changes, rash and suspicious lesions.  Musculoskeletal: Negative for arthritis, joint pain, muscle cramps, myalgias, neck pain and stiffness.  Gastrointestinal: Negative for abdominal pain, bowel incontinence, diarrhea and excessive appetite.  Genitourinary: Negative for decreased libido, genital sores and incomplete emptying.  Neurological: Negative for brief paralysis, focal weakness, headaches and loss of balance.  Psychiatric/Behavioral: Negative for altered mental status, depression and suicidal ideas.  Allergic/Immunologic: Negative for HIV exposure and persistent infections.    EKGs/Labs/Other Studies Reviewed:    The following studies were reviewed today:   EKG: None today  Left heart catheterization 1. Multivessel CAD with moderate left main stenosis, moderate proximal LAD stenosis, nonobstructive LCx stenosis, and chronic RCA occlusion 2. S/P CABG with stable bypass graft anatomy - patent SVG-RCA, patent SVG-diagonal also supplying LAD, and atretic LIMA-LAD 3. Severe aortic stenosis with mean gradient 30 mmHg and calculated AVA 0.71 square cm 4. Mild pulmonary HTN Plan: Continued multidisciplinary evaluation for treatment of severe, symptomatic aortic stenosis  Recent Labs: 07/24/2020: BUN 21; Creatinine, Ser 0.98; Magnesium 2.0; NT-Pro BNP 450; Platelets 243 08/07/2020: Hemoglobin 11.2; Potassium 4.3; Sodium 142  Recent Lipid Panel No results found for: CHOL, TRIG, HDL, CHOLHDL, VLDL, LDLCALC, LDLDIRECT  Physical Exam:    VS:  BP 136/60   Pulse 74   Ht 5\' 3"  (1.6 m)   Wt 195 lb (88.5 kg)   SpO2 95%   BMI 34.54 kg/m     Wt Readings from Last 3 Encounters:  11/12/20 195 lb (88.5 kg)  08/12/20 197 lb 6.4 oz (89.5 kg)  08/07/20 196 lb (88.9 kg)     GEN: Well nourished, well  developed in no acute distress HEENT: Normal NECK: No JVD; No carotid bruits LYMPHATICS: No lymphadenopathy CARDIAC: S1S2 noted,RRR, no murmurs, rubs, gallops RESPIRATORY:  Clear to auscultation without rales, wheezing or rhonchi  ABDOMEN: Soft, non-tender, non-distended, +bowel sounds, no guarding.  EXTREMITIES: No edema, No cyanosis, no clubbing MUSCULOSKELETAL:  No deformity  SKIN: Warm and dry NEUROLOGIC:  Alert and oriented x 3, non-focal PSYCHIATRIC:  Normal affect, good insight  ASSESSMENT:    1. Symptomatic severe aortic stenosis    2. Essential hypertension   3. Coronary artery disease involving coronary bypass graft of native heart without angina pectoris   4. Type 2 diabetes mellitus with hyperosmolarity without coma, without long-term current use of insulin ([place])   5. Mixed hyperlipidemia   6. PAF (paroxysmal atrial fibrillation) ([facility])    PLAN:     He still is experiencing symptoms for aortic stenosis.  But she is pushing back any further intervention given the fact that she wants to wait on her dental evaluation.  No anginal symptoms at this time.  We will give the patient some Eliquis samples for atrial fibrillation until she is able to pick up her medication.  Continue her Cardizem.  Her blood pressure is acceptable in the office no changes will be made.  She is on diuretics will get blood work today for [employer] and mag.  The patient understands the need to lose weight with diet and exercise. We have discussed specific strategies for this.  The patient is in agreement with the above plan. The patient left the office in stable condition.  The patient will follow up in 4 months or sooner if needed.   Medication Adjustments/Labs and Tests Ordered: Current medicines are reviewed at length with the patient today.  Concerns regarding medicines are outlined above.  No orders of the defined types were placed in this encounter.  No orders of the defined types were placed  in this encounter.   There are no Patient Instructions on file for this visit.   Adopting a Healthy Lifestyle.  Know what a healthy weight is for you (roughly BMI <25) and aim to maintain this   Aim for 7+ servings of fruits and vegetables daily   65-80+ fluid ounces of water or unsweet tea for healthy kidneys   Limit to max 1 drink of alcohol per day; avoid smoking/tobacco   Limit animal fats in diet for cholesterol and heart health - choose grass fed whenever available   Avoid highly processed foods, and foods high in saturated/trans fats   Aim for low stress - take time to unwind and care for your mental health   Aim for 150 min of moderate intensity exercise weekly for heart health, and weights twice weekly for bone health   Aim for 7-9 hours of sleep daily   When it comes to diets, agreement about the perfect plan isnt easy to find, even among the experts. Experts at the [employer] developed an idea known as the Healthy Eating Plate. Just imagine a plate divided into logical, healthy portions.   The emphasis is on diet quality:   Load up on vegetables and fruits - one-half of your plate: Aim for color and variety, and remember that potatoes dont count.   Go for whole grains - one-quarter of your plate: Whole wheat, barley, wheat berries, quinoa, oats, brown rice, and foods made with them. If you want pasta, go with whole wheat pasta.   Protein power - one-quarter of your plate: Fish, chicken, beans, and nuts are all healthy, versatile protein sources. Limit red meat.   The diet, however, does go beyond the plate, offering a few other suggestions.   Use healthy plant oils, such as olive, canola, soy,  corn, sunflower and peanut. Check the labels, and avoid partially hydrogenated oil, which have unhealthy trans fats.   If youre thirsty, drink water. Coffee and tea are good in moderation, but skip sugary drinks and limit milk and dairy products to one  or two daily servings.   The type of carbohydrate in the diet is more important than the amount. Some sources of carbohydrates, such as vegetables, fruits, whole grains, and beans-are healthier than others.   Finally, stay active  Signed, Berniece Salines, DO  11/12/2020 2:30 PM    [place]

## 2020-11-13 ENCOUNTER — Telehealth: Payer: Self-pay | Admitting: Licensed Clinical Social Worker

## 2020-11-13 LAB — BASIC METABOLIC PANEL
BUN/Creatinine Ratio: 22 (ref 12–28)
BUN: 18 mg/dL (ref 8–27)
CO2: 25 mmol/L (ref 20–29)
Calcium: 10 mg/dL (ref 8.7–10.3)
Chloride: 102 mmol/L (ref 96–106)
Creatinine, Ser: 0.83 mg/dL (ref 0.57–1.00)
GFR calc Af Amer: 80 mL/min/{1.73_m2} (ref >59)
GFR calc non Af Amer: 70 mL/min/{1.73_m2} (ref >59)
Glucose: 172 mg/dL — ABNORMAL HIGH (ref 65–99)
Potassium: 4.5 mmol/L (ref 3.5–5.2)
Sodium: 144 mmol/L (ref 134–144)

## 2020-11-13 LAB — MAGNESIUM: Magnesium: 1.7 mg/dL (ref 1.6–2.3)

## 2020-11-13 NOTE — Progress Notes (Signed)
Heart and Vascular Care Navigation  11/13/2020  Julie Leblanc [birth year] [id number]  Reason for Referral:  Pt cannot afford copay for dental procedure; financial assistance                                                                                                  Assessment:                              LCSW familiar with pt from prior assessments in November and December 2021. Received a referral from Theodosia Quay, RN, w/ Structural Heart team. Per Dr. Jacqulyn Ducking visit w/ pt she expressed the copay for dental care is too expensive.   LCSW called pt this morning and spoke with her at [phone]. Re-introduced self, role, reason for call. Pt confirms that she had called the dental office but never inquired what her benefits were and never f/u on making an appt so she does not know what the actual cost/copay of the visit would be. I shared again w/ pt that if she called and scheduled a visit that the staff may be able to run her insurance benefits and give her a rough estimate for the visit. We could assist pt with costs that would free up for funds for the dental visit once we know what that cost would be. Pt states understanding but then shares that she thinks she is going to consider it after April as she has many expenses coming up. This was the same response that pt gave in December related to Christmas costs.   I again encouraged pt to at least call the office and schedule an appt and request that they run her benefits. Pt has my number for any additional questions.           HRT/VAS Care Coordination    Patients Home Cardiology Office [place]   Living arrangements for the past 2 months Single Family Home   Lives with: Self   Patient Current Insurance Coverage Managed Medicare   Patient Has Concern With Paying Medical Bills Yes   Patient Concerns With Medical Bills dental costs   Medical Bill Referrals: list of in network providers sent to pt   Does Patient Have  Prescription Coverage? Yes   Home Assistive Devices/Equipment CBG Meter; Cane (specify quad or straight)      Social History:                                                                             SDOH Screenings   Alcohol Screen: Not on file  Depression (YPP5-0): Not on file  Financial Resource Strain: Medium Risk  . Difficulty of Paying Living Expenses: Somewhat hard  Food Insecurity: No Food Insecurity  . Worried About Estate manager/land agent  of Food in the Last Year: Never true  . Ran Out of Food in the Last Year: Never true  Housing: Low Risk   . Last Housing Risk Score: 0  Physical Activity: Not on file  Social Connections: Unknown  . Frequency of Communication with Friends and Family: More than three times a week  . Frequency of Social Gatherings with Friends and Family: More than three times a week  . Attends Religious Services: Not on file  . Active Member of Clubs or Organizations: No  . Attends Archivist Meetings: Never  . Marital Status: Widowed  Stress: Not on file  Tobacco Use: Low Risk   . Smoking Tobacco Use: Never Smoker  . Smokeless Tobacco Use: Never Used  Transportation Needs: No Transportation Needs  . Lack of Transportation (Medical): No  . Lack of Transportation (Non-Medical): No   Follow-up plan:   LCSW reached out to [employer], RN, let her know about above conversation. Patient Care Fund cannot assist w/ copay costs (further confirmed w/ Kennyth Lose, Care Navigation team lead) but we can assist with other costs that may free up funds for this dental visit.   Until pt calls and schedules an appt/gets further information related to cost of visit it is unclear to this team what pt will need. Lauren aware, she will f/u with pt on March 14th. I shared w/ Lauren that pt may need to be provided further education about why dental assessment needed and what the risks are with having the TAVR vs not having the TAVR. Appreciate Lauren's assistance and expertise.   I  remain available as needed moving forward.

## 2020-11-21 DIAGNOSIS — U071 COVID-19: Secondary | ICD-10-CM | POA: Diagnosis not present

## 2020-11-22 DIAGNOSIS — I10 Essential (primary) hypertension: Secondary | ICD-10-CM | POA: Diagnosis not present

## 2020-11-22 DIAGNOSIS — S3991XA Unspecified injury of abdomen, initial encounter: Secondary | ICD-10-CM | POA: Diagnosis not present

## 2020-11-22 DIAGNOSIS — R609 Edema, unspecified: Secondary | ICD-10-CM | POA: Diagnosis not present

## 2020-11-22 DIAGNOSIS — I959 Hypotension, unspecified: Secondary | ICD-10-CM | POA: Diagnosis not present

## 2020-11-22 DIAGNOSIS — Z043 Encounter for examination and observation following other accident: Secondary | ICD-10-CM | POA: Diagnosis not present

## 2020-11-22 DIAGNOSIS — U071 COVID-19: Secondary | ICD-10-CM | POA: Diagnosis not present

## 2020-11-22 DIAGNOSIS — E1140 Type 2 diabetes mellitus with diabetic neuropathy, unspecified: Secondary | ICD-10-CM | POA: Diagnosis not present

## 2020-11-22 DIAGNOSIS — K219 Gastro-esophageal reflux disease without esophagitis: Secondary | ICD-10-CM | POA: Diagnosis not present

## 2020-11-22 DIAGNOSIS — W19XXXA Unspecified fall, initial encounter: Secondary | ICD-10-CM | POA: Diagnosis not present

## 2020-11-22 DIAGNOSIS — S3993XA Unspecified injury of pelvis, initial encounter: Secondary | ICD-10-CM | POA: Diagnosis not present

## 2020-11-22 DIAGNOSIS — I252 Old myocardial infarction: Secondary | ICD-10-CM | POA: Diagnosis not present

## 2020-11-22 DIAGNOSIS — I4891 Unspecified atrial fibrillation: Secondary | ICD-10-CM | POA: Diagnosis not present

## 2020-11-22 DIAGNOSIS — R9431 Abnormal electrocardiogram [ECG] [EKG]: Secondary | ICD-10-CM | POA: Diagnosis not present

## 2020-11-22 DIAGNOSIS — I2510 Atherosclerotic heart disease of native coronary artery without angina pectoris: Secondary | ICD-10-CM | POA: Diagnosis not present

## 2020-11-22 DIAGNOSIS — S0083XA Contusion of other part of head, initial encounter: Secondary | ICD-10-CM | POA: Diagnosis not present

## 2020-11-22 DIAGNOSIS — R778 Other specified abnormalities of plasma proteins: Secondary | ICD-10-CM | POA: Diagnosis not present

## 2020-11-22 DIAGNOSIS — R52 Pain, unspecified: Secondary | ICD-10-CM | POA: Diagnosis not present

## 2020-11-23 DIAGNOSIS — U071 COVID-19: Secondary | ICD-10-CM | POA: Diagnosis not present

## 2020-11-23 DIAGNOSIS — I4891 Unspecified atrial fibrillation: Secondary | ICD-10-CM | POA: Diagnosis not present

## 2020-11-23 DIAGNOSIS — I959 Hypotension, unspecified: Secondary | ICD-10-CM | POA: Diagnosis not present

## 2020-12-03 ENCOUNTER — Other Ambulatory Visit: Payer: Self-pay

## 2020-12-03 ENCOUNTER — Encounter: Payer: Self-pay | Admitting: Sports Medicine

## 2020-12-03 ENCOUNTER — Ambulatory Visit: Payer: Medicare HMO | Admitting: Sports Medicine

## 2020-12-03 DIAGNOSIS — G629 Polyneuropathy, unspecified: Secondary | ICD-10-CM | POA: Diagnosis not present

## 2020-12-03 DIAGNOSIS — E0800 Diabetes mellitus due to underlying condition with hyperosmolarity without nonketotic hyperglycemic-hyperosmolar coma (NKHHC): Secondary | ICD-10-CM | POA: Diagnosis not present

## 2020-12-03 DIAGNOSIS — L97429 Non-pressure chronic ulcer of left heel and midfoot with unspecified severity: Secondary | ICD-10-CM | POA: Diagnosis not present

## 2020-12-03 MED ORDER — SILVER SULFADIAZINE 1 % EX CREA: 1.0000 "application " | TOPICAL_CREAM | Freq: Every day | CUTANEOUS | 0 refills | Status: AC

## 2020-12-03 NOTE — Progress Notes (Signed)
Subjective: Julie Leblanc is a 75 y.o. female patient seen in office for follow-up evaluation of left heel ulcer  Patient reports that wound is doing about the same reports that if she lays her heel flat on the bed she has pain so when she was in the hospital for the 3 days she told the nurses to elevate her foot which helped reports that she was in the hospital after suffering a fall and bumping and bruising her face and head for about 3 days.  FBS not recorded, A1c unknown.  Patient Active Problem List   Diagnosis Date Noted  . Symptomatic severe aortic stenosis  11/12/2020  . PAF (paroxysmal atrial fibrillation) ([place]) 11/12/2020  . Dyspnea on exertion 07/24/2020  . Chronic diastolic heart failure ([place]) 07/24/2020  . Diabetes mellitus ([place])   . GERD (gastroesophageal reflux disease)   . Hyperlipemia   . Hypertension   . Neuropathy   . Coronary artery disease involving coronary bypass graft of native heart without angina pectoris 02/09/2018  . Cellulitis of both lower extremities 06/17/2014  . Obesity, Class I, BMI 30.0-34.9 (see actual BMI) 06/14/2014  . Essential hypertension 05/10/2014  . Chest pain 05/10/2014  . Abnormal nuclear stress test 05/10/2014  . Preoperative cardiovascular examination 04/05/2014  . S/P CABG x 3 10/05/2013  . DM2 (diabetes mellitus, type 2) ([place]) 10/05/2013  . Dyslipidemia 10/05/2013  . Aortic valve stenosis 10/05/2013  . Dizzy 03/27/2013  . CAD (coronary artery disease) 03/27/2013  . Dark stools 03/27/2013  . Knee joint pain 12/21/2012  . Low back pain 12/21/2012  . Aortic stenosis, mild 10/2012   Current Outpatient Medications on File Prior to Visit  Medication Sig Dispense Refill  . acetaminophen (TYLENOL) 650 MG CR tablet Take 1,300 mg by mouth in the morning and at bedtime.    . Alcohol Swabs (B-D SINGLE USE SWABS REGULAR) PADS Apply topically.    . ALPRAZolam (XANAX) 1 MG tablet Take 1 mg by mouth at bedtime.    Marland Kitchen apixaban (ELIQUIS) 5 MG  TABS tablet Take 5 mg by mouth 2 (two) times daily.    Marland Kitchen aspirin EC 81 MG tablet Take 81 mg by mouth daily. Swallow whole.    Marland Kitchen atorvastatin (LIPITOR) 40 MG tablet Take 40 mg by mouth daily.     . bisacodyl (DULCOLAX) 5 MG EC tablet Take 5 mg by mouth daily as needed for moderate constipation.    . celecoxib (CELEBREX) 200 MG capsule     . chlorthalidone (HYGROTON) 25 MG tablet Take 12.5 mg by mouth daily.    Marland Kitchen diltiazem (CARDIZEM CD) 120 MG 24 hr capsule     . diltiazem (CARDIZEM SR) 120 MG 12 hr capsule Take 1 capsule (120 mg total) by mouth 2 (two) times daily. 60 capsule 11  . diphenhydramine-acetaminophen (TYLENOL PM) 25-500 MG TABS Take 1 tablet by mouth at bedtime.    Marland Kitchen EPINEPHrine (EPI-PEN) 0.3 mg/0.3 mL SOAJ injection Inject 0.3 mg into the muscle once as needed for anaphylaxis (for peanut allergy).    . fenofibrate (TRICOR) 145 MG tablet Take 1 tablet (145 mg total) by mouth daily. 90 tablet 3  . fenofibrate micronized (LOFIBRA) 67 MG capsule     . furosemide (LASIX) 40 MG tablet Take 40 mg by mouth daily.    Marland Kitchen gabapentin (NEURONTIN) 300 MG capsule Take 600 mg by mouth 3 (three) times daily.     Marland Kitchen gabapentin (NEURONTIN) 400 MG capsule Take 400 mg by mouth.    Marland Kitchen  HYDROcodone-acetaminophen (NORCO/VICODIN) 5-325 MG tablet Take 1 tablet by mouth daily as needed.    Marland Kitchen icosapent Ethyl (VASCEPA) 1 g capsule Take 2 g by mouth 2 (two) times daily.    . irbesartan (AVAPRO) 300 MG tablet Take 1 tablet (300 mg total) by mouth daily. 30 tablet 5  . levofloxacin (LEVAQUIN) 500 MG tablet Take 1 tablet (500 mg total) by mouth daily. 7 tablet 0  . Loratadine 10 MG CAPS Take 10 mg by mouth daily.     . metFORMIN (GLUCOPHAGE-XR) 750 MG 24 hr tablet Take 750 mg by mouth in the morning and at bedtime.     . metoprolol (LOPRESSOR) 50 MG tablet Take 50 mg by mouth 3 (three) times daily.    . Multiple Vitamin (MULTIVITAMIN WITH MINERALS) TABS Take 1 tablet by mouth daily.    . nitroGLYCERIN (NITROSTAT) 0.4  MG SL tablet Place 1 tablet (0.4 mg total) under the tongue every 5 (five) minutes as needed for chest pain. 25 tablet 3  . Omega-3 1000 MG CAPS Take 1,000 mg by mouth daily.    . pantoprazole (PROTONIX) 40 MG tablet Take 40 mg by mouth daily.     Marland Kitchen tiZANidine (ZANAFLEX) 4 MG tablet Take 4 mg by mouth at bedtime.    . TraMADol HCl 150 MG CP24 Take 150 mg by mouth at bedtime.    . traZODone (DESYREL) 100 MG tablet Take 100 mg by mouth at bedtime.    . TRUE METRIX BLOOD GLUCOSE TEST test strip SMARTSIG:Via Meter    . TRUEplus Lancets 30G MISC     . venlafaxine XR (EFFEXOR-XR) 75 MG 24 hr capsule Take 75 mg by mouth daily.      No current facility-administered medications on file prior to visit.   Allergies  Allergen Reactions  . Other Anaphylaxis and Swelling    Nuts   . Peanut Oil Shortness Of Breath  . Penicillins Hives and Shortness Of Breath    Recent Results (from the past 2160 hour(s))  Basic metabolic panel     Status: Abnormal   Collection Time: 11/12/20  2:39 PM  Result Value Ref Range   Glucose 172 (H) 65 - 99 mg/dL   BUN 18 8 - 27 mg/dL   Creatinine, Ser 0.83 0.57 - 1.00 mg/dL    Comment:                **Effective November 17, 2020 [facility] will begin**                  reporting the 2021 CKD-EPI creatinine equation that                  estimates kidney function without a race variable.    GFR calc non Af Amer 70 >59 mL/min/1.73   GFR calc Af Amer 80 >59 mL/min/1.73    Comment: **In accordance with recommendations from the NKF-ASN Task force,**   [facility] is in the process of updating its eGFR calculation to the   2021 CKD-EPI creatinine equation that estimates kidney function   without a race variable.    BUN/Creatinine Ratio 22 12 - 28   Sodium 144 134 - 144 mmol/L   Potassium 4.5 3.5 - 5.2 mmol/L   Chloride 102 96 - 106 mmol/L   CO2 25 20 - 29 mmol/L   Calcium 10.0 8.7 - 10.3 mg/dL  Magnesium     Status: None   Collection Time: 11/12/20  2:39 PM  Result  Value Ref Range   Magnesium 1.7 1.6 - 2.3 mg/dL    Objective: There were no vitals filed for this visit.  General: Patient is awake, alert, oriented x 3 and in no acute distress.  Dermatology: Skin is warm and dry bilateral with a full thickness ulceration present posterior heel on left. Ulceration measures 0.4 cm x 0.3cm x 0.1 cm, same as previous visit.  There is a Keratotic border with a granular base. The ulceration does not probe to bone. There is no malodor, no active drainage, resolved erythema, minimal focal edema.No acute signs of infection.  Minimal reactive callus  left 1st toe medial aspect with no signs of infection.  Left 1st toe history of nail removal with small specks in the nail bed with no signs of infection.    Vascular: Dorsalis Pedis pulse = 1/4 Bilateral,  Posterior Tibial pulse = 1/4 Bilateral,  Capillary Fill Time < 5 seconds  Neurologic: Protective severely diminished bilateral.  Musculosketal: There is mild pain with palpation to ulcerated area that is improving compared to last visit. No pain with compression to calves bilateral. +Bunion and hammertoe deformity bilateral.  No results for input(s): GRAMSTAIN, LABORGA in the last 8760 hours.  Assessment and Plan:  Problem List Items Addressed This Visit      Endocrine   Diabetes mellitus ([place])     Nervous and Auditory   Neuropathy    Other Visit Diagnoses    Ulcer of left heel and midfoot, unspecified ulcer stage ([place])    -  Primary      -Examined patient and discussed the progression of the wound and treatment alternatives. -Cleansed wound and applied Silvadene and bandaid dressing and instructed patient to continue with daily dressings at home using the same -Prescription for Silvadene sent to [facility] -Avoid shoes that can rub the back of the heel like previous  -Advised patient to go to the ER or return to office if the wound worsens or if constitutional symptoms are present. -Patient to  return to office in 3 weeks for follow up care and evaluation or sooner if problems arise.  Advised patient if this fails to continue to improve may benefit from [place] or [facility]  Landis Martins, DPM

## 2020-12-05 ENCOUNTER — Telehealth: Payer: Self-pay

## 2020-12-05 NOTE — Telephone Encounter (Signed)
  [facility]   MULTIDISCIPLINARY HEART VALVE TEAM  I contacted the pt to discuss if she had spoken with Dr Ermalinda Memos office in regards to arranging dental evaluation.  At this time the pt has not contacted the office. The pt said she was diagnosed with Covid at the beginning of March and was hospitalized for 3-4 days at [facility].  The pt was on oxygen during the hospitalization and she underwent CT scans.  Per the pt an Echo was not performed. At this time the pt remains very weak, coughing and cannot walk from room to room without giving out.  She is not on home oxygen. I advised the pt that she needs to follow-up with her PCP in regards to symptoms.  The pt said she has a number of medical costs right now due to her recent hospitalization and that she would eventually follow-up with the dentist and she hung up.

## 2020-12-17 DIAGNOSIS — E782 Mixed hyperlipidemia: Secondary | ICD-10-CM | POA: Diagnosis not present

## 2020-12-17 DIAGNOSIS — I10 Essential (primary) hypertension: Secondary | ICD-10-CM | POA: Diagnosis not present

## 2020-12-24 ENCOUNTER — Encounter: Payer: Self-pay | Admitting: Sports Medicine

## 2020-12-24 ENCOUNTER — Other Ambulatory Visit: Payer: Self-pay

## 2020-12-24 ENCOUNTER — Ambulatory Visit: Payer: Medicare HMO | Admitting: Sports Medicine

## 2020-12-24 DIAGNOSIS — G629 Polyneuropathy, unspecified: Secondary | ICD-10-CM

## 2020-12-24 DIAGNOSIS — L97429 Non-pressure chronic ulcer of left heel and midfoot with unspecified severity: Secondary | ICD-10-CM

## 2020-12-24 DIAGNOSIS — E0800 Diabetes mellitus due to underlying condition with hyperosmolarity without nonketotic hyperglycemic-hyperosmolar coma (NKHHC): Secondary | ICD-10-CM

## 2020-12-24 NOTE — Progress Notes (Signed)
Subjective: STANA BAYON is a 75 y.o. female patient seen in office for follow-up evaluation of left heel ulcer  Patient reports that wound is doing good. Denies nausea, vomiting, fevers, chills, or any other symptoms at this time.   FBS not recorded, A1c unknown.  Patient Active Problem List   Diagnosis Date Noted  . Symptomatic severe aortic stenosis  11/12/2020  . PAF (paroxysmal atrial fibrillation) ([place]) 11/12/2020  . Dyspnea on exertion 07/24/2020  . Chronic diastolic heart failure ([place]) 07/24/2020  . Diabetes mellitus ([place])   . GERD (gastroesophageal reflux disease)   . Hyperlipemia   . Hypertension   . Neuropathy   . Coronary artery disease involving coronary bypass graft of native heart without angina pectoris 02/09/2018  . Cellulitis of both lower extremities 06/17/2014  . Obesity, Class I, BMI 30.0-34.9 (see actual BMI) 06/14/2014  . Essential hypertension 05/10/2014  . Chest pain 05/10/2014  . Abnormal nuclear stress test 05/10/2014  . Preoperative cardiovascular examination 04/05/2014  . S/P CABG x 3 10/05/2013  . DM2 (diabetes mellitus, type 2) ([place]) 10/05/2013  . Dyslipidemia 10/05/2013  . Aortic valve stenosis 10/05/2013  . Dizzy 03/27/2013  . CAD (coronary artery disease) 03/27/2013  . Dark stools 03/27/2013  . Knee joint pain 12/21/2012  . Low back pain 12/21/2012  . Aortic stenosis, mild 10/2012   Current Outpatient Medications on File Prior to Visit  Medication Sig Dispense Refill  . acetaminophen (TYLENOL) 650 MG CR tablet Take 1,300 mg by mouth in the morning and at bedtime.    . Alcohol Swabs (B-D SINGLE USE SWABS REGULAR) PADS Apply topically.    . ALPRAZolam (XANAX) 1 MG tablet Take 1 mg by mouth at bedtime.    Marland Kitchen apixaban (ELIQUIS) 5 MG TABS tablet Take 5 mg by mouth 2 (two) times daily.    Marland Kitchen aspirin EC 81 MG tablet Take 81 mg by mouth daily. Swallow whole.    Marland Kitchen atorvastatin (LIPITOR) 40 MG tablet Take 40 mg by mouth daily.     . bisacodyl  (DULCOLAX) 5 MG EC tablet Take 5 mg by mouth daily as needed for moderate constipation.    . celecoxib (CELEBREX) 200 MG capsule     . chlorthalidone (HYGROTON) 25 MG tablet Take 12.5 mg by mouth daily.    Marland Kitchen diltiazem (CARDIZEM CD) 120 MG 24 hr capsule     . diltiazem (CARDIZEM SR) 120 MG 12 hr capsule Take 1 capsule (120 mg total) by mouth 2 (two) times daily. 60 capsule 11  . diphenhydramine-acetaminophen (TYLENOL PM) 25-500 MG TABS Take 1 tablet by mouth at bedtime.    Marland Kitchen EPINEPHrine (EPI-PEN) 0.3 mg/0.3 mL SOAJ injection Inject 0.3 mg into the muscle once as needed for anaphylaxis (for peanut allergy).    . fenofibrate (TRICOR) 145 MG tablet Take 1 tablet (145 mg total) by mouth daily. 90 tablet 3  . fenofibrate micronized (LOFIBRA) 67 MG capsule     . furosemide (LASIX) 40 MG tablet Take 40 mg by mouth daily.    Marland Kitchen gabapentin (NEURONTIN) 300 MG capsule Take 600 mg by mouth 3 (three) times daily.     Marland Kitchen gabapentin (NEURONTIN) 400 MG capsule Take 400 mg by mouth.    Marland Kitchen HYDROcodone-acetaminophen (NORCO/VICODIN) 5-325 MG tablet Take 1 tablet by mouth daily as needed.    Marland Kitchen icosapent Ethyl (VASCEPA) 1 g capsule Take 2 g by mouth 2 (two) times daily.    . irbesartan (AVAPRO) 300 MG tablet Take 1 tablet (300  mg total) by mouth daily. 30 tablet 5  . levofloxacin (LEVAQUIN) 500 MG tablet Take 1 tablet (500 mg total) by mouth daily. 7 tablet 0  . Loratadine 10 MG CAPS Take 10 mg by mouth daily.     . metFORMIN (GLUCOPHAGE-XR) 750 MG 24 hr tablet Take 750 mg by mouth in the morning and at bedtime.     . metoprolol (LOPRESSOR) 50 MG tablet Take 50 mg by mouth 3 (three) times daily.    . Multiple Vitamin (MULTIVITAMIN WITH MINERALS) TABS Take 1 tablet by mouth daily.    . nitroGLYCERIN (NITROSTAT) 0.4 MG SL tablet Place 1 tablet (0.4 mg total) under the tongue every 5 (five) minutes as needed for chest pain. 25 tablet 3  . Omega-3 1000 MG CAPS Take 1,000 mg by mouth daily.    . pantoprazole (PROTONIX) 40 MG  tablet Take 40 mg by mouth daily.     . silver sulfADIAZINE (SILVADENE) 1 % cream Apply 1 application topically daily. 50 g 0  . tiZANidine (ZANAFLEX) 4 MG tablet Take 4 mg by mouth at bedtime.    . TraMADol HCl 150 MG CP24 Take 150 mg by mouth at bedtime.    . traZODone (DESYREL) 100 MG tablet Take 100 mg by mouth at bedtime.    . TRUE METRIX BLOOD GLUCOSE TEST test strip SMARTSIG:Via Meter    . TRUEplus Lancets 30G MISC     . venlafaxine XR (EFFEXOR-XR) 75 MG 24 hr capsule Take 75 mg by mouth daily.      No current facility-administered medications on file prior to visit.   Allergies  Allergen Reactions  . Other Anaphylaxis and Swelling    Nuts   . Peanut Oil Shortness Of Breath  . Penicillins Hives and Shortness Of Breath    Recent Results (from the past 2160 hour(s))  Basic metabolic panel     Status: Abnormal   Collection Time: 11/12/20  2:39 PM  Result Value Ref Range   Glucose 172 (H) 65 - 99 mg/dL   BUN 18 8 - 27 mg/dL   Creatinine, Ser 0.83 0.57 - 1.00 mg/dL    Comment:                **Effective November 17, 2020 [facility] will begin**                  reporting the 2021 CKD-EPI creatinine equation that                  estimates kidney function without a race variable.    GFR calc non Af Amer 70 >59 mL/min/1.73   GFR calc Af Amer 80 >59 mL/min/1.73    Comment: **In accordance with recommendations from the NKF-ASN Task force,**   [facility] is in the process of updating its eGFR calculation to the   2021 CKD-EPI creatinine equation that estimates kidney function   without a race variable.    BUN/Creatinine Ratio 22 12 - 28   Sodium 144 134 - 144 mmol/L   Potassium 4.5 3.5 - 5.2 mmol/L   Chloride 102 96 - 106 mmol/L   CO2 25 20 - 29 mmol/L   Calcium 10.0 8.7 - 10.3 mg/dL  Magnesium     Status: None   Collection Time: 11/12/20  2:39 PM  Result Value Ref Range   Magnesium 1.7 1.6 - 2.3 mg/dL    Objective: There were no vitals filed for this visit.  General:  Patient is awake, alert,  oriented x 3 and in no acute distress.  Dermatology: Skin is warm and dry bilateral with a full thickness ulceration present posterior heel on left. Ulceration measures 0.1 cm x 0.1cm x 0.1 cm, smaller than previous.  There is a Keratotic border with a granular base. The ulceration does not probe to bone. There is no malodor, no active drainage, resolved erythema, minimal focal edema.No acute signs of infection.  Minimal reactive callus  left 1st toe medial aspect with no signs of infection.  Left 1st toe history of nail removal with small specks in the nail bed with no signs of infection.    Vascular: Dorsalis Pedis pulse = 1/4 Bilateral,  Posterior Tibial pulse = 1/4 Bilateral,  Capillary Fill Time < 5 seconds  Neurologic: Protective severely diminished bilateral.  Musculosketal: There is mild pain with palpation to ulcerated area that is improving compared to last visit. No pain with compression to calves bilateral. +Bunion and hammertoe deformity bilateral.  No results for input(s): GRAMSTAIN, LABORGA in the last 8760 hours.  Assessment and Plan:  Problem List Items Addressed This Visit      Endocrine   Diabetes mellitus ([place])     Nervous and Auditory   Neuropathy    Other Visit Diagnoses    Ulcer of left heel and midfoot, unspecified ulcer stage ([place])    -  Primary      -Examined patient and discussed the progression of the wound and treatment alternatives. -Cleansed wound and applied Silvadene and bandaid dressing and instructed patient to continue with daily dressings at home using the same  -Advised patient to go to the ER or return to office if the wound worsens or if constitutional symptoms are present. -Patient to return to office in 3 weeks for follow up care and evaluation or sooner if problems arise. If doing well at next visit may d/c silvadene.   Landis Martins, DPM

## 2021-01-14 ENCOUNTER — Ambulatory Visit: Payer: Medicare HMO | Admitting: Sports Medicine

## 2021-01-14 ENCOUNTER — Other Ambulatory Visit: Payer: Self-pay

## 2021-01-14 ENCOUNTER — Encounter: Payer: Self-pay | Admitting: Sports Medicine

## 2021-01-14 DIAGNOSIS — E0800 Diabetes mellitus due to underlying condition with hyperosmolarity without nonketotic hyperglycemic-hyperosmolar coma (NKHHC): Secondary | ICD-10-CM | POA: Diagnosis not present

## 2021-01-14 DIAGNOSIS — G629 Polyneuropathy, unspecified: Secondary | ICD-10-CM | POA: Diagnosis not present

## 2021-01-14 DIAGNOSIS — L97429 Non-pressure chronic ulcer of left heel and midfoot with unspecified severity: Secondary | ICD-10-CM

## 2021-01-14 NOTE — Progress Notes (Signed)
Subjective: Julie Leblanc is a 75 y.o. female patient seen in office for follow-up evaluation of left heel ulcer.  Patient reports that wound is doing good has not noticed any drainage on her Band-Aid. Denies nausea, vomiting, fevers, chills, or any other symptoms at this time.   FBS not recorded, A1c unknown.  Patient Active Problem List   Diagnosis Date Noted  . Symptomatic severe aortic stenosis  11/12/2020  . PAF (paroxysmal atrial fibrillation) ([place]) 11/12/2020  . Dyspnea on exertion 07/24/2020  . Chronic diastolic heart failure ([place]) 07/24/2020  . Diabetes mellitus ([place])   . GERD (gastroesophageal reflux disease)   . Hyperlipemia   . Hypertension   . Neuropathy   . Coronary artery disease involving coronary bypass graft of native heart without angina pectoris 02/09/2018  . Cellulitis of both lower extremities 06/17/2014  . Obesity, Class I, BMI 30.0-34.9 (see actual BMI) 06/14/2014  . Essential hypertension 05/10/2014  . Chest pain 05/10/2014  . Abnormal nuclear stress test 05/10/2014  . Preoperative cardiovascular examination 04/05/2014  . S/P CABG x 3 10/05/2013  . DM2 (diabetes mellitus, type 2) ([place]) 10/05/2013  . Dyslipidemia 10/05/2013  . Aortic valve stenosis 10/05/2013  . Dizzy 03/27/2013  . CAD (coronary artery disease) 03/27/2013  . Dark stools 03/27/2013  . Knee joint pain 12/21/2012  . Low back pain 12/21/2012  . Aortic stenosis, mild 10/2012   Current Outpatient Medications on File Prior to Visit  Medication Sig Dispense Refill  . acetaminophen (TYLENOL) 650 MG CR tablet Take 1,300 mg by mouth in the morning and at bedtime.    . Alcohol Swabs (B-D SINGLE USE SWABS REGULAR) PADS Apply topically.    . ALPRAZolam (XANAX) 1 MG tablet Take 1 mg by mouth at bedtime.    Marland Kitchen apixaban (ELIQUIS) 5 MG TABS tablet Take 5 mg by mouth 2 (two) times daily.    Marland Kitchen aspirin EC 81 MG tablet Take 81 mg by mouth daily. Swallow whole.    Marland Kitchen atorvastatin (LIPITOR) 40 MG tablet Take  40 mg by mouth daily.     . bisacodyl (DULCOLAX) 5 MG EC tablet Take 5 mg by mouth daily as needed for moderate constipation.    . celecoxib (CELEBREX) 200 MG capsule     . chlorthalidone (HYGROTON) 25 MG tablet Take 12.5 mg by mouth daily.    Marland Kitchen diltiazem (CARDIZEM CD) 120 MG 24 hr capsule     . diltiazem (CARDIZEM SR) 120 MG 12 hr capsule Take 1 capsule (120 mg total) by mouth 2 (two) times daily. 60 capsule 11  . diphenhydramine-acetaminophen (TYLENOL PM) 25-500 MG TABS Take 1 tablet by mouth at bedtime.    Marland Kitchen EPINEPHrine (EPI-PEN) 0.3 mg/0.3 mL SOAJ injection Inject 0.3 mg into the muscle once as needed for anaphylaxis (for peanut allergy).    . fenofibrate (TRICOR) 145 MG tablet Take 1 tablet (145 mg total) by mouth daily. 90 tablet 3  . fenofibrate micronized (LOFIBRA) 67 MG capsule     . furosemide (LASIX) 40 MG tablet Take 40 mg by mouth daily.    Marland Kitchen gabapentin (NEURONTIN) 300 MG capsule Take 600 mg by mouth 3 (three) times daily.     Marland Kitchen gabapentin (NEURONTIN) 400 MG capsule Take 400 mg by mouth.    Marland Kitchen HYDROcodone-acetaminophen (NORCO/VICODIN) 5-325 MG tablet Take 1 tablet by mouth daily as needed.    Marland Kitchen icosapent Ethyl (VASCEPA) 1 g capsule Take 2 g by mouth 2 (two) times daily.    . irbesartan (  AVAPRO) 300 MG tablet Take 1 tablet (300 mg total) by mouth daily. 30 tablet 5  . levofloxacin (LEVAQUIN) 500 MG tablet Take 1 tablet (500 mg total) by mouth daily. 7 tablet 0  . Loratadine 10 MG CAPS Take 10 mg by mouth daily.     . metFORMIN (GLUCOPHAGE-XR) 750 MG 24 hr tablet Take 750 mg by mouth in the morning and at bedtime.     . metoprolol (LOPRESSOR) 50 MG tablet Take 50 mg by mouth 3 (three) times daily.    . Multiple Vitamin (MULTIVITAMIN WITH MINERALS) TABS Take 1 tablet by mouth daily.    . nitroGLYCERIN (NITROSTAT) 0.4 MG SL tablet Place 1 tablet (0.4 mg total) under the tongue every 5 (five) minutes as needed for chest pain. 25 tablet 3  . Omega-3 1000 MG CAPS Take 1,000 mg by mouth  daily.    . pantoprazole (PROTONIX) 40 MG tablet Take 40 mg by mouth daily.     . silver sulfADIAZINE (SILVADENE) 1 % cream Apply 1 application topically daily. 50 g 0  . tiZANidine (ZANAFLEX) 4 MG tablet Take 4 mg by mouth at bedtime.    . TraMADol HCl 150 MG CP24 Take 150 mg by mouth at bedtime.    . traZODone (DESYREL) 100 MG tablet Take 100 mg by mouth at bedtime.    . TRUE METRIX BLOOD GLUCOSE TEST test strip SMARTSIG:Via Meter    . TRUEplus Lancets 30G MISC     . venlafaxine XR (EFFEXOR-XR) 75 MG 24 hr capsule Take 75 mg by mouth daily.      No current facility-administered medications on file prior to visit.   Allergies  Allergen Reactions  . Other Anaphylaxis and Swelling    Nuts   . Peanut Oil Shortness Of Breath  . Penicillins Hives and Shortness Of Breath    Recent Results (from the past 2160 hour(s))  Basic metabolic panel     Status: Abnormal   Collection Time: 11/12/20  2:39 PM  Result Value Ref Range   Glucose 172 (H) 65 - 99 mg/dL   BUN 18 8 - 27 mg/dL   Creatinine, Ser 0.83 0.57 - 1.00 mg/dL    Comment:                **Effective November 17, 2020 [facility] will begin**                  reporting the 2021 CKD-EPI creatinine equation that                  estimates kidney function without a race variable.    GFR calc non Af Amer 70 >59 mL/min/1.73   GFR calc Af Amer 80 >59 mL/min/1.73    Comment: **In accordance with recommendations from the NKF-ASN Task force,**   [facility] is in the process of updating its eGFR calculation to the   2021 CKD-EPI creatinine equation that estimates kidney function   without a race variable.    BUN/Creatinine Ratio 22 12 - 28   Sodium 144 134 - 144 mmol/L   Potassium 4.5 3.5 - 5.2 mmol/L   Chloride 102 96 - 106 mmol/L   CO2 25 20 - 29 mmol/L   Calcium 10.0 8.7 - 10.3 mg/dL  Magnesium     Status: None   Collection Time: 11/12/20  2:39 PM  Result Value Ref Range   Magnesium 1.7 1.6 - 2.3 mg/dL    Objective: There were no  vitals filed for  this visit.  General: Patient is awake, alert, oriented x 3 and in no acute distress.  Dermatology: Skin is warm and dry bilateral with a now scabbed over wound at the left posterior heel.  There is no malodor, no active drainage, resolved erythema, minimal focal edema.No acute signs of infection.  Minimal reactive callus  left 1st toe medial aspect with no signs of infection.  Left 1st toe history of nail removal with small specks in the nail bed with no signs of infection.    Vascular: Dorsalis Pedis pulse = 1/4 Bilateral,  Posterior Tibial pulse = 1/4 Bilateral,  Capillary Fill Time < 5 seconds  Neurologic: Protective severely diminished bilateral.  Musculosketal: There is mild pain with palpation to ulcerated area that is improving compared to last visit. No pain with compression to calves bilateral. +Bunion and hammertoe deformity bilateral.  No results for input(s): GRAMSTAIN, LABORGA in the last 8760 hours.  Assessment and Plan:  Problem List Items Addressed This Visit      Endocrine   Diabetes mellitus ([place])     Nervous and Auditory   Neuropathy    Other Visit Diagnoses    Ulcer of left heel and midfoot, unspecified ulcer stage ([place])    -  Primary   prematurely healed      -Examined patient and discussed the progression of the wound and treatment alternatives. -Previous ulcer is now healed -Advised patient to continue with protective Band-Aid to the area -Advised patient to go to the ER or return to office if the wound worsens or if constitutional symptoms are present. -Patient to return to office in 4 weeks and at this visit if she is doing well may discontinue Band-Aid.  Landis Martins, DPM

## 2021-02-11 ENCOUNTER — Other Ambulatory Visit: Payer: Self-pay

## 2021-02-11 ENCOUNTER — Ambulatory Visit: Payer: Medicare HMO | Admitting: Sports Medicine

## 2021-02-11 ENCOUNTER — Encounter: Payer: Self-pay | Admitting: Sports Medicine

## 2021-02-11 DIAGNOSIS — G629 Polyneuropathy, unspecified: Secondary | ICD-10-CM | POA: Diagnosis not present

## 2021-02-11 DIAGNOSIS — E0800 Diabetes mellitus due to underlying condition with hyperosmolarity without nonketotic hyperglycemic-hyperosmolar coma (NKHHC): Secondary | ICD-10-CM

## 2021-02-11 DIAGNOSIS — I739 Peripheral vascular disease, unspecified: Secondary | ICD-10-CM | POA: Diagnosis not present

## 2021-02-11 DIAGNOSIS — L97429 Non-pressure chronic ulcer of left heel and midfoot with unspecified severity: Secondary | ICD-10-CM

## 2021-02-11 NOTE — Progress Notes (Signed)
Subjective: Julie Leblanc is a 75 y.o. female patient seen in office for follow-up evaluation of left heel ulcer.  Patient reports that wound is doing good now did have a episode where her feet and legs swell up after church and noticed that there was some drainage coming from the left heel resumed using her Silvadene and now it has healed up.  Currently using a protective Band-Aid to the area. Denies nausea, vomiting, fevers, chills, or any other symptoms at this time.   FBS not recorded, A1c unknown.  Patient Active Problem List   Diagnosis Date Noted  . Symptomatic severe aortic stenosis  11/12/2020  . PAF (paroxysmal atrial fibrillation) ([place]) 11/12/2020  . Dyspnea on exertion 07/24/2020  . Chronic diastolic heart failure ([place]) 07/24/2020  . Diabetes mellitus ([place])   . GERD (gastroesophageal reflux disease)   . Hyperlipemia   . Hypertension   . Neuropathy   . Coronary artery disease involving coronary bypass graft of native heart without angina pectoris 02/09/2018  . Cellulitis of both lower extremities 06/17/2014  . Obesity, Class I, BMI 30.0-34.9 (see actual BMI) 06/14/2014  . Essential hypertension 05/10/2014  . Chest pain 05/10/2014  . Abnormal nuclear stress test 05/10/2014  . Preoperative cardiovascular examination 04/05/2014  . S/P CABG x 3 10/05/2013  . DM2 (diabetes mellitus, type 2) ([place]) 10/05/2013  . Dyslipidemia 10/05/2013  . Aortic valve stenosis 10/05/2013  . Dizzy 03/27/2013  . CAD (coronary artery disease) 03/27/2013  . Dark stools 03/27/2013  . Knee joint pain 12/21/2012  . Low back pain 12/21/2012  . Aortic stenosis, mild 10/2012   Current Outpatient Medications on File Prior to Visit  Medication Sig Dispense Refill  . acetaminophen (TYLENOL) 650 MG CR tablet Take 1,300 mg by mouth in the morning and at bedtime.    . Alcohol Swabs (B-D SINGLE USE SWABS REGULAR) PADS Apply topically.    . ALPRAZolam (XANAX) 1 MG tablet Take 1 mg by mouth at bedtime.     Marland Kitchen apixaban (ELIQUIS) 5 MG TABS tablet Take 5 mg by mouth 2 (two) times daily.    Marland Kitchen aspirin EC 81 MG tablet Take 81 mg by mouth daily. Swallow whole.    Marland Kitchen atorvastatin (LIPITOR) 40 MG tablet Take 40 mg by mouth daily.     . bisacodyl (DULCOLAX) 5 MG EC tablet Take 5 mg by mouth daily as needed for moderate constipation.    . celecoxib (CELEBREX) 200 MG capsule     . chlorthalidone (HYGROTON) 25 MG tablet Take 12.5 mg by mouth daily.    Marland Kitchen diltiazem (CARDIZEM CD) 120 MG 24 hr capsule     . diltiazem (CARDIZEM SR) 120 MG 12 hr capsule Take 1 capsule (120 mg total) by mouth 2 (two) times daily. 60 capsule 11  . diphenhydramine-acetaminophen (TYLENOL PM) 25-500 MG TABS Take 1 tablet by mouth at bedtime.    Marland Kitchen EPINEPHrine (EPI-PEN) 0.3 mg/0.3 mL SOAJ injection Inject 0.3 mg into the muscle once as needed for anaphylaxis (for peanut allergy).    . fenofibrate (TRICOR) 145 MG tablet Take 1 tablet (145 mg total) by mouth daily. 90 tablet 3  . fenofibrate micronized (LOFIBRA) 67 MG capsule     . furosemide (LASIX) 40 MG tablet Take 40 mg by mouth daily.    Marland Kitchen gabapentin (NEURONTIN) 300 MG capsule Take 600 mg by mouth 3 (three) times daily.     Marland Kitchen gabapentin (NEURONTIN) 400 MG capsule Take 400 mg by mouth.    Marland Kitchen  HYDROcodone-acetaminophen (NORCO/VICODIN) 5-325 MG tablet Take 1 tablet by mouth daily as needed.    Marland Kitchen icosapent Ethyl (VASCEPA) 1 g capsule Take 2 g by mouth 2 (two) times daily.    . irbesartan (AVAPRO) 300 MG tablet Take 1 tablet (300 mg total) by mouth daily. 30 tablet 5  . levofloxacin (LEVAQUIN) 500 MG tablet Take 1 tablet (500 mg total) by mouth daily. 7 tablet 0  . Loratadine 10 MG CAPS Take 10 mg by mouth daily.     . metFORMIN (GLUCOPHAGE-XR) 750 MG 24 hr tablet Take 750 mg by mouth in the morning and at bedtime.     . metoprolol (LOPRESSOR) 50 MG tablet Take 50 mg by mouth 3 (three) times daily.    . Multiple Vitamin (MULTIVITAMIN WITH MINERALS) TABS Take 1 tablet by mouth daily.    .  nitroGLYCERIN (NITROSTAT) 0.4 MG SL tablet Place 1 tablet (0.4 mg total) under the tongue every 5 (five) minutes as needed for chest pain. 25 tablet 3  . Omega-3 1000 MG CAPS Take 1,000 mg by mouth daily.    . pantoprazole (PROTONIX) 40 MG tablet Take 40 mg by mouth daily.     . silver sulfADIAZINE (SILVADENE) 1 % cream Apply 1 application topically daily. 50 g 0  . tiZANidine (ZANAFLEX) 4 MG tablet Take 4 mg by mouth at bedtime.    . TraMADol HCl 150 MG CP24 Take 150 mg by mouth at bedtime.    . traZODone (DESYREL) 100 MG tablet Take 100 mg by mouth at bedtime.    . TRUE METRIX BLOOD GLUCOSE TEST test strip SMARTSIG:Via Meter    . TRUEplus Lancets 30G MISC     . venlafaxine XR (EFFEXOR-XR) 75 MG 24 hr capsule Take 75 mg by mouth daily.      No current facility-administered medications on file prior to visit.   Allergies  Allergen Reactions  . Other Anaphylaxis and Swelling    Nuts   . Peanut Oil Shortness Of Breath  . Penicillins Hives and Shortness Of Breath    No results found for this or any previous visit (from the past 2160 hour(s)).  Objective: There were no vitals filed for this visit.  General: Patient is awake, alert, oriented x 3 and in no acute distress.  Dermatology: Skin is warm and dry bilateral with a now scabbed over wound at the left posterior heel.  There is no malodor, no active drainage, resolved erythema, minimal focal edema.No acute signs of infection.  Minimal reactive callus  left 1st toe medial aspect with no signs of infection.  Left 1st toe history of nail removal with small specks in the nail bed with no signs of infection.    Vascular: Dorsalis Pedis pulse = 1/4 Bilateral,  Posterior Tibial pulse = 1/4 Bilateral,  Capillary Fill Time < 5 seconds  Neurologic: Protective severely diminished bilateral.  Musculosketal: There is mild pain with palpation to ulcerated area that is improving compared to last visit. No pain with compression to calves  bilateral. +Bunion and hammertoe deformity bilateral.  No results for input(s): GRAMSTAIN, LABORGA in the last 8760 hours.  Assessment and Plan:  Problem List Items Addressed This Visit      Endocrine   Diabetes mellitus ([place])     Nervous and Auditory   Neuropathy    Other Visit Diagnoses    Ulcer of left heel and midfoot, unspecified ulcer stage ([place])    -  Primary   healed   PVD (  peripheral vascular disease) ([place])          -Examined patient and discussed the progression of the wound and treatment alternatives. -Previous ulcer remains healed with minimal reactive keratosis so using a 15 blade mechanically debrided the left heel with no underlying opening noted and applied a protective Band-Aid to the area -Advised patient to continue with protective Band-Aid to the area -Advised patient to go to the ER or return to office if the wound worsens or if constitutional symptoms are present. -Patient to return to office in 4 weeks and at this visit if she is doing well may discontinue Band-Aid at next visit and advised patient to continue with Lasix to help with edema control.  Landis Martins, DPM

## 2021-02-16 DIAGNOSIS — E782 Mixed hyperlipidemia: Secondary | ICD-10-CM | POA: Diagnosis not present

## 2021-02-16 DIAGNOSIS — I10 Essential (primary) hypertension: Secondary | ICD-10-CM | POA: Diagnosis not present

## 2021-03-11 ENCOUNTER — Ambulatory Visit: Payer: Medicare HMO | Admitting: Cardiology

## 2021-03-17 ENCOUNTER — Ambulatory Visit: Payer: Medicare HMO | Admitting: Sports Medicine

## 2021-03-17 ENCOUNTER — Encounter: Payer: Self-pay | Admitting: Sports Medicine

## 2021-03-17 ENCOUNTER — Other Ambulatory Visit: Payer: Self-pay

## 2021-03-17 DIAGNOSIS — G629 Polyneuropathy, unspecified: Secondary | ICD-10-CM

## 2021-03-17 DIAGNOSIS — L97429 Non-pressure chronic ulcer of left heel and midfoot with unspecified severity: Secondary | ICD-10-CM

## 2021-03-17 DIAGNOSIS — I739 Peripheral vascular disease, unspecified: Secondary | ICD-10-CM | POA: Diagnosis not present

## 2021-03-17 DIAGNOSIS — E0800 Diabetes mellitus due to underlying condition with hyperosmolarity without nonketotic hyperglycemic-hyperosmolar coma (NKHHC): Secondary | ICD-10-CM

## 2021-03-17 NOTE — Progress Notes (Signed)
Subjective: Julie Leblanc is a 75 y.o. female patient seen in office for follow-up evaluation of left heel ulcer.  Patient reports that wound is doing good no opening no drainage states that she gets swelling in both feet and legs but no issues with the wound reopening currently using a protective Band-Aid to the area.  Denies nausea, vomiting, fevers, chills, or any other symptoms at this time.   FBS not recorded, A1c unknown.  Patient Active Problem List   Diagnosis Date Noted   Symptomatic severe aortic stenosis  11/12/2020   PAF (paroxysmal atrial fibrillation) ([place]) 11/12/2020   Dyspnea on exertion 07/24/2020   Chronic diastolic heart failure ([place]) 07/24/2020   Diabetes mellitus ([facility])    GERD (gastroesophageal reflux disease)    Hyperlipemia    Hypertension    Neuropathy    Coronary artery disease involving coronary bypass graft of native heart without angina pectoris 02/09/2018   Cellulitis of both lower extremities 06/17/2014   Obesity, Class I, BMI 30.0-34.9 (see actual BMI) 06/14/2014   Essential hypertension 05/10/2014   Chest pain 05/10/2014   Abnormal nuclear stress test 05/10/2014   Preoperative cardiovascular examination 04/05/2014   S/P CABG x 3 10/05/2013   DM2 (diabetes mellitus, type 2) ([place]) 10/05/2013   Dyslipidemia 10/05/2013   Aortic valve stenosis 10/05/2013   Dizzy 03/27/2013   CAD (coronary artery disease) 03/27/2013   Dark stools 03/27/2013   Knee joint pain 12/21/2012   Low back pain 12/21/2012   Aortic stenosis, mild 10/2012   Current Outpatient Medications on File Prior to Visit  Medication Sig Dispense Refill   acetaminophen (TYLENOL) 650 MG CR tablet Take 1,300 mg by mouth in the morning and at bedtime.     Alcohol Swabs (B-D SINGLE USE SWABS REGULAR) PADS Apply topically.     ALPRAZolam (XANAX) 1 MG tablet Take 1 mg by mouth at bedtime.     apixaban (ELIQUIS) 5 MG TABS tablet Take 5 mg by mouth 2 (two) times daily.     aspirin EC 81 MG tablet  Take 81 mg by mouth daily. Swallow whole.     atorvastatin (LIPITOR) 40 MG tablet Take 40 mg by mouth daily.      bisacodyl (DULCOLAX) 5 MG EC tablet Take 5 mg by mouth daily as needed for moderate constipation.     celecoxib (CELEBREX) 200 MG capsule      chlorthalidone (HYGROTON) 25 MG tablet Take 12.5 mg by mouth daily.     diltiazem (CARDIZEM CD) 120 MG 24 hr capsule      diltiazem (CARDIZEM SR) 120 MG 12 hr capsule Take 1 capsule (120 mg total) by mouth 2 (two) times daily. 60 capsule 11   diphenhydramine-acetaminophen (TYLENOL PM) 25-500 MG TABS Take 1 tablet by mouth at bedtime.     EPINEPHrine (EPI-PEN) 0.3 mg/0.3 mL SOAJ injection Inject 0.3 mg into the muscle once as needed for anaphylaxis (for peanut allergy).     fenofibrate (TRICOR) 145 MG tablet Take 1 tablet (145 mg total) by mouth daily. 90 tablet 3   fenofibrate micronized (LOFIBRA) 67 MG capsule      furosemide (LASIX) 40 MG tablet Take 40 mg by mouth daily.     gabapentin (NEURONTIN) 300 MG capsule Take 600 mg by mouth 3 (three) times daily.      gabapentin (NEURONTIN) 400 MG capsule Take 400 mg by mouth.     HYDROcodone-acetaminophen (NORCO/VICODIN) 5-325 MG tablet Take 1 tablet by mouth daily as needed.  icosapent Ethyl (VASCEPA) 1 g capsule Take 2 g by mouth 2 (two) times daily.     irbesartan (AVAPRO) 300 MG tablet Take 1 tablet (300 mg total) by mouth daily. 30 tablet 5   levofloxacin (LEVAQUIN) 500 MG tablet Take 1 tablet (500 mg total) by mouth daily. 7 tablet 0   Loratadine 10 MG CAPS Take 10 mg by mouth daily.      metFORMIN (GLUCOPHAGE-XR) 750 MG 24 hr tablet Take 750 mg by mouth in the morning and at bedtime.      metoprolol (LOPRESSOR) 50 MG tablet Take 50 mg by mouth 3 (three) times daily.     Multiple Vitamin (MULTIVITAMIN WITH MINERALS) TABS Take 1 tablet by mouth daily.     nitroGLYCERIN (NITROSTAT) 0.4 MG SL tablet Place 1 tablet (0.4 mg total) under the tongue every 5 (five) minutes as needed for chest  pain. 25 tablet 3   Omega-3 1000 MG CAPS Take 1,000 mg by mouth daily.     pantoprazole (PROTONIX) 40 MG tablet Take 40 mg by mouth daily.      silver sulfADIAZINE (SILVADENE) 1 % cream Apply 1 application topically daily. 50 g 0   tiZANidine (ZANAFLEX) 4 MG tablet Take 4 mg by mouth at bedtime.     TraMADol HCl 150 MG CP24 Take 150 mg by mouth at bedtime.     traZODone (DESYREL) 100 MG tablet Take 100 mg by mouth at bedtime.     TRUE METRIX BLOOD GLUCOSE TEST test strip SMARTSIG:Via Meter     TRUEplus Lancets 30G MISC      venlafaxine XR (EFFEXOR-XR) 75 MG 24 hr capsule Take 75 mg by mouth daily.      No current facility-administered medications on file prior to visit.   Allergies  Allergen Reactions   Other Anaphylaxis and Swelling    Nuts    Peanut Oil Shortness Of Breath   Penicillins Hives and Shortness Of Breath    No results found for this or any previous visit (from the past 2160 hour(s)).  Objective: There were no vitals filed for this visit.  General: Patient is awake, alert, oriented x 3 and in no acute distress.  Dermatology: Skin is warm and dry bilateral with a continued scabbed over wound at the left posterior heel.  There is no malodor, no active drainage, resolved erythema, minimal focal edema.No acute signs of infection.  Minimal reactive callus  left 1st toe medial aspect with no signs of infection.  Left 1st toe history of nail removal with small specks in the nail bed with no signs of infection.    Vascular: Dorsalis Pedis pulse = 1/4 Bilateral,  Posterior Tibial pulse = 1/4 Bilateral,  Capillary Fill Time < 5 seconds  Neurologic: Protective severely diminished bilateral.  Musculosketal: There is mild pain with palpation to ulcerated area that is improving compared to last visit. No pain with compression to calves bilateral. +Bunion and hammertoe deformity bilateral.  No results for input(s): GRAMSTAIN, LABORGA in the last 8760 hours.  Assessment and  Plan:  Problem List Items Addressed This Visit       Endocrine   Diabetes mellitus ([place])     Nervous and Auditory   Neuropathy   Other Visit Diagnoses     Ulcer of left heel and midfoot, unspecified ulcer stage ([place])    -  Primary   HEALED   PVD (peripheral vascular disease) ([place])           -Examined patient  and discussed the progression of the wound and treatment alternatives. -Previous ulcer remains healed with minimal reactive keratosis; may continue with protective Band-Aid to the area as I applied this visit -Advised Vaseline to the area once a week to help softening of the keratosis -Advised patient to go to the ER or return to office if the wound worsens or if constitutional symptoms are present. -At no additional charge mechanically debrided callus at left great toe using a 15 blade without incident -Patient to return to office in 8 weeks for wound check and advised patient to continue with elevation and Lasix to assist with edema control if edema worsens to discuss further with PCP  Landis Martins, DPM

## 2021-03-19 DIAGNOSIS — E782 Mixed hyperlipidemia: Secondary | ICD-10-CM | POA: Diagnosis not present

## 2021-03-19 DIAGNOSIS — I10 Essential (primary) hypertension: Secondary | ICD-10-CM | POA: Diagnosis not present

## 2021-03-31 ENCOUNTER — Telehealth: Payer: Self-pay

## 2021-03-31 DIAGNOSIS — E785 Hyperlipidemia, unspecified: Secondary | ICD-10-CM | POA: Diagnosis not present

## 2021-03-31 DIAGNOSIS — S8011XA Contusion of right lower leg, initial encounter: Secondary | ICD-10-CM | POA: Diagnosis not present

## 2021-03-31 DIAGNOSIS — E782 Mixed hyperlipidemia: Secondary | ICD-10-CM | POA: Diagnosis not present

## 2021-03-31 DIAGNOSIS — B029 Zoster without complications: Secondary | ICD-10-CM | POA: Diagnosis not present

## 2021-03-31 DIAGNOSIS — Z6835 Body mass index (BMI) 35.0-35.9, adult: Secondary | ICD-10-CM | POA: Diagnosis not present

## 2021-03-31 DIAGNOSIS — E1169 Type 2 diabetes mellitus with other specified complication: Secondary | ICD-10-CM | POA: Diagnosis not present

## 2021-03-31 NOTE — Telephone Encounter (Signed)
[facility] called and LVM stating they need a new Rx for silver cream faxed to [phone]

## 2021-04-10 DIAGNOSIS — B029 Zoster without complications: Secondary | ICD-10-CM | POA: Diagnosis not present

## 2021-04-19 DIAGNOSIS — I10 Essential (primary) hypertension: Secondary | ICD-10-CM | POA: Diagnosis not present

## 2021-04-19 DIAGNOSIS — E782 Mixed hyperlipidemia: Secondary | ICD-10-CM | POA: Diagnosis not present

## 2021-05-01 DIAGNOSIS — S81802A Unspecified open wound, left lower leg, initial encounter: Secondary | ICD-10-CM | POA: Diagnosis not present

## 2021-05-01 DIAGNOSIS — L03116 Cellulitis of left lower limb: Secondary | ICD-10-CM | POA: Diagnosis not present

## 2021-05-14 ENCOUNTER — Telehealth: Payer: Self-pay

## 2021-05-14 NOTE — Telephone Encounter (Signed)
  [facility]   MULTIDISCIPLINARY HEART VALVE TEAM  I contacted the pt to further discuss the need for evaluation of aortic stenosis.  The pt states that she currently has shingles and has been going to medical appointments frequently to get treatment and this has taken up a lot of her time. The pt also did not contact the dentist in her area to arrange an evaluation.    The pt did make me aware that she has noticed a worsening in her SOB and that she can no longer go grocery shopping without having to stop and rest frequently.  I offered to schedule the pt an appointment for an echocardiogram and office visit with Dr Burt Knack but the pt declined at this time.  I made her aware that she can also make an appointment at the [facility] location. I educated the pt that her symptoms will continue to progress due to her aortic stenosis and that she needs to continue evaluation with cardiology, but the pt declined. The pt states that she knows she needs to get her valve fixed but she is concerned about cost and recovery and she has other medical issues that are requiring attention.  At this time I asked the pt if I could contact her again in a month to discuss arranging follow-up and she agreed.

## 2021-05-15 DIAGNOSIS — L89309 Pressure ulcer of unspecified buttock, unspecified stage: Secondary | ICD-10-CM | POA: Diagnosis not present

## 2021-05-19 ENCOUNTER — Ambulatory Visit: Payer: Medicare HMO | Admitting: Sports Medicine

## 2021-05-19 ENCOUNTER — Other Ambulatory Visit: Payer: Self-pay

## 2021-05-19 ENCOUNTER — Encounter: Payer: Self-pay | Admitting: Sports Medicine

## 2021-05-19 DIAGNOSIS — L97429 Non-pressure chronic ulcer of left heel and midfoot with unspecified severity: Secondary | ICD-10-CM

## 2021-05-19 DIAGNOSIS — E0800 Diabetes mellitus due to underlying condition with hyperosmolarity without nonketotic hyperglycemic-hyperosmolar coma (NKHHC): Secondary | ICD-10-CM | POA: Diagnosis not present

## 2021-05-19 DIAGNOSIS — G629 Polyneuropathy, unspecified: Secondary | ICD-10-CM

## 2021-05-19 MED ORDER — LEVOFLOXACIN 500 MG PO TABS: 500.0000 mg | ORAL_TABLET | Freq: Every day | ORAL | 0 refills | Status: AC

## 2021-05-19 NOTE — Progress Notes (Signed)
Subjective: Julie Leblanc is a 75 y.o. female patient seen in office for follow-up evaluation of left heel ulcer.  Patient reports that wound is doing good no opening no drainage reports that she is doing well with a protective Band-Aid and occasional use of Vaseline to the area.  Fasting blood sugar 121 this morning A1c 7.2  Patient Active Problem List   Diagnosis Date Noted   Symptomatic severe aortic stenosis  11/12/2020   PAF (paroxysmal atrial fibrillation) ([place]) 11/12/2020   Dyspnea on exertion 07/24/2020   Chronic diastolic heart failure ([place]) 07/24/2020   Diabetes mellitus (HCC)    GERD (gastroesophageal reflux disease)    Hyperlipemia    Hypertension    Neuropathy    Coronary artery disease involving coronary bypass graft of native heart without angina pectoris 02/09/2018   Cellulitis of both lower extremities 06/17/2014   Obesity, Class I, BMI 30.0-34.9 (see actual BMI) 06/14/2014   Essential hypertension 05/10/2014   Chest pain 05/10/2014   Abnormal nuclear stress test 05/10/2014   Preoperative cardiovascular examination 04/05/2014   S/P CABG x 3 10/05/2013   DM2 (diabetes mellitus, type 2) ([place]) 10/05/2013   Dyslipidemia 10/05/2013   Aortic valve stenosis 10/05/2013   Dizzy 03/27/2013   CAD (coronary artery disease) 03/27/2013   Dark stools 03/27/2013   Knee joint pain 12/21/2012   Low back pain 12/21/2012   Aortic stenosis, mild 10/2012   Current Outpatient Medications on File Prior to Visit  Medication Sig Dispense Refill   acetaminophen (TYLENOL) 650 MG CR tablet Take 1,300 mg by mouth in the morning and at bedtime.     Alcohol Swabs (B-D SINGLE USE SWABS REGULAR) PADS Apply topically.     ALPRAZolam (XANAX) 1 MG tablet Take 1 mg by mouth at bedtime.     apixaban (ELIQUIS) 5 MG TABS tablet Take 5 mg by mouth 2 (two) times daily.     aspirin EC 81 MG tablet Take 81 mg by mouth daily. Swallow whole.     atorvastatin (LIPITOR) 40 MG tablet Take 40 mg by mouth  daily.      bisacodyl (DULCOLAX) 5 MG EC tablet Take 5 mg by mouth daily as needed for moderate constipation.     celecoxib (CELEBREX) 200 MG capsule      chlorthalidone (HYGROTON) 25 MG tablet Take 12.5 mg by mouth daily.     diltiazem (CARDIZEM CD) 120 MG 24 hr capsule      diltiazem (CARDIZEM SR) 120 MG 12 hr capsule Take 1 capsule (120 mg total) by mouth 2 (two) times daily. 60 capsule 11   diphenhydramine-acetaminophen (TYLENOL PM) 25-500 MG TABS Take 1 tablet by mouth at bedtime.     EPINEPHrine (EPI-PEN) 0.3 mg/0.3 mL SOAJ injection Inject 0.3 mg into the muscle once as needed for anaphylaxis (for peanut allergy).     fenofibrate (TRICOR) 145 MG tablet Take 1 tablet (145 mg total) by mouth daily. 90 tablet 3   fenofibrate micronized (LOFIBRA) 67 MG capsule      furosemide (LASIX) 40 MG tablet Take 40 mg by mouth daily.     gabapentin (NEURONTIN) 300 MG capsule Take 600 mg by mouth 3 (three) times daily.      gabapentin (NEURONTIN) 400 MG capsule Take 400 mg by mouth.     HYDROcodone-acetaminophen (NORCO/VICODIN) 5-325 MG tablet Take 1 tablet by mouth daily as needed.     icosapent Ethyl (VASCEPA) 1 g capsule Take 2 g by mouth 2 (two) times daily.  irbesartan (AVAPRO) 300 MG tablet Take 1 tablet (300 mg total) by mouth daily. 30 tablet 5   Loratadine 10 MG CAPS Take 10 mg by mouth daily.      metFORMIN (GLUCOPHAGE-XR) 750 MG 24 hr tablet Take 750 mg by mouth in the morning and at bedtime.      metoprolol (LOPRESSOR) 50 MG tablet Take 50 mg by mouth 3 (three) times daily.     Multiple Vitamin (MULTIVITAMIN WITH MINERALS) TABS Take 1 tablet by mouth daily.     nitroGLYCERIN (NITROSTAT) 0.4 MG SL tablet Place 1 tablet (0.4 mg total) under the tongue every 5 (five) minutes as needed for chest pain. 25 tablet 3   Omega-3 1000 MG CAPS Take 1,000 mg by mouth daily.     pantoprazole (PROTONIX) 40 MG tablet Take 40 mg by mouth daily.      silver sulfADIAZINE (SILVADENE) 1 % cream Apply 1  application topically daily. 50 g 0   tiZANidine (ZANAFLEX) 4 MG tablet Take 4 mg by mouth at bedtime.     TraMADol HCl 150 MG CP24 Take 150 mg by mouth at bedtime.     traZODone (DESYREL) 100 MG tablet Take 100 mg by mouth at bedtime.     TRUE METRIX BLOOD GLUCOSE TEST test strip SMARTSIG:Via Meter     TRUEplus Lancets 30G MISC      venlafaxine XR (EFFEXOR-XR) 75 MG 24 hr capsule Take 75 mg by mouth daily.      No current facility-administered medications on file prior to visit.   Allergies  Allergen Reactions   Other Anaphylaxis and Swelling    Nuts    Peanut Oil Shortness Of Breath   Penicillins Hives and Shortness Of Breath    No results found for this or any previous visit (from the past 2160 hour(s)).  Objective: There were no vitals filed for this visit.  General: Patient is awake, alert, oriented x 3 and in no acute distress.  Dermatology: Skin is warm and dry bilateral with a continued scabbed over wound at the left posterior heel.  Appears to be well-healed.  There is no malodor, no active drainage, resolved erythema, minimal focal edema.No acute signs of infection.  Minimal reactive callus  left 1st toe medial aspect with no signs of infection.  Left 1st toe history of nail removal with small specks in the nail bed with no signs of infection.    Vascular: Dorsalis Pedis pulse = 1/4 Bilateral,  Posterior Tibial pulse = 1/4 Bilateral,  Capillary Fill Time < 5 seconds  Neurologic: Protective severely diminished bilateral.  Musculosketal: There is mild pain with palpation to ulcerated area that is improving compared to last visit. No pain with compression to calves bilateral. +Bunion and hammertoe deformity bilateral.  No results for input(s): GRAMSTAIN, LABORGA in the last 8760 hours.  Assessment and Plan:  Problem List Items Addressed This Visit       Endocrine   Diabetes mellitus ([place])     Nervous and Auditory   Neuropathy   Other Visit Diagnoses      Ulcer of left heel and midfoot, unspecified ulcer stage ([place])    -  Primary       -Examined patient and discussed the progression of the wound and treatment alternatives. -Previous ulcer remains healed with minimal reactive keratosis; may continue with protective Band-Aid to the area as I applied this visit -Advised Vaseline to the area once a week to help softening of the keratosis like before -  Advised patient to go to the ER or return to office if the wound worsens or if constitutional symptoms are present. -Patient to return to office in 12-16 weeks for final wound check and advised patient to pain regarding sacral wound follow-up with out of precaution I did prescribe her Levaquin to take for this until she can see her PCP.  Landis Martins, DPM

## 2021-05-20 DIAGNOSIS — E782 Mixed hyperlipidemia: Secondary | ICD-10-CM | POA: Diagnosis not present

## 2021-05-20 DIAGNOSIS — I10 Essential (primary) hypertension: Secondary | ICD-10-CM | POA: Diagnosis not present

## 2021-06-03 DIAGNOSIS — L299 Pruritus, unspecified: Secondary | ICD-10-CM | POA: Diagnosis not present

## 2021-06-03 DIAGNOSIS — L308 Other specified dermatitis: Secondary | ICD-10-CM | POA: Diagnosis not present

## 2021-06-03 DIAGNOSIS — L89302 Pressure ulcer of unspecified buttock, stage 2: Secondary | ICD-10-CM | POA: Diagnosis not present

## 2021-06-03 DIAGNOSIS — Z6835 Body mass index (BMI) 35.0-35.9, adult: Secondary | ICD-10-CM | POA: Diagnosis not present

## 2021-06-03 DIAGNOSIS — M159 Polyosteoarthritis, unspecified: Secondary | ICD-10-CM | POA: Diagnosis not present

## 2021-06-14 DIAGNOSIS — I2510 Atherosclerotic heart disease of native coronary artery without angina pectoris: Secondary | ICD-10-CM | POA: Diagnosis not present

## 2021-06-14 DIAGNOSIS — L89322 Pressure ulcer of left buttock, stage 2: Secondary | ICD-10-CM | POA: Diagnosis not present

## 2021-06-14 DIAGNOSIS — K219 Gastro-esophageal reflux disease without esophagitis: Secondary | ICD-10-CM | POA: Diagnosis not present

## 2021-06-14 DIAGNOSIS — M159 Polyosteoarthritis, unspecified: Secondary | ICD-10-CM | POA: Diagnosis not present

## 2021-06-14 DIAGNOSIS — E782 Mixed hyperlipidemia: Secondary | ICD-10-CM | POA: Diagnosis not present

## 2021-06-14 DIAGNOSIS — E119 Type 2 diabetes mellitus without complications: Secondary | ICD-10-CM | POA: Diagnosis not present

## 2021-06-14 DIAGNOSIS — I10 Essential (primary) hypertension: Secondary | ICD-10-CM | POA: Diagnosis not present

## 2021-06-14 DIAGNOSIS — I350 Nonrheumatic aortic (valve) stenosis: Secondary | ICD-10-CM | POA: Diagnosis not present

## 2021-06-14 DIAGNOSIS — F339 Major depressive disorder, recurrent, unspecified: Secondary | ICD-10-CM | POA: Diagnosis not present

## 2021-06-19 DIAGNOSIS — I10 Essential (primary) hypertension: Secondary | ICD-10-CM | POA: Diagnosis not present

## 2021-06-19 DIAGNOSIS — K219 Gastro-esophageal reflux disease without esophagitis: Secondary | ICD-10-CM | POA: Diagnosis not present

## 2021-06-19 DIAGNOSIS — E1169 Type 2 diabetes mellitus with other specified complication: Secondary | ICD-10-CM | POA: Diagnosis not present

## 2021-07-14 DIAGNOSIS — I350 Nonrheumatic aortic (valve) stenosis: Secondary | ICD-10-CM | POA: Diagnosis not present

## 2021-07-14 DIAGNOSIS — F339 Major depressive disorder, recurrent, unspecified: Secondary | ICD-10-CM | POA: Diagnosis not present

## 2021-07-14 DIAGNOSIS — K219 Gastro-esophageal reflux disease without esophagitis: Secondary | ICD-10-CM | POA: Diagnosis not present

## 2021-07-14 DIAGNOSIS — M159 Polyosteoarthritis, unspecified: Secondary | ICD-10-CM | POA: Diagnosis not present

## 2021-07-14 DIAGNOSIS — I2510 Atherosclerotic heart disease of native coronary artery without angina pectoris: Secondary | ICD-10-CM | POA: Diagnosis not present

## 2021-07-14 DIAGNOSIS — I10 Essential (primary) hypertension: Secondary | ICD-10-CM | POA: Diagnosis not present

## 2021-07-14 DIAGNOSIS — E119 Type 2 diabetes mellitus without complications: Secondary | ICD-10-CM | POA: Diagnosis not present

## 2021-07-14 DIAGNOSIS — E782 Mixed hyperlipidemia: Secondary | ICD-10-CM | POA: Diagnosis not present

## 2021-07-14 DIAGNOSIS — L89322 Pressure ulcer of left buttock, stage 2: Secondary | ICD-10-CM | POA: Diagnosis not present

## 2021-07-15 DIAGNOSIS — M159 Polyosteoarthritis, unspecified: Secondary | ICD-10-CM | POA: Diagnosis not present

## 2021-07-15 DIAGNOSIS — L89322 Pressure ulcer of left buttock, stage 2: Secondary | ICD-10-CM | POA: Diagnosis not present

## 2021-07-15 DIAGNOSIS — F339 Major depressive disorder, recurrent, unspecified: Secondary | ICD-10-CM | POA: Diagnosis not present

## 2021-07-15 DIAGNOSIS — K219 Gastro-esophageal reflux disease without esophagitis: Secondary | ICD-10-CM | POA: Diagnosis not present

## 2021-07-15 DIAGNOSIS — I2510 Atherosclerotic heart disease of native coronary artery without angina pectoris: Secondary | ICD-10-CM | POA: Diagnosis not present

## 2021-07-15 DIAGNOSIS — I10 Essential (primary) hypertension: Secondary | ICD-10-CM | POA: Diagnosis not present

## 2021-07-15 DIAGNOSIS — E782 Mixed hyperlipidemia: Secondary | ICD-10-CM | POA: Diagnosis not present

## 2021-07-15 DIAGNOSIS — E119 Type 2 diabetes mellitus without complications: Secondary | ICD-10-CM | POA: Diagnosis not present

## 2021-07-15 DIAGNOSIS — I350 Nonrheumatic aortic (valve) stenosis: Secondary | ICD-10-CM | POA: Diagnosis not present

## 2021-07-20 DIAGNOSIS — E1169 Type 2 diabetes mellitus with other specified complication: Secondary | ICD-10-CM | POA: Diagnosis not present

## 2021-07-20 DIAGNOSIS — I10 Essential (primary) hypertension: Secondary | ICD-10-CM | POA: Diagnosis not present

## 2021-07-20 DIAGNOSIS — K219 Gastro-esophageal reflux disease without esophagitis: Secondary | ICD-10-CM | POA: Diagnosis not present

## 2021-09-22 ENCOUNTER — Other Ambulatory Visit: Payer: Self-pay

## 2021-09-22 ENCOUNTER — Ambulatory Visit: Payer: Medicare HMO | Admitting: Sports Medicine

## 2021-09-22 DIAGNOSIS — E0800 Diabetes mellitus due to underlying condition with hyperosmolarity without nonketotic hyperglycemic-hyperosmolar coma (NKHHC): Secondary | ICD-10-CM | POA: Diagnosis not present

## 2021-09-22 DIAGNOSIS — B351 Tinea unguium: Secondary | ICD-10-CM | POA: Diagnosis not present

## 2021-09-22 DIAGNOSIS — M79674 Pain in right toe(s): Secondary | ICD-10-CM | POA: Diagnosis not present

## 2021-09-22 DIAGNOSIS — M79675 Pain in left toe(s): Secondary | ICD-10-CM | POA: Diagnosis not present

## 2021-09-22 DIAGNOSIS — G629 Polyneuropathy, unspecified: Secondary | ICD-10-CM | POA: Diagnosis not present

## 2021-09-22 DIAGNOSIS — L97429 Non-pressure chronic ulcer of left heel and midfoot with unspecified severity: Secondary | ICD-10-CM

## 2021-09-22 NOTE — Progress Notes (Signed)
Subjective: Julie Leblanc is a 76 y.o. female patient seen in office for follow-up evaluation of left heel ulcer.  Patient reports that wound is healed and no longer using anything to the area.  Patient also request her nails to be trimmed this visit.  Fasting blood sugar not recorded A1c 7.2 Last PCP visit September  Patient Active Problem List   Diagnosis Date Noted   Symptomatic severe aortic stenosis  11/12/2020   PAF (paroxysmal atrial fibrillation) ([place]) 11/12/2020   Dyspnea on exertion 07/24/2020   Chronic diastolic heart failure ([place]) 07/24/2020   Diabetes mellitus (HCC)    GERD (gastroesophageal reflux disease)    Hyperlipemia    Hypertension    Neuropathy    Coronary artery disease involving coronary bypass graft of native heart without angina pectoris 02/09/2018   Cellulitis of both lower extremities 06/17/2014   Obesity, Class I, BMI 30.0-34.9 (see actual BMI) 06/14/2014   Essential hypertension 05/10/2014   Chest pain 05/10/2014   Abnormal nuclear stress test 05/10/2014   Preoperative cardiovascular examination 04/05/2014   S/P CABG x 3 10/05/2013   DM2 (diabetes mellitus, type 2) ([place]) 10/05/2013   Dyslipidemia 10/05/2013   Aortic valve stenosis 10/05/2013   Dizzy 03/27/2013   CAD (coronary artery disease) 03/27/2013   Dark stools 03/27/2013   Knee joint pain 12/21/2012   Low back pain 12/21/2012   Aortic stenosis, mild 10/2012   Current Outpatient Medications on File Prior to Visit  Medication Sig Dispense Refill   acetaminophen (TYLENOL) 650 MG CR tablet Take 1,300 mg by mouth in the morning and at bedtime.     Alcohol Swabs (B-D SINGLE USE SWABS REGULAR) PADS Apply topically.     ALPRAZolam (XANAX) 1 MG tablet Take 1 mg by mouth at bedtime.     apixaban (ELIQUIS) 5 MG TABS tablet Take 5 mg by mouth 2 (two) times daily.     aspirin EC 81 MG tablet Take 81 mg by mouth daily. Swallow whole.     atorvastatin (LIPITOR) 40 MG tablet Take 40 mg by mouth daily.       bisacodyl (DULCOLAX) 5 MG EC tablet Take 5 mg by mouth daily as needed for moderate constipation.     celecoxib (CELEBREX) 200 MG capsule      chlorthalidone (HYGROTON) 25 MG tablet Take 12.5 mg by mouth daily.     diltiazem (CARDIZEM CD) 120 MG 24 hr capsule      diltiazem (CARDIZEM SR) 120 MG 12 hr capsule Take 1 capsule (120 mg total) by mouth 2 (two) times daily. 60 capsule 11   diphenhydramine-acetaminophen (TYLENOL PM) 25-500 MG TABS Take 1 tablet by mouth at bedtime.     EPINEPHrine (EPI-PEN) 0.3 mg/0.3 mL SOAJ injection Inject 0.3 mg into the muscle once as needed for anaphylaxis (for peanut allergy).     fenofibrate (TRICOR) 145 MG tablet Take 1 tablet (145 mg total) by mouth daily. 90 tablet 3   fenofibrate micronized (LOFIBRA) 67 MG capsule      furosemide (LASIX) 40 MG tablet Take 40 mg by mouth daily.     gabapentin (NEURONTIN) 300 MG capsule Take 600 mg by mouth 3 (three) times daily.      gabapentin (NEURONTIN) 400 MG capsule Take 400 mg by mouth.     HYDROcodone-acetaminophen (NORCO/VICODIN) 5-325 MG tablet Take 1 tablet by mouth daily as needed.     icosapent Ethyl (VASCEPA) 1 g capsule Take 2 g by mouth 2 (two) times daily.  irbesartan (AVAPRO) 300 MG tablet Take 1 tablet (300 mg total) by mouth daily. 30 tablet 5   levofloxacin (LEVAQUIN) 500 MG tablet Take 1 tablet (500 mg total) by mouth daily. 7 tablet 0   Loratadine 10 MG CAPS Take 10 mg by mouth daily.      metFORMIN (GLUCOPHAGE-XR) 750 MG 24 hr tablet Take 750 mg by mouth in the morning and at bedtime.      metoprolol (LOPRESSOR) 50 MG tablet Take 50 mg by mouth 3 (three) times daily.     Multiple Vitamin (MULTIVITAMIN WITH MINERALS) TABS Take 1 tablet by mouth daily.     nitroGLYCERIN (NITROSTAT) 0.4 MG SL tablet Place 1 tablet (0.4 mg total) under the tongue every 5 (five) minutes as needed for chest pain. 25 tablet 3   Omega-3 1000 MG CAPS Take 1,000 mg by mouth daily.     pantoprazole (PROTONIX) 40 MG  tablet Take 40 mg by mouth daily.      silver sulfADIAZINE (SILVADENE) 1 % cream Apply 1 application topically daily. 50 g 0   tiZANidine (ZANAFLEX) 4 MG tablet Take 4 mg by mouth at bedtime.     TraMADol HCl 150 MG CP24 Take 150 mg by mouth at bedtime.     traZODone (DESYREL) 100 MG tablet Take 100 mg by mouth at bedtime.     TRUE METRIX BLOOD GLUCOSE TEST test strip SMARTSIG:Via Meter     TRUEplus Lancets 30G MISC      venlafaxine XR (EFFEXOR-XR) 75 MG 24 hr capsule Take 75 mg by mouth daily.      No current facility-administered medications on file prior to visit.   Allergies  Allergen Reactions   Other Anaphylaxis and Swelling    Nuts    Peanut Oil Shortness Of Breath   Penicillins Hives and Shortness Of Breath    No results found for this or any previous visit (from the past 2160 hour(s)).  Objective: There were no vitals filed for this visit.  General: Patient is awake, alert, oriented x 3 and in no acute distress.  Dermatology: Skin is warm and dry bilateral with a continued healed wound at the left posterior heel no acute signs of infection.  Doing well no longer needs bandages.  Minimal reactive callus  left 1st toe medial aspect with no signs of infection.  Left 1st toe history of nail removal with small specks in the nail bed with no signs of infection.  All other nails elongated and thickened consistent with onychomycosis.   Vascular: Dorsalis Pedis pulse = 1/4 Bilateral,  Posterior Tibial pulse = 1/4 Bilateral,  Capillary Fill Time < 5 seconds  Neurologic: Protective severely diminished bilateral.  Musculosketal:  +Bunion and hammertoe deformity bilateral with significant varus deformity on the right.  No results for input(s): GRAMSTAIN, LABORGA in the last 8760 hours.  Assessment and Plan:  Problem List Items Addressed This Visit       Endocrine   Diabetes mellitus ([place])     Nervous and Auditory   Neuropathy   Other Visit Diagnoses     Ulcer of left  heel and midfoot, unspecified ulcer stage ([place])    -  Primary   Healed   Pain due to onychomycosis of toenails of both feet           -Examined patient  -Previous left heel ulcer remains well-healed continue to monitor does not need dressings -Advised patient to go to the ER or return to office if the  wound worsens or if constitutional symptoms are present. -Mechanically debrided nails x9 using a sterile nail nipper without incident and rotary bur grinding nails in length and girth -Using a 15 blade at no additional charge mechanically debrided and pared down callus at medial left hallux -Patient to return to office in 3 months for routine nail and callus care or sooner if problems or issues arise.  Landis Martins, DPM

## 2021-11-23 DIAGNOSIS — M159 Polyosteoarthritis, unspecified: Secondary | ICD-10-CM | POA: Diagnosis not present

## 2021-11-23 DIAGNOSIS — Z79899 Other long term (current) drug therapy: Secondary | ICD-10-CM | POA: Diagnosis not present

## 2021-11-23 DIAGNOSIS — Z6835 Body mass index (BMI) 35.0-35.9, adult: Secondary | ICD-10-CM | POA: Diagnosis not present

## 2021-11-23 DIAGNOSIS — E1169 Type 2 diabetes mellitus with other specified complication: Secondary | ICD-10-CM | POA: Diagnosis not present

## 2021-11-23 DIAGNOSIS — Z0000 Encounter for general adult medical examination without abnormal findings: Secondary | ICD-10-CM | POA: Diagnosis not present

## 2021-11-23 DIAGNOSIS — L299 Pruritus, unspecified: Secondary | ICD-10-CM | POA: Diagnosis not present

## 2021-11-23 DIAGNOSIS — Z131 Encounter for screening for diabetes mellitus: Secondary | ICD-10-CM | POA: Diagnosis not present

## 2021-11-23 DIAGNOSIS — E785 Hyperlipidemia, unspecified: Secondary | ICD-10-CM | POA: Diagnosis not present

## 2021-12-29 ENCOUNTER — Ambulatory Visit: Payer: Medicare HMO | Admitting: Sports Medicine

## 2022-03-30 ENCOUNTER — Ambulatory Visit: Payer: Medicare HMO | Admitting: Sports Medicine

## 2022-04-16 DIAGNOSIS — L981 Factitial dermatitis: Secondary | ICD-10-CM | POA: Diagnosis not present

## 2022-07-02 DIAGNOSIS — J029 Acute pharyngitis, unspecified: Secondary | ICD-10-CM | POA: Diagnosis not present

## 2022-07-02 DIAGNOSIS — R051 Acute cough: Secondary | ICD-10-CM | POA: Diagnosis not present

## 2022-07-02 DIAGNOSIS — R21 Rash and other nonspecific skin eruption: Secondary | ICD-10-CM | POA: Diagnosis not present

## 2022-07-02 DIAGNOSIS — F424 Excoriation (skin-picking) disorder: Secondary | ICD-10-CM | POA: Diagnosis not present

## 2022-07-06 ENCOUNTER — Ambulatory Visit: Payer: Medicare HMO | Admitting: Sports Medicine

## 2022-07-09 DIAGNOSIS — I2510 Atherosclerotic heart disease of native coronary artery without angina pectoris: Secondary | ICD-10-CM | POA: Diagnosis not present

## 2022-07-09 DIAGNOSIS — R110 Nausea: Secondary | ICD-10-CM | POA: Diagnosis not present

## 2022-07-09 DIAGNOSIS — I700 Atherosclerosis of aorta: Secondary | ICD-10-CM | POA: Diagnosis not present

## 2022-07-09 DIAGNOSIS — R1084 Generalized abdominal pain: Secondary | ICD-10-CM | POA: Diagnosis not present

## 2022-07-09 DIAGNOSIS — K529 Noninfective gastroenteritis and colitis, unspecified: Secondary | ICD-10-CM | POA: Diagnosis not present

## 2022-07-09 DIAGNOSIS — R197 Diarrhea, unspecified: Secondary | ICD-10-CM | POA: Diagnosis not present

## 2022-07-09 DIAGNOSIS — R109 Unspecified abdominal pain: Secondary | ICD-10-CM | POA: Diagnosis not present

## 2022-07-12 DIAGNOSIS — M545 Low back pain, unspecified: Secondary | ICD-10-CM | POA: Diagnosis present

## 2022-07-12 DIAGNOSIS — M2578 Osteophyte, vertebrae: Secondary | ICD-10-CM | POA: Diagnosis not present

## 2022-07-12 DIAGNOSIS — I2510 Atherosclerotic heart disease of native coronary artery without angina pectoris: Secondary | ICD-10-CM | POA: Diagnosis not present

## 2022-07-12 DIAGNOSIS — E871 Hypo-osmolality and hyponatremia: Secondary | ICD-10-CM | POA: Diagnosis not present

## 2022-07-12 DIAGNOSIS — R1111 Vomiting without nausea: Secondary | ICD-10-CM | POA: Diagnosis not present

## 2022-07-12 DIAGNOSIS — Z043 Encounter for examination and observation following other accident: Secondary | ICD-10-CM | POA: Diagnosis not present

## 2022-07-12 DIAGNOSIS — M48061 Spinal stenosis, lumbar region without neurogenic claudication: Secondary | ICD-10-CM | POA: Diagnosis not present

## 2022-07-12 DIAGNOSIS — R110 Nausea: Secondary | ICD-10-CM | POA: Diagnosis not present

## 2022-07-12 DIAGNOSIS — L03115 Cellulitis of right lower limb: Secondary | ICD-10-CM | POA: Diagnosis not present

## 2022-07-12 DIAGNOSIS — I499 Cardiac arrhythmia, unspecified: Secondary | ICD-10-CM | POA: Diagnosis not present

## 2022-07-12 DIAGNOSIS — G4489 Other headache syndrome: Secondary | ICD-10-CM | POA: Diagnosis not present

## 2022-07-12 DIAGNOSIS — M4312 Spondylolisthesis, cervical region: Secondary | ICD-10-CM | POA: Diagnosis not present

## 2022-07-12 DIAGNOSIS — M25551 Pain in right hip: Secondary | ICD-10-CM | POA: Diagnosis not present

## 2022-07-12 DIAGNOSIS — Z7901 Long term (current) use of anticoagulants: Secondary | ICD-10-CM | POA: Diagnosis not present

## 2022-07-12 DIAGNOSIS — S3992XA Unspecified injury of lower back, initial encounter: Secondary | ICD-10-CM | POA: Diagnosis not present

## 2022-07-12 DIAGNOSIS — S0990XA Unspecified injury of head, initial encounter: Secondary | ICD-10-CM | POA: Diagnosis not present

## 2022-07-12 DIAGNOSIS — I4891 Unspecified atrial fibrillation: Secondary | ICD-10-CM | POA: Diagnosis not present

## 2022-07-12 DIAGNOSIS — M47812 Spondylosis without myelopathy or radiculopathy, cervical region: Secondary | ICD-10-CM | POA: Diagnosis not present

## 2022-07-12 DIAGNOSIS — I700 Atherosclerosis of aorta: Secondary | ICD-10-CM | POA: Diagnosis not present

## 2022-07-12 DIAGNOSIS — Y9301 Activity, walking, marching and hiking: Secondary | ICD-10-CM | POA: Diagnosis not present

## 2022-07-12 DIAGNOSIS — R0902 Hypoxemia: Secondary | ICD-10-CM | POA: Diagnosis not present

## 2022-07-12 DIAGNOSIS — K8070 Calculus of gallbladder and bile duct without cholecystitis without obstruction: Secondary | ICD-10-CM | POA: Diagnosis not present

## 2022-07-12 DIAGNOSIS — N179 Acute kidney failure, unspecified: Secondary | ICD-10-CM | POA: Diagnosis not present

## 2022-07-12 DIAGNOSIS — M549 Dorsalgia, unspecified: Secondary | ICD-10-CM | POA: Diagnosis not present

## 2022-07-12 DIAGNOSIS — M5459 Other low back pain: Secondary | ICD-10-CM | POA: Diagnosis not present

## 2022-07-12 DIAGNOSIS — R519 Headache, unspecified: Secondary | ICD-10-CM | POA: Diagnosis not present

## 2022-07-12 DIAGNOSIS — R1084 Generalized abdominal pain: Secondary | ICD-10-CM | POA: Diagnosis not present

## 2022-07-12 DIAGNOSIS — R7401 Elevation of levels of liver transaminase levels: Secondary | ICD-10-CM | POA: Diagnosis not present

## 2022-07-12 DIAGNOSIS — R531 Weakness: Secondary | ICD-10-CM | POA: Diagnosis not present

## 2022-07-12 DIAGNOSIS — M5030 Other cervical disc degeneration, unspecified cervical region: Secondary | ICD-10-CM | POA: Diagnosis not present

## 2022-07-12 DIAGNOSIS — I739 Peripheral vascular disease, unspecified: Secondary | ICD-10-CM | POA: Diagnosis not present

## 2022-07-12 DIAGNOSIS — K760 Fatty (change of) liver, not elsewhere classified: Secondary | ICD-10-CM | POA: Diagnosis not present

## 2022-07-12 DIAGNOSIS — E876 Hypokalemia: Secondary | ICD-10-CM | POA: Diagnosis not present

## 2022-07-12 DIAGNOSIS — I10 Essential (primary) hypertension: Secondary | ICD-10-CM | POA: Diagnosis not present

## 2022-07-12 DIAGNOSIS — Z91010 Allergy to peanuts: Secondary | ICD-10-CM | POA: Diagnosis not present

## 2022-07-12 DIAGNOSIS — E119 Type 2 diabetes mellitus without complications: Secondary | ICD-10-CM | POA: Diagnosis not present

## 2022-07-12 DIAGNOSIS — M47816 Spondylosis without myelopathy or radiculopathy, lumbar region: Secondary | ICD-10-CM | POA: Diagnosis not present

## 2022-07-12 DIAGNOSIS — W1781XA Fall down embankment (hill), initial encounter: Secondary | ICD-10-CM | POA: Diagnosis not present

## 2022-07-12 DIAGNOSIS — Z951 Presence of aortocoronary bypass graft: Secondary | ICD-10-CM | POA: Diagnosis not present

## 2022-07-12 DIAGNOSIS — I350 Nonrheumatic aortic (valve) stenosis: Secondary | ICD-10-CM | POA: Diagnosis not present

## 2022-07-12 DIAGNOSIS — I6523 Occlusion and stenosis of bilateral carotid arteries: Secondary | ICD-10-CM | POA: Diagnosis not present

## 2022-07-12 DIAGNOSIS — M546 Pain in thoracic spine: Secondary | ICD-10-CM | POA: Diagnosis not present

## 2022-07-12 DIAGNOSIS — W19XXXA Unspecified fall, initial encounter: Secondary | ICD-10-CM | POA: Diagnosis not present

## 2022-07-12 DIAGNOSIS — Z20822 Contact with and (suspected) exposure to covid-19: Secondary | ICD-10-CM | POA: Diagnosis not present

## 2022-07-12 DIAGNOSIS — Z7982 Long term (current) use of aspirin: Secondary | ICD-10-CM | POA: Diagnosis not present

## 2022-07-12 DIAGNOSIS — Z9889 Other specified postprocedural states: Secondary | ICD-10-CM | POA: Diagnosis not present

## 2022-07-12 DIAGNOSIS — Z79899 Other long term (current) drug therapy: Secondary | ICD-10-CM | POA: Diagnosis not present

## 2022-07-12 DIAGNOSIS — M4802 Spinal stenosis, cervical region: Secondary | ICD-10-CM | POA: Diagnosis not present

## 2022-07-12 DIAGNOSIS — K8050 Calculus of bile duct without cholangitis or cholecystitis without obstruction: Secondary | ICD-10-CM | POA: Diagnosis not present

## 2022-07-12 DIAGNOSIS — Z7401 Bed confinement status: Secondary | ICD-10-CM | POA: Diagnosis not present

## 2022-07-16 ENCOUNTER — Emergency Department (HOSPITAL_COMMUNITY)
Admission: EM | Admit: 2022-07-16 | Discharge: 2022-07-17 | Disposition: A | Discharge status: Home / Self Care | Payer: Medicare HMO | Attending: Emergency Medicine | Admitting: Emergency Medicine

## 2022-07-16 ENCOUNTER — Emergency Department (HOSPITAL_COMMUNITY): Payer: Medicare HMO

## 2022-07-16 ENCOUNTER — Encounter (HOSPITAL_COMMUNITY): Payer: Self-pay | Admitting: Emergency Medicine

## 2022-07-16 DIAGNOSIS — Y9301 Activity, walking, marching and hiking: Secondary | ICD-10-CM | POA: Insufficient documentation

## 2022-07-16 DIAGNOSIS — M545 Low back pain, unspecified: Secondary | ICD-10-CM | POA: Diagnosis not present

## 2022-07-16 DIAGNOSIS — W1781XA Fall down embankment (hill), initial encounter: Secondary | ICD-10-CM | POA: Insufficient documentation

## 2022-07-16 DIAGNOSIS — M2578 Osteophyte, vertebrae: Secondary | ICD-10-CM | POA: Diagnosis not present

## 2022-07-16 DIAGNOSIS — M47816 Spondylosis without myelopathy or radiculopathy, lumbar region: Secondary | ICD-10-CM | POA: Diagnosis not present

## 2022-07-16 DIAGNOSIS — Z91010 Allergy to peanuts: Secondary | ICD-10-CM | POA: Diagnosis not present

## 2022-07-16 DIAGNOSIS — M25551 Pain in right hip: Secondary | ICD-10-CM | POA: Diagnosis not present

## 2022-07-16 DIAGNOSIS — Z7901 Long term (current) use of anticoagulants: Secondary | ICD-10-CM | POA: Diagnosis not present

## 2022-07-16 DIAGNOSIS — Z79899 Other long term (current) drug therapy: Secondary | ICD-10-CM | POA: Insufficient documentation

## 2022-07-16 DIAGNOSIS — S0990XA Unspecified injury of head, initial encounter: Secondary | ICD-10-CM | POA: Insufficient documentation

## 2022-07-16 DIAGNOSIS — M47812 Spondylosis without myelopathy or radiculopathy, cervical region: Secondary | ICD-10-CM | POA: Diagnosis not present

## 2022-07-16 DIAGNOSIS — M546 Pain in thoracic spine: Secondary | ICD-10-CM | POA: Diagnosis not present

## 2022-07-16 DIAGNOSIS — S3992XA Unspecified injury of lower back, initial encounter: Secondary | ICD-10-CM | POA: Diagnosis not present

## 2022-07-16 DIAGNOSIS — Z043 Encounter for examination and observation following other accident: Secondary | ICD-10-CM | POA: Diagnosis not present

## 2022-07-16 DIAGNOSIS — Z7982 Long term (current) use of aspirin: Secondary | ICD-10-CM | POA: Diagnosis not present

## 2022-07-16 DIAGNOSIS — M5459 Other low back pain: Secondary | ICD-10-CM | POA: Diagnosis not present

## 2022-07-16 DIAGNOSIS — I739 Peripheral vascular disease, unspecified: Secondary | ICD-10-CM | POA: Diagnosis not present

## 2022-07-16 DIAGNOSIS — M48061 Spinal stenosis, lumbar region without neurogenic claudication: Secondary | ICD-10-CM | POA: Diagnosis not present

## 2022-07-16 LAB — URINALYSIS, ROUTINE W REFLEX MICROSCOPIC
Bilirubin Urine: NEGATIVE
Glucose, UA: NEGATIVE mg/dL
Hgb urine dipstick: NEGATIVE
Ketones, ur: 20 mg/dL — AB
Leukocytes,Ua: NEGATIVE
Nitrite: NEGATIVE
Protein, ur: NEGATIVE mg/dL
Specific Gravity, Urine: 1.015 (ref 1.005–1.030)
pH: 5 (ref 5.0–8.0)

## 2022-07-16 MED ORDER — ACETAMINOPHEN 500 MG PO TABS
1000.0000 mg | ORAL_TABLET | ORAL | Status: AC
  Administered 2022-07-16: 1000 mg via ORAL
  Filled 2022-07-16: qty 2

## 2022-07-16 NOTE — ED Triage Notes (Incomplete)
Patient BIB [facility] after she fell in her yard earlier today, EMS states patient had initially called for treatment of a possible sun burn from laying in her yard for two hours. Then patient started to complain of chronic pain.

## 2022-07-16 NOTE — ED Provider Triage Note (Signed)
Emergency Medicine Provider Triage Evaluation Note  Julie Leblanc , a 76 y.o. female  was evaluated in triage.  Pt complains of mechanical fall.  She states that she was walking barefoot on rocks and slipped and rolled down a hill.  She does not believe that she hit her head she does not have a headache.  She denies any chest pain she states she does not feel more weak than usual.  She states she has low back pain hip pain and left knee pain.  No other associate symptoms  Review of Systems  Positive: Back pain, L knee pain Negative: headache  Physical Exam  BP (!) 179/98 (BP Location: Left Arm)   Pulse 94   Temp 99.5 F (37.5 C) (Oral)   Resp 16   SpO2 94%  Gen:   Awake, no distress  Resp:  Normal effort  MSK:   Moves extremities without difficulty  Other:  T/L spine TTP some paraspinal msk ttp. No c spine TTP. Sensation intact all 4 extrem.  Moves all 4 extremities, left knee tenderness.  Medical Decision Making  Medically screening exam initiated at 6:58 PM.  Appropriate orders placed.  Hooria H Bartnick was informed that the remainder of the evaluation will be completed by another provider, this initial triage assessment does not replace that evaluation, and the importance of remaining in the ED until their evaluation is complete.  CT head, C-spine, T-spine, L-spine, DG pelvis, left knee   Tedd Sias, [place] 07/16/22 1900

## 2022-07-17 ENCOUNTER — Encounter (HOSPITAL_COMMUNITY): Payer: Self-pay

## 2022-07-17 ENCOUNTER — Other Ambulatory Visit: Payer: Self-pay

## 2022-07-17 MED ORDER — DICLOFENAC SODIUM 1 % EX GEL: 4.0000 g | Freq: Four times a day (QID) | CUTANEOUS | 0 refills | Status: AC

## 2022-07-17 NOTE — Discharge Instructions (Signed)
Your back pain is most likely due to a muscular strain.  There is been a lot of research on back pain, unfortunately the only thing that seems to really help is Tylenol and ibuprofen.  Relative rest is also important to not lift greater than 10 pounds bending or twisting at the waist.  Please follow-up with your family physician.  The other thing that really seems to benefit patients is physical therapy which your doctor may send you for.  Please return to the emergency department for new numbness or weakness to your arms or legs. Difficulty with urinating or urinating or pooping on yourself.  Also if you cannot feel toilet paper when you wipe or get a fever.   Take your pain medicines as prescribed by your primary doctor.  I have prescribed you a new gel that you can try to rub on the area that hurts you.  Please return for abdominal pain or inability eat or drink.

## 2022-07-17 NOTE — ED Provider Notes (Signed)
[facility] EMERGENCY DEPARTMENT Provider Note   CSN: [id number] Arrival date & time: 07/16/22  1659     History  Chief Complaint  Patient presents with   Back Pain    Julie Leblanc is a 76 y.o. female.  76 yo F with a chief complaints of back pain.  The patient states that she was walking down a hill when she slipped on some gravel and landed on her bottom.  She was unable to get up after she fell and ended up calling EMS.  They bypassed multiple hospital systems to come here for evaluation.  The patient was recently hospitalized for a common bile duct stone and had an ERCP.  She denies any abdominal pain.  Denies any nausea any vomiting.  She denies numbness or weakness to her legs.  Denies loss of bowel or bladder denies loss of peritoneal sensation.  She has medications that she takes for back pain at home but sometimes helps.  She was given a lidocaine patch when she arrived and she thinks it helped a little bit.   Back Pain      Home Medications Prior to Admission medications   Medication Sig Start Date End Date Taking? Authorizing Provider  diclofenac Sodium (VOLTAREN) 1 % GEL Apply 4 g topically 4 (four) times daily. 07/17/22  Yes Deno Etienne, DO  acetaminophen (TYLENOL) 650 MG CR tablet Take 1,300 mg by mouth in the morning and at bedtime.    [provider]  Alcohol Swabs (B-D SINGLE USE SWABS REGULAR) PADS Apply topically. 07/29/20   [provider]  ALPRAZolam Duanne Moron) 1 MG tablet Take 1 mg by mouth at bedtime.    [provider]  apixaban (ELIQUIS) 5 MG TABS tablet Take 5 mg by mouth 2 (two) times daily.    [provider]  aspirin EC 81 MG tablet Take 81 mg by mouth daily. Swallow whole.    [provider]  atorvastatin (LIPITOR) 40 MG tablet Take 40 mg by mouth daily.  10/16/14   [provider]  bisacodyl (DULCOLAX) 5 MG EC tablet Take 5 mg by mouth daily as needed for moderate constipation.     [provider]  celecoxib (CELEBREX) 200 MG capsule  08/25/20   [provider]  chlorthalidone (HYGROTON) 25 MG tablet Take 12.5 mg by mouth daily.    [provider]  diltiazem (CARDIZEM CD) 120 MG 24 hr capsule  08/25/20   [provider]  diltiazem (CARDIZEM SR) 120 MG 12 hr capsule Take 1 capsule (120 mg total) by mouth 2 (two) times daily. 08/21/13   Hilty, Nadean Corwin, MD  diphenhydramine-acetaminophen (TYLENOL PM) 25-500 MG TABS Take 1 tablet by mouth at bedtime.    [provider]  EPINEPHrine (EPI-PEN) 0.3 mg/0.3 mL SOAJ injection Inject 0.3 mg into the muscle once as needed for anaphylaxis (for peanut allergy).    [provider]  fenofibrate (TRICOR) 145 MG tablet Take 1 tablet (145 mg total) by mouth daily. 07/09/19   Pixie Casino, MD  fenofibrate micronized (LOFIBRA) 67 MG capsule  08/25/20   [provider]  furosemide (LASIX) 40 MG tablet Take 40 mg by mouth daily.    [provider]  gabapentin (NEURONTIN) 300 MG capsule Take 600 mg by mouth 3 (three) times daily.  03/08/13   [provider]  gabapentin (NEURONTIN) 400 MG capsule Take 400 mg by mouth. 08/06/20   [provider]  HYDROcodone-acetaminophen (NORCO/VICODIN) 5-325  MG tablet Take 1 tablet by mouth daily as needed. 08/20/20   [provider]  icosapent Ethyl (VASCEPA) 1 g capsule Take 2 g by mouth 2 (two) times daily.    [provider]  irbesartan (AVAPRO) 300 MG tablet Take 1 tablet (300 mg total) by mouth daily. 04/19/14   Alvstad, Casimiro Needle, RPH-CPP  levofloxacin (LEVAQUIN) 500 MG tablet Take 1 tablet (500 mg total) by mouth daily. 05/19/21   Landis Martins, DPM  Loratadine 10 MG CAPS Take 10 mg by mouth daily.  03/21/14   [provider]  metFORMIN (GLUCOPHAGE-XR) 750 MG 24 hr tablet Take 750 mg by mouth in the morning and at bedtime.     [provider]  metoprolol (LOPRESSOR) 50 MG tablet Take 50  mg by mouth 3 (three) times daily. 03/08/13   [provider]  Multiple Vitamin (MULTIVITAMIN WITH MINERALS) TABS Take 1 tablet by mouth daily.    [provider]  nitroGLYCERIN (NITROSTAT) 0.4 MG SL tablet Place 1 tablet (0.4 mg total) under the tongue every 5 (five) minutes as needed for chest pain. 11/07/17   Almyra Deforest, PA  Omega-3 1000 MG CAPS Take 1,000 mg by mouth daily.    [provider]  pantoprazole (PROTONIX) 40 MG tablet Take 40 mg by mouth daily.  03/20/20   [provider]  silver sulfADIAZINE (SILVADENE) 1 % cream Apply 1 application topically daily. 12/03/20   Landis Martins, DPM  tiZANidine (ZANAFLEX) 4 MG tablet Take 4 mg by mouth at bedtime. 03/08/13   [provider]  TraMADol HCl 150 MG CP24 Take 150 mg by mouth at bedtime.    [provider]  traZODone (DESYREL) 100 MG tablet Take 100 mg by mouth at bedtime. 10/16/14   [provider]  TRUE METRIX BLOOD GLUCOSE TEST test strip SMARTSIG:Via Meter 08/19/20   [provider]  TRUEplus Lancets 30G MISC  08/12/20   [provider]  venlafaxine XR (EFFEXOR-XR) 75 MG 24 hr capsule Take 75 mg by mouth daily.  09/27/13   [provider]      Allergies    Other, Peanut oil, and Penicillins    Review of Systems   Review of Systems  Musculoskeletal:  Positive for back pain.    Physical Exam Updated Vital Signs BP (!) 185/93   Pulse 94   Temp 98.8 F (37.1 C)   Resp 18   Ht '5\' 3"'$  (1.6 m)   Wt 88 kg   SpO2 100%   BMI 34.37 kg/m  Physical Exam Vitals and nursing note reviewed.  Constitutional:      General: She is not in acute distress.    Appearance: She is well-developed. She is not diaphoretic.  HENT:     Head: Normocephalic and atraumatic.  Eyes:     Pupils: Pupils are equal, round, and reactive to light.  Cardiovascular:     Rate and Rhythm: Normal rate and regular rhythm.     Heart sounds: No murmur heard.    No friction rub.  No gallop.  Pulmonary:     Effort: Pulmonary effort is normal.     Breath sounds: No wheezing or rales.  Abdominal:     General: There is no distension.     Palpations: Abdomen is soft.     Tenderness: There is no abdominal tenderness.  Musculoskeletal:        General: No tenderness.     Cervical back: Normal range of motion and  neck supple.     Comments: Patient is able to sit up of her own accord without any obvious discomfort.  Midline spinal tenderness step-offs or deformities.  Place motor and sensation intact in bilateral lower extremities.  Reflexes are 2+ and equal.  No clonus.  Skin:    General: Skin is warm and dry.  Neurological:     Mental Status: She is alert and oriented to person, place, and time.  Psychiatric:        Behavior: Behavior normal.     ED Results / Procedures / Treatments   Labs (all labs ordered are listed, but only abnormal results are displayed) Labs Reviewed  URINALYSIS, ROUTINE W REFLEX MICROSCOPIC - Abnormal; Notable for the following components:      Result Value   Ketones, ur 20 (*)    All other components within normal limits    EKG None  Radiology DG Hip Unilat W or Wo Pelvis 2-3 Views Left  Result Date: 07/16/2022 CLINICAL DATA:  Fall. EXAM: DG HIP (WITH OR WITHOUT PELVIS) 2-3V LEFT COMPARISON:  None Available. FINDINGS: The bones are mildly osteopenic. There is no evidence of hip fracture or dislocation. Joint spaces are maintained. There is a small amount of periosteal reaction along the lateral aspect of the proximal left femur without underlying cortical defect or overlying soft tissue abnormality. This may be related to old injury. Peripheral vascular calcifications are present IMPRESSION: 1. No acute fracture or dislocation. Electronically Signed   By: Ronney Asters M.D.   On: 07/16/2022 21:15   DG Hip Unilat W or Wo Pelvis 1 View Right  Result Date: 07/16/2022 CLINICAL DATA:  Fall with bilateral hip pain. EXAM: DG HIP (WITH OR  WITHOUT PELVIS) 1V RIGHT COMPARISON:  None Available. FINDINGS: There is no evidence of hip fracture or dislocation. Mild degenerative changes are present at the right hip. Vascular calcifications and a surgical clip are noted in the soft tissues in the right lower extremity. IMPRESSION: 1. No acute fracture or dislocation. 2. Mild degenerative changes at the right hip. Electronically Signed   By: Brett Fairy M.D.   On: 07/16/2022 21:11   CT Lumbar Spine Wo Contrast  Result Date: 07/16/2022 CLINICAL DATA:  Mechanical fall. Was walking barefoot on rocks and slipped and rolled down a hill. EXAM: CT LUMBAR SPINE WITHOUT CONTRAST TECHNIQUE: Multidetector CT imaging of the lumbar spine was performed without intravenous contrast administration. Multiplanar CT image reconstructions were also generated. RADIATION DOSE REDUCTION: This exam was performed according to the departmental dose-optimization program which includes automated exposure control, adjustment of the mA and/or kV according to patient size and/or use of iterative reconstruction technique. COMPARISON:  CT abdomen and pelvis 07/09/2022 FINDINGS: Segmentation: The first non rib-bearing vertebral body is considered L1. There is partial lumbarization of S1. Alignment: There is 4 mm grade 1 anterolisthesis of L5 on S1, unchanged from 07/09/2022 abdominal CT. High-grade L4-5 facet joint degenerative changes without a definite complete pars defect. Mild dextrocurvature centered at T12 and mild levocurvature centered at L4-5. Vertebrae: Vertebral body heights are maintained. No acute fracture is seen. Paraspinal and other soft tissues: High-grade atherosclerotic calcifications. No abdominal aortic aneurysm. Unchanged 4 mm right lower pole nonobstructing renal stone. Disc levels: Moderate posterior left L1-2, mild posterior right L3-4, and mild-to-moderate posterior L5-S1 disc space narrowing. L5-S1 degenerative vacuum disc. L1-2: No posterior disc bulge,  central canal narrowing, or neuroforaminal stenosis. L2-3: Sclerotic curvature and facet joint hypertrophy contribute to mild-to-moderate right neuroforaminal narrowing. L3-4:  Moderate bilateral facet joint hypertrophy. Minimal posterior disc osteophyte complex. Mild central canal stenosis. L4-5: Moderate bilateral facet joint hypertrophy. Moderate ligamentum flavum hypertrophy. Mild broad-based posterior disc osteophyte complex with right-greater-than-left intraforaminal extension. Mild-to-moderate right greater than left neuroforaminal stenosis. Moderate central canal stenosis. L5-S1: Severe bilateral facet joint hypertrophy. Moderate right neuroforaminal stenosis. Mild-to-moderate central canal stenosis. Moderate narrowing of the lateral recesses. IMPRESSION: 1. No acute fracture. 2. Multilevel degenerative disc and joint changes as above. Electronically Signed   By: Yvonne Kendall M.D.   On: 07/16/2022 20:53   CT Thoracic Spine Wo Contrast  Result Date: 07/16/2022 CLINICAL DATA:  Mid back pain. Mechanical fall. Was walking barefoot on rocks and slipped and rolled down a hill. EXAM: CT THORACIC SPINE WITHOUT CONTRAST TECHNIQUE: Multidetector CT images of the thoracic were obtained using the standard protocol without intravenous contrast. RADIATION DOSE REDUCTION: This exam was performed according to the departmental dose-optimization program which includes automated exposure control, adjustment of the mA and/or kV according to patient size and/or use of iterative reconstruction technique. COMPARISON:  CTA chest 11/22/2020 FINDINGS: Alignment: No sagittal spondylolisthesis. Mild levocurvature centered at T4-5 and minimal dextrocurvature centered at T11. Vertebrae: Vertebral body heights are maintained. Moderate to severe T3-4 through T10-11 disc space narrowing. Moderate anterior endplate osteophytes at C3-4 through T10-11. No acute fracture is seen. Paraspinal and other soft tissues: Mild high-grade  atherosclerotic calcifications within the thoracic aorta and coronary arteries. No thoracic aortic aneurysm. 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 The partially visualized aorta is enlarged. There are dense tricuspid calcifications. Disc levels: T8-9: Facet joint hypertrophy contributes to moderate right mild left neuroforaminal stenosis. T9-10: Disc space narrowing and facet joint hypertrophy contribute to moderate to severe bilateral neuroforaminal stenosis. T10-11: Mild-to-moderate left and mild right neuroforaminal narrowing secondary to facet joint hypertrophy. Mild posterior endplate spurring. IMPRESSION: 1. No acute fracture is seen. 2. Multilevel degenerative disc and joint changes. 3. 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 4. T8-9 moderate right and mild left neuroforaminal stenosis. 5. T10-11 mild-to-moderate left and mild right neuroforaminal stenosis. Electronically Signed   By: Yvonne Kendall M.D.   On: 07/16/2022 20:42   CT Cervical Spine Wo Contrast  Result Date: 07/16/2022 CLINICAL DATA:  Status post fall. EXAM: CT CERVICAL SPINE WITHOUT CONTRAST TECHNIQUE: Multidetector CT imaging of the cervical spine was performed without intravenous contrast. Multiplanar CT image reconstructions were also generated. RADIATION DOSE REDUCTION: This exam was performed according to the departmental dose-optimization program which includes automated exposure control, adjustment of the mA and/or kV according to patient size and/or use of iterative reconstruction technique. COMPARISON:  None Available. FINDINGS: Alignment: There is mild reversal of the normal cervical spine lordosis. 1 mm to 2 mm anterolisthesis of the C3 vertebral body on C4 is noted. Skull base and vertebrae: No acute fracture. No primary bone lesion or focal pathologic process. Soft tissues and spinal canal: No prevertebral fluid or swelling. No visible canal hematoma. Disc levels: Marked  severity endplate sclerosis, mild to moderate severity anterior osteophyte formation and posterior bony spurring are seen at the levels of C5-C6, C6-C7 and C7-T1. Mild endplate sclerosis is seen throughout the remainder of the cervical spine. There is marked severity narrowing of the anterior atlantoaxial articulation. Marked severity intervertebral disc space narrowing is seen at C5-C6, with moderate severity intervertebral disc space narrowing seen at C6-C7 and C7-T1. Bilateral marked severity multilevel facet joint hypertrophy is noted. Upper chest: Negative. Other: None. IMPRESSION: 1. No acute fracture  within the cervical spine. 2. Marked severity degenerative changes at the levels of C5-C6, C6-C7 and C7-T1. 3. 1 mm to 2 mm anterolisthesis of the C3 vertebral body on C4. Electronically Signed   By: Virgina Norfolk M.D.   On: 07/16/2022 20:13   CT HEAD WO CONTRAST (5MM)  Result Date: 07/16/2022 CLINICAL DATA:  Slipped and fell EXAM: CT HEAD WITHOUT CONTRAST TECHNIQUE: Contiguous axial images were obtained from the base of the skull through the vertex without intravenous contrast. RADIATION DOSE REDUCTION: This exam was performed according to the departmental dose-optimization program which includes automated exposure control, adjustment of the mA and/or kV according to patient size and/or use of iterative reconstruction technique. COMPARISON:  07/12/2022 FINDINGS: Brain: No acute infarct or hemorrhage. Lateral ventricles and midline structures are unremarkable. No acute extra-axial fluid collections. No mass effect. Vascular: Stable atherosclerosis.  No hyperdense vessel. Skull: Normal. Negative for fracture or focal lesion. Sinuses/Orbits: No acute finding. Other: None. IMPRESSION: 1. Stable head CT, no acute intracranial process. Electronically Signed   By: Randa Ngo M.D.   On: 07/16/2022 20:12    Procedures Procedures    Medications Ordered in ED Medications  acetaminophen (TYLENOL) tablet  1,000 mg (1,000 mg Oral Given 07/16/22 1917)    ED Course/ Medical Decision Making/ A&P                           Medical Decision Making  76 yo F with a chief complaints of low back pain.  This has been an ongoing issue for her.  On my record review she was recently hospitalized for a common bile duct stone and had an ERCP performed.  Most all of her notes seem to be focused on her chronic back pain.  There are attempts to offer her MRI which she declined at that time. My read of the note she had no red flags other than her age.  Patient has a benign exam here.  She had CT imaging ordered in the [facility]SE process.  No acute findings on my read.  Will discharge home.  Have appoint with family doctor in the office.   10:25 AM:  I have discussed the diagnosis/risks/treatment options with the patient and family.  Evaluation and diagnostic testing in the emergency department does not suggest an emergent condition requiring admission or immediate intervention beyond what has been performed at this time.  They will follow up with PCP. We also discussed returning to the ED immediately if new or worsening sx occur. We discussed the sx which are most concerning (e.g., sudden worsening pain, fever, inability to tolerate by mouth, cauda equina s/sx) that necessitate immediate return. Medications administered to the patient during their visit and any new prescriptions provided to the patient are listed below.  Medications given during this visit Medications  acetaminophen (TYLENOL) tablet 1,000 mg (1,000 mg Oral Given 07/16/22 1917)     The patient appears reasonably screen and/or stabilized for discharge and I doubt any other medical condition or other [facility] requiring further screening, evaluation, or treatment in the ED at this time prior to discharge.          Final Clinical Impression(s) / ED Diagnoses Final diagnoses:  Acute left-sided low back pain without sciatica    Rx / DC Orders ED  Discharge Orders          Ordered    diclofenac Sodium (VOLTAREN) 1 % GEL  4 times daily  07/17/22 [place], DO 07/17/22 1025

## 2022-07-20 DIAGNOSIS — I10 Essential (primary) hypertension: Secondary | ICD-10-CM | POA: Diagnosis not present

## 2022-07-20 DIAGNOSIS — E1169 Type 2 diabetes mellitus with other specified complication: Secondary | ICD-10-CM | POA: Diagnosis not present

## 2022-07-20 DIAGNOSIS — K219 Gastro-esophageal reflux disease without esophagitis: Secondary | ICD-10-CM | POA: Diagnosis not present

## 2022-08-18 DIAGNOSIS — Z7982 Long term (current) use of aspirin: Secondary | ICD-10-CM | POA: Diagnosis not present

## 2022-08-18 DIAGNOSIS — R531 Weakness: Secondary | ICD-10-CM | POA: Diagnosis not present

## 2022-08-18 DIAGNOSIS — I10 Essential (primary) hypertension: Secondary | ICD-10-CM | POA: Diagnosis not present

## 2022-08-18 DIAGNOSIS — R110 Nausea: Secondary | ICD-10-CM | POA: Diagnosis not present

## 2022-08-18 DIAGNOSIS — Z7984 Long term (current) use of oral hypoglycemic drugs: Secondary | ICD-10-CM | POA: Diagnosis not present

## 2022-08-18 DIAGNOSIS — I959 Hypotension, unspecified: Secondary | ICD-10-CM | POA: Diagnosis not present

## 2022-08-18 DIAGNOSIS — Z23 Encounter for immunization: Secondary | ICD-10-CM | POA: Diagnosis not present

## 2022-08-18 DIAGNOSIS — I4891 Unspecified atrial fibrillation: Secondary | ICD-10-CM | POA: Diagnosis not present

## 2022-08-18 DIAGNOSIS — E876 Hypokalemia: Secondary | ICD-10-CM | POA: Diagnosis not present

## 2022-08-18 DIAGNOSIS — R112 Nausea with vomiting, unspecified: Secondary | ICD-10-CM | POA: Diagnosis not present

## 2022-08-18 DIAGNOSIS — Z79899 Other long term (current) drug therapy: Secondary | ICD-10-CM | POA: Diagnosis not present

## 2022-08-18 DIAGNOSIS — E118 Type 2 diabetes mellitus with unspecified complications: Secondary | ICD-10-CM | POA: Diagnosis not present

## 2022-08-18 DIAGNOSIS — K529 Noninfective gastroenteritis and colitis, unspecified: Secondary | ICD-10-CM | POA: Diagnosis not present

## 2022-08-19 DIAGNOSIS — E876 Hypokalemia: Secondary | ICD-10-CM | POA: Diagnosis not present

## 2022-08-19 DIAGNOSIS — R112 Nausea with vomiting, unspecified: Secondary | ICD-10-CM | POA: Diagnosis not present

## 2022-08-20 DIAGNOSIS — R109 Unspecified abdominal pain: Secondary | ICD-10-CM | POA: Diagnosis not present

## 2022-08-20 DIAGNOSIS — E876 Hypokalemia: Secondary | ICD-10-CM | POA: Diagnosis not present

## 2022-08-20 DIAGNOSIS — R112 Nausea with vomiting, unspecified: Secondary | ICD-10-CM | POA: Diagnosis not present

## 2022-09-03 DIAGNOSIS — E876 Hypokalemia: Secondary | ICD-10-CM | POA: Diagnosis not present

## 2022-09-03 DIAGNOSIS — R1110 Vomiting, unspecified: Secondary | ICD-10-CM | POA: Diagnosis not present

## 2022-09-03 DIAGNOSIS — I10 Essential (primary) hypertension: Secondary | ICD-10-CM | POA: Diagnosis not present

## 2022-09-03 DIAGNOSIS — I2510 Atherosclerotic heart disease of native coronary artery without angina pectoris: Secondary | ICD-10-CM | POA: Diagnosis not present

## 2022-09-03 DIAGNOSIS — E7800 Pure hypercholesterolemia, unspecified: Secondary | ICD-10-CM | POA: Diagnosis not present

## 2022-09-03 DIAGNOSIS — I700 Atherosclerosis of aorta: Secondary | ICD-10-CM | POA: Diagnosis not present

## 2022-09-03 DIAGNOSIS — I4891 Unspecified atrial fibrillation: Secondary | ICD-10-CM | POA: Diagnosis not present

## 2022-09-03 DIAGNOSIS — M1990 Unspecified osteoarthritis, unspecified site: Secondary | ICD-10-CM | POA: Diagnosis not present

## 2022-09-03 DIAGNOSIS — D649 Anemia, unspecified: Secondary | ICD-10-CM | POA: Diagnosis not present

## 2022-09-03 DIAGNOSIS — K219 Gastro-esophageal reflux disease without esophagitis: Secondary | ICD-10-CM | POA: Diagnosis not present

## 2022-09-18 DIAGNOSIS — R109 Unspecified abdominal pain: Secondary | ICD-10-CM | POA: Diagnosis not present

## 2022-09-18 DIAGNOSIS — N200 Calculus of kidney: Secondary | ICD-10-CM | POA: Diagnosis not present

## 2022-09-18 DIAGNOSIS — Z20822 Contact with and (suspected) exposure to covid-19: Secondary | ICD-10-CM | POA: Diagnosis not present

## 2022-09-18 DIAGNOSIS — N3001 Acute cystitis with hematuria: Secondary | ICD-10-CM | POA: Diagnosis not present

## 2022-09-18 DIAGNOSIS — R079 Chest pain, unspecified: Secondary | ICD-10-CM | POA: Diagnosis not present

## 2022-09-18 DIAGNOSIS — R112 Nausea with vomiting, unspecified: Secondary | ICD-10-CM | POA: Diagnosis not present

## 2022-09-18 DIAGNOSIS — R1110 Vomiting, unspecified: Secondary | ICD-10-CM | POA: Diagnosis not present

## 2022-09-18 DIAGNOSIS — Z79899 Other long term (current) drug therapy: Secondary | ICD-10-CM | POA: Diagnosis not present

## 2022-09-18 DIAGNOSIS — Z7982 Long term (current) use of aspirin: Secondary | ICD-10-CM | POA: Diagnosis not present

## 2022-09-23 DIAGNOSIS — F339 Major depressive disorder, recurrent, unspecified: Secondary | ICD-10-CM | POA: Diagnosis not present

## 2022-09-23 DIAGNOSIS — R112 Nausea with vomiting, unspecified: Secondary | ICD-10-CM | POA: Diagnosis not present

## 2022-09-23 DIAGNOSIS — Z6828 Body mass index (BMI) 28.0-28.9, adult: Secondary | ICD-10-CM | POA: Diagnosis not present

## 2022-09-23 DIAGNOSIS — E876 Hypokalemia: Secondary | ICD-10-CM | POA: Diagnosis not present

## 2022-09-23 DIAGNOSIS — N390 Urinary tract infection, site not specified: Secondary | ICD-10-CM | POA: Diagnosis not present

## 2022-10-08 DIAGNOSIS — R112 Nausea with vomiting, unspecified: Secondary | ICD-10-CM | POA: Diagnosis not present

## 2022-10-08 DIAGNOSIS — Z6827 Body mass index (BMI) 27.0-27.9, adult: Secondary | ICD-10-CM | POA: Diagnosis not present

## 2022-10-08 DIAGNOSIS — G4700 Insomnia, unspecified: Secondary | ICD-10-CM | POA: Diagnosis not present

## 2022-10-29 DIAGNOSIS — I87313 Chronic venous hypertension (idiopathic) with ulcer of bilateral lower extremity: Secondary | ICD-10-CM | POA: Diagnosis not present

## 2022-10-31 DIAGNOSIS — I87313 Chronic venous hypertension (idiopathic) with ulcer of bilateral lower extremity: Secondary | ICD-10-CM | POA: Diagnosis not present

## 2022-11-03 DIAGNOSIS — L97909 Non-pressure chronic ulcer of unspecified part of unspecified lower leg with unspecified severity: Secondary | ICD-10-CM | POA: Diagnosis not present

## 2022-12-09 DIAGNOSIS — I513 Intracardiac thrombosis, not elsewhere classified: Secondary | ICD-10-CM | POA: Diagnosis not present

## 2022-12-09 DIAGNOSIS — W19XXXA Unspecified fall, initial encounter: Secondary | ICD-10-CM | POA: Diagnosis not present

## 2022-12-09 DIAGNOSIS — W1831XA Fall on same level due to stepping on an object, initial encounter: Secondary | ICD-10-CM | POA: Diagnosis not present

## 2022-12-09 DIAGNOSIS — J90 Pleural effusion, not elsewhere classified: Secondary | ICD-10-CM | POA: Diagnosis not present

## 2022-12-09 DIAGNOSIS — R55 Syncope and collapse: Secondary | ICD-10-CM | POA: Diagnosis not present

## 2022-12-09 DIAGNOSIS — S0990XA Unspecified injury of head, initial encounter: Secondary | ICD-10-CM | POA: Diagnosis not present

## 2022-12-09 DIAGNOSIS — I491 Atrial premature depolarization: Secondary | ICD-10-CM | POA: Diagnosis not present

## 2022-12-09 DIAGNOSIS — R0902 Hypoxemia: Secondary | ICD-10-CM | POA: Diagnosis not present

## 2022-12-09 DIAGNOSIS — R1031 Right lower quadrant pain: Secondary | ICD-10-CM | POA: Diagnosis not present

## 2022-12-09 DIAGNOSIS — I10 Essential (primary) hypertension: Secondary | ICD-10-CM | POA: Diagnosis not present

## 2022-12-09 DIAGNOSIS — K5730 Diverticulosis of large intestine without perforation or abscess without bleeding: Secondary | ICD-10-CM | POA: Diagnosis not present

## 2022-12-09 DIAGNOSIS — Z043 Encounter for examination and observation following other accident: Secondary | ICD-10-CM | POA: Diagnosis not present

## 2022-12-09 DIAGNOSIS — S2249XA Multiple fractures of ribs, unspecified side, initial encounter for closed fracture: Secondary | ICD-10-CM | POA: Diagnosis not present

## 2022-12-09 DIAGNOSIS — N201 Calculus of ureter: Secondary | ICD-10-CM | POA: Diagnosis not present

## 2022-12-09 DIAGNOSIS — N3289 Other specified disorders of bladder: Secondary | ICD-10-CM | POA: Diagnosis not present

## 2022-12-09 DIAGNOSIS — S2241XA Multiple fractures of ribs, right side, initial encounter for closed fracture: Secondary | ICD-10-CM | POA: Diagnosis not present

## 2022-12-10 DIAGNOSIS — S2232XA Fracture of one rib, left side, initial encounter for closed fracture: Secondary | ICD-10-CM | POA: Diagnosis not present

## 2022-12-10 DIAGNOSIS — K219 Gastro-esophageal reflux disease without esophagitis: Secondary | ICD-10-CM | POA: Diagnosis not present

## 2022-12-10 DIAGNOSIS — K5900 Constipation, unspecified: Secondary | ICD-10-CM | POA: Diagnosis not present

## 2022-12-10 DIAGNOSIS — F05 Delirium due to known physiological condition: Secondary | ICD-10-CM | POA: Diagnosis not present

## 2022-12-10 DIAGNOSIS — L89152 Pressure ulcer of sacral region, stage 2: Secondary | ICD-10-CM | POA: Diagnosis not present

## 2022-12-10 DIAGNOSIS — R918 Other nonspecific abnormal finding of lung field: Secondary | ICD-10-CM | POA: Diagnosis not present

## 2022-12-10 DIAGNOSIS — J810 Acute pulmonary edema: Secondary | ICD-10-CM | POA: Diagnosis not present

## 2022-12-10 DIAGNOSIS — S2241XA Multiple fractures of ribs, right side, initial encounter for closed fracture: Secondary | ICD-10-CM | POA: Diagnosis not present

## 2022-12-10 DIAGNOSIS — S2249XA Multiple fractures of ribs, unspecified side, initial encounter for closed fracture: Secondary | ICD-10-CM | POA: Diagnosis not present

## 2022-12-10 DIAGNOSIS — S2241XD Multiple fractures of ribs, right side, subsequent encounter for fracture with routine healing: Secondary | ICD-10-CM | POA: Diagnosis not present

## 2022-12-10 DIAGNOSIS — R1031 Right lower quadrant pain: Secondary | ICD-10-CM | POA: Diagnosis not present

## 2022-12-10 DIAGNOSIS — I4891 Unspecified atrial fibrillation: Secondary | ICD-10-CM | POA: Diagnosis not present

## 2022-12-10 DIAGNOSIS — M542 Cervicalgia: Secondary | ICD-10-CM | POA: Diagnosis not present

## 2022-12-10 DIAGNOSIS — R531 Weakness: Secondary | ICD-10-CM | POA: Diagnosis not present

## 2022-12-10 DIAGNOSIS — R140 Abdominal distension (gaseous): Secondary | ICD-10-CM | POA: Diagnosis not present

## 2022-12-10 DIAGNOSIS — E46 Unspecified protein-calorie malnutrition: Secondary | ICD-10-CM | POA: Diagnosis not present

## 2022-12-10 DIAGNOSIS — I493 Ventricular premature depolarization: Secondary | ICD-10-CM | POA: Diagnosis not present

## 2022-12-10 DIAGNOSIS — J189 Pneumonia, unspecified organism: Secondary | ICD-10-CM | POA: Diagnosis not present

## 2022-12-10 DIAGNOSIS — W1831XA Fall on same level due to stepping on an object, initial encounter: Secondary | ICD-10-CM | POA: Diagnosis not present

## 2022-12-10 DIAGNOSIS — I110 Hypertensive heart disease with heart failure: Secondary | ICD-10-CM | POA: Diagnosis not present

## 2022-12-10 DIAGNOSIS — N390 Urinary tract infection, site not specified: Secondary | ICD-10-CM | POA: Diagnosis not present

## 2022-12-10 DIAGNOSIS — I5031 Acute diastolic (congestive) heart failure: Secondary | ICD-10-CM | POA: Diagnosis not present

## 2022-12-10 DIAGNOSIS — R102 Pelvic and perineal pain: Secondary | ICD-10-CM | POA: Diagnosis not present

## 2022-12-10 DIAGNOSIS — R0902 Hypoxemia: Secondary | ICD-10-CM | POA: Diagnosis not present

## 2022-12-10 DIAGNOSIS — M810 Age-related osteoporosis without current pathological fracture: Secondary | ICD-10-CM | POA: Diagnosis not present

## 2022-12-10 DIAGNOSIS — I513 Intracardiac thrombosis, not elsewhere classified: Secondary | ICD-10-CM | POA: Diagnosis not present

## 2022-12-10 DIAGNOSIS — E1140 Type 2 diabetes mellitus with diabetic neuropathy, unspecified: Secondary | ICD-10-CM | POA: Diagnosis not present

## 2022-12-10 DIAGNOSIS — I748 Embolism and thrombosis of other arteries: Secondary | ICD-10-CM | POA: Diagnosis not present

## 2022-12-10 DIAGNOSIS — Z743 Need for continuous supervision: Secondary | ICD-10-CM | POA: Diagnosis not present

## 2022-12-10 DIAGNOSIS — Z7901 Long term (current) use of anticoagulants: Secondary | ICD-10-CM | POA: Diagnosis not present

## 2022-12-10 DIAGNOSIS — Z8659 Personal history of other mental and behavioral disorders: Secondary | ICD-10-CM | POA: Diagnosis not present

## 2022-12-10 DIAGNOSIS — I10 Essential (primary) hypertension: Secondary | ICD-10-CM | POA: Diagnosis not present

## 2022-12-10 DIAGNOSIS — E119 Type 2 diabetes mellitus without complications: Secondary | ICD-10-CM | POA: Diagnosis not present

## 2022-12-10 DIAGNOSIS — N201 Calculus of ureter: Secondary | ICD-10-CM | POA: Diagnosis not present

## 2022-12-10 DIAGNOSIS — R410 Disorientation, unspecified: Secondary | ICD-10-CM | POA: Diagnosis not present

## 2022-12-10 DIAGNOSIS — J90 Pleural effusion, not elsewhere classified: Secondary | ICD-10-CM | POA: Diagnosis not present

## 2022-12-10 DIAGNOSIS — I350 Nonrheumatic aortic (valve) stenosis: Secondary | ICD-10-CM | POA: Diagnosis not present

## 2022-12-11 DIAGNOSIS — S2249XA Multiple fractures of ribs, unspecified side, initial encounter for closed fracture: Secondary | ICD-10-CM | POA: Diagnosis not present

## 2022-12-11 DIAGNOSIS — I748 Embolism and thrombosis of other arteries: Secondary | ICD-10-CM | POA: Diagnosis not present

## 2022-12-11 DIAGNOSIS — E119 Type 2 diabetes mellitus without complications: Secondary | ICD-10-CM | POA: Diagnosis not present

## 2022-12-11 DIAGNOSIS — I10 Essential (primary) hypertension: Secondary | ICD-10-CM | POA: Diagnosis not present

## 2022-12-11 DIAGNOSIS — Z7901 Long term (current) use of anticoagulants: Secondary | ICD-10-CM | POA: Diagnosis not present

## 2022-12-11 DIAGNOSIS — I350 Nonrheumatic aortic (valve) stenosis: Secondary | ICD-10-CM | POA: Diagnosis not present

## 2022-12-11 DIAGNOSIS — I4891 Unspecified atrial fibrillation: Secondary | ICD-10-CM | POA: Diagnosis not present

## 2022-12-11 DIAGNOSIS — E46 Unspecified protein-calorie malnutrition: Secondary | ICD-10-CM | POA: Diagnosis not present

## 2022-12-11 DIAGNOSIS — K219 Gastro-esophageal reflux disease without esophagitis: Secondary | ICD-10-CM | POA: Diagnosis not present

## 2022-12-13 DIAGNOSIS — J810 Acute pulmonary edema: Secondary | ICD-10-CM | POA: Diagnosis not present

## 2022-12-13 DIAGNOSIS — R918 Other nonspecific abnormal finding of lung field: Secondary | ICD-10-CM | POA: Diagnosis not present

## 2022-12-13 DIAGNOSIS — J90 Pleural effusion, not elsewhere classified: Secondary | ICD-10-CM | POA: Diagnosis not present

## 2022-12-14 DIAGNOSIS — R410 Disorientation, unspecified: Secondary | ICD-10-CM | POA: Diagnosis not present

## 2022-12-14 DIAGNOSIS — Z8659 Personal history of other mental and behavioral disorders: Secondary | ICD-10-CM | POA: Diagnosis not present

## 2022-12-17 DIAGNOSIS — R140 Abdominal distension (gaseous): Secondary | ICD-10-CM | POA: Diagnosis not present

## 2022-12-17 DIAGNOSIS — K5900 Constipation, unspecified: Secondary | ICD-10-CM | POA: Diagnosis not present

## 2022-12-18 DIAGNOSIS — S2241XD Multiple fractures of ribs, right side, subsequent encounter for fracture with routine healing: Secondary | ICD-10-CM | POA: Diagnosis not present

## 2022-12-18 DIAGNOSIS — I513 Intracardiac thrombosis, not elsewhere classified: Secondary | ICD-10-CM | POA: Diagnosis not present

## 2022-12-18 DIAGNOSIS — F419 Anxiety disorder, unspecified: Secondary | ICD-10-CM | POA: Diagnosis not present

## 2022-12-18 DIAGNOSIS — R531 Weakness: Secondary | ICD-10-CM | POA: Diagnosis not present

## 2022-12-18 DIAGNOSIS — Z743 Need for continuous supervision: Secondary | ICD-10-CM | POA: Diagnosis not present

## 2022-12-18 DIAGNOSIS — E1140 Type 2 diabetes mellitus with diabetic neuropathy, unspecified: Secondary | ICD-10-CM | POA: Diagnosis not present

## 2022-12-18 DIAGNOSIS — R112 Nausea with vomiting, unspecified: Secondary | ICD-10-CM | POA: Diagnosis not present

## 2022-12-18 DIAGNOSIS — R0602 Shortness of breath: Secondary | ICD-10-CM | POA: Diagnosis not present

## 2022-12-18 DIAGNOSIS — I959 Hypotension, unspecified: Secondary | ICD-10-CM | POA: Diagnosis not present

## 2022-12-18 DIAGNOSIS — E785 Hyperlipidemia, unspecified: Secondary | ICD-10-CM | POA: Diagnosis not present

## 2022-12-18 DIAGNOSIS — J90 Pleural effusion, not elsewhere classified: Secondary | ICD-10-CM | POA: Diagnosis not present

## 2022-12-18 DIAGNOSIS — K219 Gastro-esophageal reflux disease without esophagitis: Secondary | ICD-10-CM | POA: Diagnosis not present

## 2022-12-18 DIAGNOSIS — W19XXXD Unspecified fall, subsequent encounter: Secondary | ICD-10-CM | POA: Diagnosis not present

## 2022-12-18 DIAGNOSIS — K5900 Constipation, unspecified: Secondary | ICD-10-CM | POA: Diagnosis not present

## 2022-12-18 DIAGNOSIS — J984 Other disorders of lung: Secondary | ICD-10-CM | POA: Diagnosis not present

## 2022-12-18 DIAGNOSIS — I2510 Atherosclerotic heart disease of native coronary artery without angina pectoris: Secondary | ICD-10-CM | POA: Diagnosis not present

## 2022-12-18 DIAGNOSIS — I4891 Unspecified atrial fibrillation: Secondary | ICD-10-CM | POA: Diagnosis not present

## 2022-12-18 DIAGNOSIS — E119 Type 2 diabetes mellitus without complications: Secondary | ICD-10-CM | POA: Diagnosis not present

## 2022-12-18 DIAGNOSIS — N390 Urinary tract infection, site not specified: Secondary | ICD-10-CM | POA: Diagnosis not present

## 2022-12-18 DIAGNOSIS — I10 Essential (primary) hypertension: Secondary | ICD-10-CM | POA: Diagnosis not present

## 2022-12-18 DIAGNOSIS — I480 Paroxysmal atrial fibrillation: Secondary | ICD-10-CM | POA: Diagnosis not present

## 2022-12-18 DIAGNOSIS — R410 Disorientation, unspecified: Secondary | ICD-10-CM | POA: Diagnosis not present

## 2022-12-18 DIAGNOSIS — D72829 Elevated white blood cell count, unspecified: Secondary | ICD-10-CM | POA: Diagnosis not present

## 2022-12-18 DIAGNOSIS — M810 Age-related osteoporosis without current pathological fracture: Secondary | ICD-10-CM | POA: Diagnosis not present

## 2022-12-18 DIAGNOSIS — J189 Pneumonia, unspecified organism: Secondary | ICD-10-CM | POA: Diagnosis not present

## 2022-12-18 DIAGNOSIS — S2249XA Multiple fractures of ribs, unspecified side, initial encounter for closed fracture: Secondary | ICD-10-CM | POA: Diagnosis not present

## 2022-12-18 DIAGNOSIS — E559 Vitamin D deficiency, unspecified: Secondary | ICD-10-CM | POA: Diagnosis not present

## 2022-12-18 DIAGNOSIS — R52 Pain, unspecified: Secondary | ICD-10-CM | POA: Diagnosis not present

## 2022-12-18 DIAGNOSIS — Z66 Do not resuscitate: Secondary | ICD-10-CM | POA: Diagnosis not present

## 2022-12-18 DIAGNOSIS — Z79899 Other long term (current) drug therapy: Secondary | ICD-10-CM | POA: Diagnosis not present

## 2022-12-18 DIAGNOSIS — R001 Bradycardia, unspecified: Secondary | ICD-10-CM | POA: Diagnosis not present

## 2022-12-18 DIAGNOSIS — Z5181 Encounter for therapeutic drug level monitoring: Secondary | ICD-10-CM | POA: Diagnosis not present

## 2022-12-20 DIAGNOSIS — E119 Type 2 diabetes mellitus without complications: Secondary | ICD-10-CM | POA: Diagnosis not present

## 2022-12-20 DIAGNOSIS — E559 Vitamin D deficiency, unspecified: Secondary | ICD-10-CM | POA: Diagnosis not present

## 2022-12-20 DIAGNOSIS — Z79899 Other long term (current) drug therapy: Secondary | ICD-10-CM | POA: Diagnosis not present

## 2022-12-20 DIAGNOSIS — J189 Pneumonia, unspecified organism: Secondary | ICD-10-CM | POA: Diagnosis not present

## 2022-12-20 DIAGNOSIS — E1140 Type 2 diabetes mellitus with diabetic neuropathy, unspecified: Secondary | ICD-10-CM | POA: Diagnosis not present

## 2022-12-20 DIAGNOSIS — K219 Gastro-esophageal reflux disease without esophagitis: Secondary | ICD-10-CM | POA: Diagnosis not present

## 2022-12-20 DIAGNOSIS — S2241XD Multiple fractures of ribs, right side, subsequent encounter for fracture with routine healing: Secondary | ICD-10-CM | POA: Diagnosis not present

## 2022-12-21 DIAGNOSIS — R0602 Shortness of breath: Secondary | ICD-10-CM | POA: Diagnosis not present

## 2022-12-21 DIAGNOSIS — D72829 Elevated white blood cell count, unspecified: Secondary | ICD-10-CM | POA: Diagnosis not present

## 2022-12-21 DIAGNOSIS — S2241XD Multiple fractures of ribs, right side, subsequent encounter for fracture with routine healing: Secondary | ICD-10-CM | POA: Diagnosis not present

## 2022-12-22 DIAGNOSIS — Z5181 Encounter for therapeutic drug level monitoring: Secondary | ICD-10-CM | POA: Diagnosis not present

## 2022-12-22 DIAGNOSIS — J90 Pleural effusion, not elsewhere classified: Secondary | ICD-10-CM | POA: Diagnosis not present

## 2022-12-22 DIAGNOSIS — D72829 Elevated white blood cell count, unspecified: Secondary | ICD-10-CM | POA: Diagnosis not present

## 2022-12-22 DIAGNOSIS — W19XXXD Unspecified fall, subsequent encounter: Secondary | ICD-10-CM | POA: Diagnosis not present

## 2022-12-22 DIAGNOSIS — I4891 Unspecified atrial fibrillation: Secondary | ICD-10-CM | POA: Diagnosis not present

## 2022-12-22 DIAGNOSIS — R52 Pain, unspecified: Secondary | ICD-10-CM | POA: Diagnosis not present

## 2022-12-22 DIAGNOSIS — J984 Other disorders of lung: Secondary | ICD-10-CM | POA: Diagnosis not present

## 2022-12-31 DIAGNOSIS — F419 Anxiety disorder, unspecified: Secondary | ICD-10-CM | POA: Diagnosis not present

## 2022-12-31 DIAGNOSIS — S2241XD Multiple fractures of ribs, right side, subsequent encounter for fracture with routine healing: Secondary | ICD-10-CM | POA: Diagnosis not present

## 2022-12-31 DIAGNOSIS — R410 Disorientation, unspecified: Secondary | ICD-10-CM | POA: Diagnosis not present

## 2023-01-01 DIAGNOSIS — I10 Essential (primary) hypertension: Secondary | ICD-10-CM | POA: Diagnosis not present

## 2023-01-01 DIAGNOSIS — M810 Age-related osteoporosis without current pathological fracture: Secondary | ICD-10-CM | POA: Diagnosis not present

## 2023-01-01 DIAGNOSIS — E1140 Type 2 diabetes mellitus with diabetic neuropathy, unspecified: Secondary | ICD-10-CM | POA: Diagnosis not present

## 2023-01-01 DIAGNOSIS — J90 Pleural effusion, not elsewhere classified: Secondary | ICD-10-CM | POA: Diagnosis not present

## 2023-01-01 DIAGNOSIS — J189 Pneumonia, unspecified organism: Secondary | ICD-10-CM | POA: Diagnosis not present

## 2023-01-01 DIAGNOSIS — Z743 Need for continuous supervision: Secondary | ICD-10-CM | POA: Diagnosis not present

## 2023-01-01 DIAGNOSIS — R001 Bradycardia, unspecified: Secondary | ICD-10-CM | POA: Diagnosis not present

## 2023-01-01 DIAGNOSIS — N390 Urinary tract infection, site not specified: Secondary | ICD-10-CM | POA: Diagnosis not present

## 2023-01-01 DIAGNOSIS — E785 Hyperlipidemia, unspecified: Secondary | ICD-10-CM | POA: Diagnosis not present

## 2023-01-01 DIAGNOSIS — I4891 Unspecified atrial fibrillation: Secondary | ICD-10-CM | POA: Diagnosis not present

## 2023-01-01 DIAGNOSIS — I2510 Atherosclerotic heart disease of native coronary artery without angina pectoris: Secondary | ICD-10-CM | POA: Diagnosis not present

## 2023-01-01 DIAGNOSIS — I959 Hypotension, unspecified: Secondary | ICD-10-CM | POA: Diagnosis not present

## 2023-01-01 DIAGNOSIS — I517 Cardiomegaly: Secondary | ICD-10-CM | POA: Diagnosis not present

## 2023-01-01 DIAGNOSIS — I513 Intracardiac thrombosis, not elsewhere classified: Secondary | ICD-10-CM | POA: Diagnosis not present

## 2023-01-01 DIAGNOSIS — K219 Gastro-esophageal reflux disease without esophagitis: Secondary | ICD-10-CM | POA: Diagnosis not present

## 2023-01-01 DIAGNOSIS — R531 Weakness: Secondary | ICD-10-CM | POA: Diagnosis not present

## 2023-01-01 DIAGNOSIS — Z66 Do not resuscitate: Secondary | ICD-10-CM | POA: Diagnosis not present

## 2023-01-01 DIAGNOSIS — R2689 Other abnormalities of gait and mobility: Secondary | ICD-10-CM | POA: Diagnosis not present

## 2023-01-01 DIAGNOSIS — S2241XD Multiple fractures of ribs, right side, subsequent encounter for fracture with routine healing: Secondary | ICD-10-CM | POA: Diagnosis not present

## 2023-01-01 DIAGNOSIS — R112 Nausea with vomiting, unspecified: Secondary | ICD-10-CM | POA: Diagnosis not present

## 2023-01-01 DIAGNOSIS — I480 Paroxysmal atrial fibrillation: Secondary | ICD-10-CM | POA: Diagnosis not present

## 2023-01-01 DIAGNOSIS — K5900 Constipation, unspecified: Secondary | ICD-10-CM | POA: Diagnosis not present

## 2023-01-01 DIAGNOSIS — E119 Type 2 diabetes mellitus without complications: Secondary | ICD-10-CM | POA: Diagnosis not present

## 2023-01-01 DIAGNOSIS — I482 Chronic atrial fibrillation, unspecified: Secondary | ICD-10-CM | POA: Diagnosis not present

## 2023-01-02 DIAGNOSIS — I10 Essential (primary) hypertension: Secondary | ICD-10-CM | POA: Diagnosis not present

## 2023-01-02 DIAGNOSIS — E119 Type 2 diabetes mellitus without complications: Secondary | ICD-10-CM | POA: Diagnosis not present

## 2023-01-02 DIAGNOSIS — I480 Paroxysmal atrial fibrillation: Secondary | ICD-10-CM | POA: Diagnosis not present

## 2023-01-02 DIAGNOSIS — I959 Hypotension, unspecified: Secondary | ICD-10-CM | POA: Diagnosis not present

## 2023-01-02 DIAGNOSIS — J189 Pneumonia, unspecified organism: Secondary | ICD-10-CM | POA: Diagnosis not present

## 2023-01-02 DIAGNOSIS — I4891 Unspecified atrial fibrillation: Secondary | ICD-10-CM | POA: Diagnosis not present

## 2023-01-03 DIAGNOSIS — I517 Cardiomegaly: Secondary | ICD-10-CM | POA: Diagnosis not present

## 2023-01-03 DIAGNOSIS — I959 Hypotension, unspecified: Secondary | ICD-10-CM | POA: Diagnosis not present

## 2023-01-03 DIAGNOSIS — I482 Chronic atrial fibrillation, unspecified: Secondary | ICD-10-CM | POA: Diagnosis not present

## 2023-01-04 DIAGNOSIS — I959 Hypotension, unspecified: Secondary | ICD-10-CM | POA: Diagnosis not present

## 2023-01-05 DIAGNOSIS — K219 Gastro-esophageal reflux disease without esophagitis: Secondary | ICD-10-CM | POA: Diagnosis not present

## 2023-01-05 DIAGNOSIS — E1140 Type 2 diabetes mellitus with diabetic neuropathy, unspecified: Secondary | ICD-10-CM | POA: Diagnosis not present

## 2023-01-05 DIAGNOSIS — F411 Generalized anxiety disorder: Secondary | ICD-10-CM | POA: Diagnosis not present

## 2023-01-05 DIAGNOSIS — I700 Atherosclerosis of aorta: Secondary | ICD-10-CM | POA: Diagnosis not present

## 2023-01-05 DIAGNOSIS — E119 Type 2 diabetes mellitus without complications: Secondary | ICD-10-CM | POA: Diagnosis not present

## 2023-01-05 DIAGNOSIS — J9811 Atelectasis: Secondary | ICD-10-CM | POA: Diagnosis not present

## 2023-01-05 DIAGNOSIS — Z743 Need for continuous supervision: Secondary | ICD-10-CM | POA: Diagnosis not present

## 2023-01-05 DIAGNOSIS — M549 Dorsalgia, unspecified: Secondary | ICD-10-CM | POA: Diagnosis not present

## 2023-01-05 DIAGNOSIS — J90 Pleural effusion, not elsewhere classified: Secondary | ICD-10-CM | POA: Diagnosis not present

## 2023-01-05 DIAGNOSIS — I4891 Unspecified atrial fibrillation: Secondary | ICD-10-CM | POA: Diagnosis not present

## 2023-01-05 DIAGNOSIS — F32A Depression, unspecified: Secondary | ICD-10-CM | POA: Diagnosis not present

## 2023-01-05 DIAGNOSIS — I513 Intracardiac thrombosis, not elsewhere classified: Secondary | ICD-10-CM | POA: Diagnosis not present

## 2023-01-05 DIAGNOSIS — R0781 Pleurodynia: Secondary | ICD-10-CM | POA: Diagnosis not present

## 2023-01-05 DIAGNOSIS — M546 Pain in thoracic spine: Secondary | ICD-10-CM | POA: Diagnosis not present

## 2023-01-05 DIAGNOSIS — S2241XA Multiple fractures of ribs, right side, initial encounter for closed fracture: Secondary | ICD-10-CM | POA: Diagnosis not present

## 2023-01-05 DIAGNOSIS — W19XXXA Unspecified fall, initial encounter: Secondary | ICD-10-CM | POA: Diagnosis not present

## 2023-01-05 DIAGNOSIS — J189 Pneumonia, unspecified organism: Secondary | ICD-10-CM | POA: Diagnosis not present

## 2023-01-05 DIAGNOSIS — F330 Major depressive disorder, recurrent, mild: Secondary | ICD-10-CM | POA: Diagnosis not present

## 2023-01-05 DIAGNOSIS — F5101 Primary insomnia: Secondary | ICD-10-CM | POA: Diagnosis not present

## 2023-01-05 DIAGNOSIS — M810 Age-related osteoporosis without current pathological fracture: Secondary | ICD-10-CM | POA: Diagnosis not present

## 2023-01-05 DIAGNOSIS — R2689 Other abnormalities of gait and mobility: Secondary | ICD-10-CM | POA: Diagnosis not present

## 2023-01-05 DIAGNOSIS — I10 Essential (primary) hypertension: Secondary | ICD-10-CM | POA: Diagnosis not present

## 2023-01-05 DIAGNOSIS — I959 Hypotension, unspecified: Secondary | ICD-10-CM | POA: Diagnosis not present

## 2023-01-05 DIAGNOSIS — R531 Weakness: Secondary | ICD-10-CM | POA: Diagnosis not present

## 2023-01-05 DIAGNOSIS — K5900 Constipation, unspecified: Secondary | ICD-10-CM | POA: Diagnosis not present

## 2023-01-05 DIAGNOSIS — N390 Urinary tract infection, site not specified: Secondary | ICD-10-CM | POA: Diagnosis not present

## 2023-01-05 DIAGNOSIS — S2241XD Multiple fractures of ribs, right side, subsequent encounter for fracture with routine healing: Secondary | ICD-10-CM | POA: Diagnosis not present

## 2023-01-05 DIAGNOSIS — F29 Unspecified psychosis not due to a substance or known physiological condition: Secondary | ICD-10-CM | POA: Diagnosis not present

## 2023-01-06 DIAGNOSIS — J9811 Atelectasis: Secondary | ICD-10-CM | POA: Diagnosis not present

## 2023-01-06 DIAGNOSIS — I700 Atherosclerosis of aorta: Secondary | ICD-10-CM | POA: Diagnosis not present

## 2023-01-06 DIAGNOSIS — S2241XA Multiple fractures of ribs, right side, initial encounter for closed fracture: Secondary | ICD-10-CM | POA: Diagnosis not present

## 2023-01-06 DIAGNOSIS — J90 Pleural effusion, not elsewhere classified: Secondary | ICD-10-CM | POA: Diagnosis not present

## 2023-01-06 DIAGNOSIS — R0781 Pleurodynia: Secondary | ICD-10-CM | POA: Diagnosis not present

## 2023-01-07 DIAGNOSIS — J189 Pneumonia, unspecified organism: Secondary | ICD-10-CM | POA: Diagnosis not present

## 2023-01-07 DIAGNOSIS — E1140 Type 2 diabetes mellitus with diabetic neuropathy, unspecified: Secondary | ICD-10-CM | POA: Diagnosis not present

## 2023-01-07 DIAGNOSIS — K219 Gastro-esophageal reflux disease without esophagitis: Secondary | ICD-10-CM | POA: Diagnosis not present

## 2023-01-07 DIAGNOSIS — S2241XD Multiple fractures of ribs, right side, subsequent encounter for fracture with routine healing: Secondary | ICD-10-CM | POA: Diagnosis not present

## 2023-01-12 DIAGNOSIS — M549 Dorsalgia, unspecified: Secondary | ICD-10-CM | POA: Diagnosis not present

## 2023-01-12 DIAGNOSIS — E119 Type 2 diabetes mellitus without complications: Secondary | ICD-10-CM | POA: Diagnosis not present

## 2023-01-12 DIAGNOSIS — I4891 Unspecified atrial fibrillation: Secondary | ICD-10-CM | POA: Diagnosis not present

## 2023-01-12 DIAGNOSIS — I959 Hypotension, unspecified: Secondary | ICD-10-CM | POA: Diagnosis not present

## 2023-01-14 DIAGNOSIS — J189 Pneumonia, unspecified organism: Secondary | ICD-10-CM | POA: Diagnosis not present

## 2023-01-14 DIAGNOSIS — K219 Gastro-esophageal reflux disease without esophagitis: Secondary | ICD-10-CM | POA: Diagnosis not present

## 2023-01-14 DIAGNOSIS — S2241XD Multiple fractures of ribs, right side, subsequent encounter for fracture with routine healing: Secondary | ICD-10-CM | POA: Diagnosis not present

## 2023-01-14 DIAGNOSIS — E1140 Type 2 diabetes mellitus with diabetic neuropathy, unspecified: Secondary | ICD-10-CM | POA: Diagnosis not present

## 2023-01-21 DIAGNOSIS — D649 Anemia, unspecified: Secondary | ICD-10-CM | POA: Diagnosis not present

## 2023-01-21 DIAGNOSIS — R531 Weakness: Secondary | ICD-10-CM | POA: Diagnosis not present

## 2023-01-21 DIAGNOSIS — E119 Type 2 diabetes mellitus without complications: Secondary | ICD-10-CM | POA: Diagnosis not present

## 2023-01-21 DIAGNOSIS — I499 Cardiac arrhythmia, unspecified: Secondary | ICD-10-CM | POA: Diagnosis not present

## 2023-01-21 DIAGNOSIS — I350 Nonrheumatic aortic (valve) stenosis: Secondary | ICD-10-CM | POA: Diagnosis not present

## 2023-01-21 DIAGNOSIS — M6281 Muscle weakness (generalized): Secondary | ICD-10-CM | POA: Diagnosis not present

## 2023-01-21 DIAGNOSIS — N201 Calculus of ureter: Secondary | ICD-10-CM | POA: Diagnosis not present

## 2023-01-21 DIAGNOSIS — R55 Syncope and collapse: Secondary | ICD-10-CM | POA: Diagnosis not present

## 2023-01-21 DIAGNOSIS — N179 Acute kidney failure, unspecified: Secondary | ICD-10-CM | POA: Diagnosis not present

## 2023-01-21 DIAGNOSIS — I482 Chronic atrial fibrillation, unspecified: Secondary | ICD-10-CM | POA: Diagnosis not present

## 2023-01-21 DIAGNOSIS — I959 Hypotension, unspecified: Secondary | ICD-10-CM | POA: Diagnosis not present

## 2023-01-21 DIAGNOSIS — R5381 Other malaise: Secondary | ICD-10-CM | POA: Diagnosis not present

## 2023-01-21 DIAGNOSIS — I10 Essential (primary) hypertension: Secondary | ICD-10-CM | POA: Diagnosis not present

## 2023-01-21 DIAGNOSIS — G928 Other toxic encephalopathy: Secondary | ICD-10-CM | POA: Diagnosis not present

## 2023-01-21 DIAGNOSIS — E785 Hyperlipidemia, unspecified: Secondary | ICD-10-CM | POA: Diagnosis not present

## 2023-01-21 DIAGNOSIS — G9341 Metabolic encephalopathy: Secondary | ICD-10-CM | POA: Diagnosis not present

## 2023-01-21 DIAGNOSIS — R4182 Altered mental status, unspecified: Secondary | ICD-10-CM | POA: Diagnosis not present

## 2023-01-21 DIAGNOSIS — I361 Nonrheumatic tricuspid (valve) insufficiency: Secondary | ICD-10-CM | POA: Diagnosis not present

## 2023-01-21 DIAGNOSIS — R3912 Poor urinary stream: Secondary | ICD-10-CM | POA: Diagnosis not present

## 2023-01-21 DIAGNOSIS — E876 Hypokalemia: Secondary | ICD-10-CM | POA: Diagnosis not present

## 2023-01-21 DIAGNOSIS — R0602 Shortness of breath: Secondary | ICD-10-CM | POA: Diagnosis not present

## 2023-01-21 DIAGNOSIS — R001 Bradycardia, unspecified: Secondary | ICD-10-CM | POA: Diagnosis not present

## 2023-01-21 DIAGNOSIS — I2510 Atherosclerotic heart disease of native coronary artery without angina pectoris: Secondary | ICD-10-CM | POA: Diagnosis not present

## 2023-01-21 DIAGNOSIS — R279 Unspecified lack of coordination: Secondary | ICD-10-CM | POA: Diagnosis not present

## 2023-01-21 DIAGNOSIS — Z01818 Encounter for other preprocedural examination: Secondary | ICD-10-CM | POA: Diagnosis not present

## 2023-01-21 DIAGNOSIS — R0600 Dyspnea, unspecified: Secondary | ICD-10-CM | POA: Diagnosis not present

## 2023-01-21 DIAGNOSIS — E860 Dehydration: Secondary | ICD-10-CM | POA: Diagnosis not present

## 2023-01-21 DIAGNOSIS — Z7401 Bed confinement status: Secondary | ICD-10-CM | POA: Diagnosis not present

## 2023-01-21 DIAGNOSIS — R9431 Abnormal electrocardiogram [ECG] [EKG]: Secondary | ICD-10-CM | POA: Diagnosis not present

## 2023-01-21 DIAGNOSIS — I4891 Unspecified atrial fibrillation: Secondary | ICD-10-CM | POA: Diagnosis not present

## 2023-01-22 DIAGNOSIS — I361 Nonrheumatic tricuspid (valve) insufficiency: Secondary | ICD-10-CM

## 2023-01-22 DIAGNOSIS — I350 Nonrheumatic aortic (valve) stenosis: Secondary | ICD-10-CM

## 2023-01-22 DIAGNOSIS — I482 Chronic atrial fibrillation, unspecified: Secondary | ICD-10-CM

## 2023-01-22 DIAGNOSIS — I959 Hypotension, unspecified: Secondary | ICD-10-CM | POA: Diagnosis not present

## 2023-01-22 DIAGNOSIS — R001 Bradycardia, unspecified: Secondary | ICD-10-CM | POA: Diagnosis not present

## 2023-01-23 DIAGNOSIS — I350 Nonrheumatic aortic (valve) stenosis: Secondary | ICD-10-CM | POA: Diagnosis not present

## 2023-01-23 DIAGNOSIS — I361 Nonrheumatic tricuspid (valve) insufficiency: Secondary | ICD-10-CM | POA: Diagnosis not present

## 2023-01-23 DIAGNOSIS — I959 Hypotension, unspecified: Secondary | ICD-10-CM | POA: Diagnosis not present

## 2023-01-23 DIAGNOSIS — R001 Bradycardia, unspecified: Secondary | ICD-10-CM | POA: Diagnosis not present

## 2023-01-23 DIAGNOSIS — I482 Chronic atrial fibrillation, unspecified: Secondary | ICD-10-CM | POA: Diagnosis not present

## 2023-01-24 DIAGNOSIS — R001 Bradycardia, unspecified: Secondary | ICD-10-CM | POA: Diagnosis not present

## 2023-01-24 DIAGNOSIS — R0600 Dyspnea, unspecified: Secondary | ICD-10-CM | POA: Diagnosis not present

## 2023-01-24 DIAGNOSIS — I350 Nonrheumatic aortic (valve) stenosis: Secondary | ICD-10-CM | POA: Diagnosis not present

## 2023-01-24 DIAGNOSIS — I2510 Atherosclerotic heart disease of native coronary artery without angina pectoris: Secondary | ICD-10-CM | POA: Diagnosis not present

## 2023-01-24 DIAGNOSIS — I482 Chronic atrial fibrillation, unspecified: Secondary | ICD-10-CM | POA: Diagnosis not present

## 2023-01-31 DIAGNOSIS — Z01818 Encounter for other preprocedural examination: Secondary | ICD-10-CM

## 2023-02-04 DIAGNOSIS — D649 Anemia, unspecified: Secondary | ICD-10-CM | POA: Diagnosis not present

## 2023-02-04 DIAGNOSIS — L22 Diaper dermatitis: Secondary | ICD-10-CM | POA: Diagnosis not present

## 2023-02-04 DIAGNOSIS — E860 Dehydration: Secondary | ICD-10-CM | POA: Diagnosis not present

## 2023-02-04 DIAGNOSIS — I480 Paroxysmal atrial fibrillation: Secondary | ICD-10-CM | POA: Diagnosis not present

## 2023-02-04 DIAGNOSIS — Z741 Need for assistance with personal care: Secondary | ICD-10-CM | POA: Diagnosis not present

## 2023-02-04 DIAGNOSIS — E1169 Type 2 diabetes mellitus with other specified complication: Secondary | ICD-10-CM | POA: Diagnosis not present

## 2023-02-04 DIAGNOSIS — M6281 Muscle weakness (generalized): Secondary | ICD-10-CM | POA: Diagnosis not present

## 2023-02-04 DIAGNOSIS — I2510 Atherosclerotic heart disease of native coronary artery without angina pectoris: Secondary | ICD-10-CM | POA: Diagnosis not present

## 2023-02-04 DIAGNOSIS — E539 Vitamin B deficiency, unspecified: Secondary | ICD-10-CM | POA: Diagnosis not present

## 2023-02-04 DIAGNOSIS — Z9181 History of falling: Secondary | ICD-10-CM | POA: Diagnosis not present

## 2023-02-04 DIAGNOSIS — K5900 Constipation, unspecified: Secondary | ICD-10-CM | POA: Diagnosis not present

## 2023-02-04 DIAGNOSIS — R55 Syncope and collapse: Secondary | ICD-10-CM | POA: Diagnosis not present

## 2023-02-04 DIAGNOSIS — R5381 Other malaise: Secondary | ICD-10-CM | POA: Diagnosis not present

## 2023-02-04 DIAGNOSIS — E119 Type 2 diabetes mellitus without complications: Secondary | ICD-10-CM | POA: Diagnosis not present

## 2023-02-04 DIAGNOSIS — R531 Weakness: Secondary | ICD-10-CM | POA: Diagnosis not present

## 2023-02-04 DIAGNOSIS — Z7401 Bed confinement status: Secondary | ICD-10-CM | POA: Diagnosis not present

## 2023-02-04 DIAGNOSIS — G8929 Other chronic pain: Secondary | ICD-10-CM | POA: Diagnosis not present

## 2023-02-04 DIAGNOSIS — F419 Anxiety disorder, unspecified: Secondary | ICD-10-CM | POA: Diagnosis not present

## 2023-02-04 DIAGNOSIS — E785 Hyperlipidemia, unspecified: Secondary | ICD-10-CM | POA: Diagnosis not present

## 2023-02-04 DIAGNOSIS — F22 Delusional disorders: Secondary | ICD-10-CM | POA: Diagnosis not present

## 2023-02-04 DIAGNOSIS — R279 Unspecified lack of coordination: Secondary | ICD-10-CM | POA: Diagnosis not present

## 2023-02-04 DIAGNOSIS — I10 Essential (primary) hypertension: Secondary | ICD-10-CM | POA: Diagnosis not present

## 2023-02-04 DIAGNOSIS — I482 Chronic atrial fibrillation, unspecified: Secondary | ICD-10-CM | POA: Diagnosis not present

## 2023-02-04 DIAGNOSIS — I1310 Hypertensive heart and chronic kidney disease without heart failure, with stage 1 through stage 4 chronic kidney disease, or unspecified chronic kidney disease: Secondary | ICD-10-CM | POA: Diagnosis not present

## 2023-02-04 DIAGNOSIS — I350 Nonrheumatic aortic (valve) stenosis: Secondary | ICD-10-CM | POA: Diagnosis not present

## 2023-02-04 DIAGNOSIS — F32A Depression, unspecified: Secondary | ICD-10-CM | POA: Diagnosis not present

## 2023-02-04 DIAGNOSIS — D6869 Other thrombophilia: Secondary | ICD-10-CM | POA: Diagnosis not present

## 2023-02-04 DIAGNOSIS — G9341 Metabolic encephalopathy: Secondary | ICD-10-CM | POA: Diagnosis not present

## 2023-02-04 DIAGNOSIS — I4891 Unspecified atrial fibrillation: Secondary | ICD-10-CM | POA: Diagnosis not present

## 2023-02-07 DIAGNOSIS — F32A Depression, unspecified: Secondary | ICD-10-CM | POA: Diagnosis not present

## 2023-02-07 DIAGNOSIS — E539 Vitamin B deficiency, unspecified: Secondary | ICD-10-CM | POA: Diagnosis not present

## 2023-02-07 DIAGNOSIS — I480 Paroxysmal atrial fibrillation: Secondary | ICD-10-CM | POA: Diagnosis not present

## 2023-02-07 DIAGNOSIS — L22 Diaper dermatitis: Secondary | ICD-10-CM | POA: Diagnosis not present

## 2023-02-07 DIAGNOSIS — I1310 Hypertensive heart and chronic kidney disease without heart failure, with stage 1 through stage 4 chronic kidney disease, or unspecified chronic kidney disease: Secondary | ICD-10-CM | POA: Diagnosis not present

## 2023-02-07 DIAGNOSIS — I2510 Atherosclerotic heart disease of native coronary artery without angina pectoris: Secondary | ICD-10-CM | POA: Diagnosis not present

## 2023-02-07 DIAGNOSIS — E1169 Type 2 diabetes mellitus with other specified complication: Secondary | ICD-10-CM | POA: Diagnosis not present

## 2023-02-07 DIAGNOSIS — E785 Hyperlipidemia, unspecified: Secondary | ICD-10-CM | POA: Diagnosis not present

## 2023-02-07 DIAGNOSIS — F419 Anxiety disorder, unspecified: Secondary | ICD-10-CM | POA: Diagnosis not present

## 2023-02-07 DIAGNOSIS — D649 Anemia, unspecified: Secondary | ICD-10-CM | POA: Diagnosis not present

## 2023-02-07 DIAGNOSIS — D6869 Other thrombophilia: Secondary | ICD-10-CM | POA: Diagnosis not present

## 2023-02-07 DIAGNOSIS — E119 Type 2 diabetes mellitus without complications: Secondary | ICD-10-CM | POA: Diagnosis not present

## 2023-02-08 DIAGNOSIS — K5900 Constipation, unspecified: Secondary | ICD-10-CM | POA: Diagnosis not present

## 2023-02-11 DIAGNOSIS — E539 Vitamin B deficiency, unspecified: Secondary | ICD-10-CM | POA: Diagnosis not present

## 2023-02-11 DIAGNOSIS — K5900 Constipation, unspecified: Secondary | ICD-10-CM | POA: Diagnosis not present

## 2023-02-11 DIAGNOSIS — I2510 Atherosclerotic heart disease of native coronary artery without angina pectoris: Secondary | ICD-10-CM | POA: Diagnosis not present

## 2023-02-11 DIAGNOSIS — F419 Anxiety disorder, unspecified: Secondary | ICD-10-CM | POA: Diagnosis not present

## 2023-02-11 DIAGNOSIS — D6869 Other thrombophilia: Secondary | ICD-10-CM | POA: Diagnosis not present

## 2023-02-11 DIAGNOSIS — I1310 Hypertensive heart and chronic kidney disease without heart failure, with stage 1 through stage 4 chronic kidney disease, or unspecified chronic kidney disease: Secondary | ICD-10-CM | POA: Diagnosis not present

## 2023-02-11 DIAGNOSIS — E1169 Type 2 diabetes mellitus with other specified complication: Secondary | ICD-10-CM | POA: Diagnosis not present

## 2023-02-11 DIAGNOSIS — E785 Hyperlipidemia, unspecified: Secondary | ICD-10-CM | POA: Diagnosis not present

## 2023-02-11 DIAGNOSIS — F32A Depression, unspecified: Secondary | ICD-10-CM | POA: Diagnosis not present

## 2023-02-15 DIAGNOSIS — G8929 Other chronic pain: Secondary | ICD-10-CM | POA: Diagnosis not present

## 2023-02-15 DIAGNOSIS — F22 Delusional disorders: Secondary | ICD-10-CM | POA: Diagnosis not present

## 2023-02-15 DIAGNOSIS — F419 Anxiety disorder, unspecified: Secondary | ICD-10-CM | POA: Diagnosis not present

## 2023-02-15 DIAGNOSIS — K5900 Constipation, unspecified: Secondary | ICD-10-CM | POA: Diagnosis not present

## 2023-02-15 DIAGNOSIS — F32A Depression, unspecified: Secondary | ICD-10-CM | POA: Diagnosis not present

## 2023-02-16 DIAGNOSIS — G9341 Metabolic encephalopathy: Secondary | ICD-10-CM | POA: Diagnosis not present

## 2023-02-16 DIAGNOSIS — Z741 Need for assistance with personal care: Secondary | ICD-10-CM | POA: Diagnosis not present

## 2023-02-16 DIAGNOSIS — M6281 Muscle weakness (generalized): Secondary | ICD-10-CM | POA: Diagnosis not present

## 2023-02-16 DIAGNOSIS — R55 Syncope and collapse: Secondary | ICD-10-CM | POA: Diagnosis not present

## 2023-02-17 DIAGNOSIS — E119 Type 2 diabetes mellitus without complications: Secondary | ICD-10-CM | POA: Diagnosis not present

## 2023-02-17 DIAGNOSIS — L22 Diaper dermatitis: Secondary | ICD-10-CM | POA: Diagnosis not present

## 2023-02-17 DIAGNOSIS — D649 Anemia, unspecified: Secondary | ICD-10-CM | POA: Diagnosis not present

## 2023-02-17 DIAGNOSIS — E1169 Type 2 diabetes mellitus with other specified complication: Secondary | ICD-10-CM | POA: Diagnosis not present

## 2023-02-18 DIAGNOSIS — Z9181 History of falling: Secondary | ICD-10-CM | POA: Diagnosis not present

## 2023-02-18 DIAGNOSIS — R531 Weakness: Secondary | ICD-10-CM | POA: Diagnosis not present

## 2023-02-20 DIAGNOSIS — Z7984 Long term (current) use of oral hypoglycemic drugs: Secondary | ICD-10-CM | POA: Diagnosis not present

## 2023-02-20 DIAGNOSIS — R339 Retention of urine, unspecified: Secondary | ICD-10-CM | POA: Diagnosis not present

## 2023-02-20 DIAGNOSIS — I10 Essential (primary) hypertension: Secondary | ICD-10-CM | POA: Diagnosis not present

## 2023-02-20 DIAGNOSIS — E118 Type 2 diabetes mellitus with unspecified complications: Secondary | ICD-10-CM | POA: Diagnosis not present

## 2023-02-20 DIAGNOSIS — R079 Chest pain, unspecified: Secondary | ICD-10-CM | POA: Diagnosis not present

## 2023-02-20 DIAGNOSIS — K5900 Constipation, unspecified: Secondary | ICD-10-CM | POA: Diagnosis not present

## 2023-02-20 DIAGNOSIS — R109 Unspecified abdominal pain: Secondary | ICD-10-CM | POA: Diagnosis not present

## 2023-02-20 DIAGNOSIS — I350 Nonrheumatic aortic (valve) stenosis: Secondary | ICD-10-CM | POA: Diagnosis not present

## 2023-02-20 DIAGNOSIS — I4891 Unspecified atrial fibrillation: Secondary | ICD-10-CM | POA: Diagnosis not present

## 2023-02-20 DIAGNOSIS — Z993 Dependence on wheelchair: Secondary | ICD-10-CM | POA: Diagnosis not present

## 2023-02-20 DIAGNOSIS — M549 Dorsalgia, unspecified: Secondary | ICD-10-CM | POA: Diagnosis not present

## 2023-02-20 DIAGNOSIS — R091 Pleurisy: Secondary | ICD-10-CM | POA: Diagnosis not present

## 2023-02-20 DIAGNOSIS — K5730 Diverticulosis of large intestine without perforation or abscess without bleeding: Secondary | ICD-10-CM | POA: Diagnosis not present

## 2023-02-20 DIAGNOSIS — I482 Chronic atrial fibrillation, unspecified: Secondary | ICD-10-CM | POA: Diagnosis not present

## 2023-02-20 DIAGNOSIS — Z7982 Long term (current) use of aspirin: Secondary | ICD-10-CM | POA: Diagnosis not present

## 2023-02-20 DIAGNOSIS — R9431 Abnormal electrocardiogram [ECG] [EKG]: Secondary | ICD-10-CM | POA: Diagnosis not present

## 2023-02-21 DIAGNOSIS — M25552 Pain in left hip: Secondary | ICD-10-CM | POA: Diagnosis not present

## 2023-02-21 DIAGNOSIS — E118 Type 2 diabetes mellitus with unspecified complications: Secondary | ICD-10-CM | POA: Diagnosis not present

## 2023-02-21 DIAGNOSIS — R109 Unspecified abdominal pain: Secondary | ICD-10-CM | POA: Diagnosis not present

## 2023-02-21 DIAGNOSIS — K5900 Constipation, unspecified: Secondary | ICD-10-CM | POA: Diagnosis not present

## 2023-02-21 DIAGNOSIS — I482 Chronic atrial fibrillation, unspecified: Secondary | ICD-10-CM | POA: Diagnosis not present

## 2023-02-22 DIAGNOSIS — L89152 Pressure ulcer of sacral region, stage 2: Secondary | ICD-10-CM | POA: Diagnosis not present

## 2023-02-22 DIAGNOSIS — D6859 Other primary thrombophilia: Secondary | ICD-10-CM | POA: Diagnosis not present

## 2023-02-22 DIAGNOSIS — S2241XD Multiple fractures of ribs, right side, subsequent encounter for fracture with routine healing: Secondary | ICD-10-CM | POA: Diagnosis not present

## 2023-02-22 DIAGNOSIS — I482 Chronic atrial fibrillation, unspecified: Secondary | ICD-10-CM | POA: Diagnosis not present

## 2023-02-22 DIAGNOSIS — E119 Type 2 diabetes mellitus without complications: Secondary | ICD-10-CM | POA: Diagnosis not present

## 2023-02-22 DIAGNOSIS — I2510 Atherosclerotic heart disease of native coronary artery without angina pectoris: Secondary | ICD-10-CM | POA: Diagnosis not present

## 2023-02-22 DIAGNOSIS — I1310 Hypertensive heart and chronic kidney disease without heart failure, with stage 1 through stage 4 chronic kidney disease, or unspecified chronic kidney disease: Secondary | ICD-10-CM | POA: Diagnosis not present

## 2023-02-22 DIAGNOSIS — S7290XD Unspecified fracture of unspecified femur, subsequent encounter for closed fracture with routine healing: Secondary | ICD-10-CM | POA: Diagnosis not present

## 2023-02-22 DIAGNOSIS — N189 Chronic kidney disease, unspecified: Secondary | ICD-10-CM | POA: Diagnosis not present

## 2023-02-27 DIAGNOSIS — M79604 Pain in right leg: Secondary | ICD-10-CM | POA: Diagnosis not present

## 2023-02-27 DIAGNOSIS — I4891 Unspecified atrial fibrillation: Secondary | ICD-10-CM | POA: Diagnosis not present

## 2023-02-27 DIAGNOSIS — Z743 Need for continuous supervision: Secondary | ICD-10-CM | POA: Diagnosis not present

## 2023-02-27 DIAGNOSIS — E872 Acidosis, unspecified: Secondary | ICD-10-CM | POA: Diagnosis not present

## 2023-02-27 DIAGNOSIS — M545 Low back pain, unspecified: Secondary | ICD-10-CM | POA: Diagnosis not present

## 2023-02-27 DIAGNOSIS — R079 Chest pain, unspecified: Secondary | ICD-10-CM | POA: Diagnosis not present

## 2023-02-27 DIAGNOSIS — M79605 Pain in left leg: Secondary | ICD-10-CM | POA: Diagnosis not present

## 2023-02-27 DIAGNOSIS — I252 Old myocardial infarction: Secondary | ICD-10-CM | POA: Diagnosis not present

## 2023-02-27 DIAGNOSIS — K219 Gastro-esophageal reflux disease without esophagitis: Secondary | ICD-10-CM | POA: Diagnosis not present

## 2023-02-27 DIAGNOSIS — Z7901 Long term (current) use of anticoagulants: Secondary | ICD-10-CM | POA: Diagnosis not present

## 2023-02-27 DIAGNOSIS — G8929 Other chronic pain: Secondary | ICD-10-CM | POA: Diagnosis not present

## 2023-02-27 DIAGNOSIS — E876 Hypokalemia: Secondary | ICD-10-CM | POA: Diagnosis not present

## 2023-02-27 DIAGNOSIS — I10 Essential (primary) hypertension: Secondary | ICD-10-CM | POA: Diagnosis not present

## 2023-02-27 DIAGNOSIS — R9431 Abnormal electrocardiogram [ECG] [EKG]: Secondary | ICD-10-CM | POA: Diagnosis not present

## 2023-02-28 DIAGNOSIS — G8929 Other chronic pain: Secondary | ICD-10-CM | POA: Diagnosis not present

## 2023-02-28 DIAGNOSIS — M545 Low back pain, unspecified: Secondary | ICD-10-CM | POA: Diagnosis not present

## 2023-02-28 DIAGNOSIS — M79604 Pain in right leg: Secondary | ICD-10-CM | POA: Diagnosis not present

## 2023-03-11 DIAGNOSIS — L89301 Pressure ulcer of unspecified buttock, stage 1: Secondary | ICD-10-CM | POA: Diagnosis not present

## 2023-03-16 DIAGNOSIS — R0989 Other specified symptoms and signs involving the circulatory and respiratory systems: Secondary | ICD-10-CM | POA: Diagnosis not present

## 2023-03-16 DIAGNOSIS — Z79899 Other long term (current) drug therapy: Secondary | ICD-10-CM | POA: Diagnosis not present

## 2023-03-16 DIAGNOSIS — N202 Calculus of kidney with calculus of ureter: Secondary | ICD-10-CM | POA: Diagnosis not present

## 2023-03-16 DIAGNOSIS — I771 Stricture of artery: Secondary | ICD-10-CM | POA: Diagnosis not present

## 2023-03-16 DIAGNOSIS — J9811 Atelectasis: Secondary | ICD-10-CM | POA: Diagnosis not present

## 2023-03-16 DIAGNOSIS — K5730 Diverticulosis of large intestine without perforation or abscess without bleeding: Secondary | ICD-10-CM | POA: Diagnosis not present

## 2023-03-16 DIAGNOSIS — K5900 Constipation, unspecified: Secondary | ICD-10-CM | POA: Diagnosis not present

## 2023-03-16 DIAGNOSIS — K8689 Other specified diseases of pancreas: Secondary | ICD-10-CM | POA: Diagnosis not present

## 2023-03-20 DIAGNOSIS — K5900 Constipation, unspecified: Secondary | ICD-10-CM | POA: Diagnosis not present

## 2023-03-20 DIAGNOSIS — R109 Unspecified abdominal pain: Secondary | ICD-10-CM | POA: Diagnosis not present
